# Patient Record
Sex: Female | Born: 1975 | Race: White | Hispanic: No | Marital: Married | Smoking: Never smoker
Health system: Southern US, Community
[De-identification: ages and names within clinical notes are randomized; demographics above are authoritative.]

## PROBLEM LIST (undated history)

## (undated) DIAGNOSIS — R011 Cardiac murmur, unspecified: Secondary | ICD-10-CM

## (undated) DIAGNOSIS — J309 Allergic rhinitis, unspecified: Secondary | ICD-10-CM

## (undated) DIAGNOSIS — I959 Hypotension, unspecified: Secondary | ICD-10-CM

## (undated) DIAGNOSIS — F329 Major depressive disorder, single episode, unspecified: Secondary | ICD-10-CM

## (undated) DIAGNOSIS — D509 Iron deficiency anemia, unspecified: Secondary | ICD-10-CM

## (undated) DIAGNOSIS — K589 Irritable bowel syndrome without diarrhea: Secondary | ICD-10-CM

## (undated) DIAGNOSIS — B001 Herpesviral vesicular dermatitis: Secondary | ICD-10-CM

## (undated) DIAGNOSIS — E785 Hyperlipidemia, unspecified: Secondary | ICD-10-CM

## (undated) DIAGNOSIS — N281 Cyst of kidney, acquired: Secondary | ICD-10-CM

## (undated) DIAGNOSIS — Z9189 Other specified personal risk factors, not elsewhere classified: Secondary | ICD-10-CM

## (undated) DIAGNOSIS — F3289 Other specified depressive episodes: Secondary | ICD-10-CM

## (undated) DIAGNOSIS — Z973 Presence of spectacles and contact lenses: Secondary | ICD-10-CM

## (undated) DIAGNOSIS — E669 Obesity, unspecified: Secondary | ICD-10-CM

## (undated) DIAGNOSIS — M129 Arthropathy, unspecified: Secondary | ICD-10-CM

## (undated) HISTORY — PX: BUNIONECTOMY: SHX129

## (undated) HISTORY — DX: Cardiac murmur, unspecified: R01.1

## (undated) HISTORY — DX: Irritable bowel syndrome, unspecified: K58.9

## (undated) HISTORY — DX: Arthropathy, unspecified: M12.9

## (undated) HISTORY — PX: NO PAST SURGERIES: SHX2092

## (undated) HISTORY — DX: Major depressive disorder, single episode, unspecified: F32.9

## (undated) HISTORY — DX: Allergic rhinitis, unspecified: J30.9

## (undated) HISTORY — DX: Hyperlipidemia, unspecified: E78.5

## (undated) HISTORY — DX: Other specified depressive episodes: F32.89

## (undated) HISTORY — DX: Other specified personal risk factors, not elsewhere classified: Z91.89

---
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
## 2007-09-18 ENCOUNTER — Encounter: Payer: Self-pay | Admitting: Internal Medicine

## 2008-11-11 ENCOUNTER — Encounter: Payer: Self-pay | Admitting: Internal Medicine

## 2009-04-01 ENCOUNTER — Encounter: Payer: Self-pay | Admitting: Internal Medicine

## 2009-04-01 LAB — CONVERTED CEMR LAB

## 2010-03-30 ENCOUNTER — Encounter: Payer: Self-pay | Admitting: Internal Medicine

## 2010-03-30 ENCOUNTER — Ambulatory Visit (INDEPENDENT_AMBULATORY_CARE_PROVIDER_SITE_OTHER): Payer: BC Managed Care – PPO | Admitting: Internal Medicine

## 2010-03-30 DIAGNOSIS — J309 Allergic rhinitis, unspecified: Secondary | ICD-10-CM | POA: Insufficient documentation

## 2010-03-30 DIAGNOSIS — R51 Headache: Secondary | ICD-10-CM

## 2010-03-30 DIAGNOSIS — M129 Arthropathy, unspecified: Secondary | ICD-10-CM | POA: Insufficient documentation

## 2010-03-30 DIAGNOSIS — E785 Hyperlipidemia, unspecified: Secondary | ICD-10-CM | POA: Insufficient documentation

## 2010-03-30 DIAGNOSIS — Z9189 Other specified personal risk factors, not elsewhere classified: Secondary | ICD-10-CM | POA: Insufficient documentation

## 2010-03-30 DIAGNOSIS — F329 Major depressive disorder, single episode, unspecified: Secondary | ICD-10-CM

## 2010-03-30 DIAGNOSIS — Z87448 Personal history of other diseases of urinary system: Secondary | ICD-10-CM | POA: Insufficient documentation

## 2010-03-30 DIAGNOSIS — Z309 Encounter for contraceptive management, unspecified: Secondary | ICD-10-CM

## 2010-03-30 DIAGNOSIS — F320 Major depressive disorder, single episode, mild: Secondary | ICD-10-CM | POA: Insufficient documentation

## 2010-04-02 ENCOUNTER — Telehealth (INDEPENDENT_AMBULATORY_CARE_PROVIDER_SITE_OTHER): Payer: Self-pay | Admitting: *Deleted

## 2010-04-08 ENCOUNTER — Encounter: Payer: Self-pay | Admitting: Internal Medicine

## 2010-04-09 NOTE — Progress Notes (Signed)
  Phone Note Other Incoming   Request: Send information Summary of Call: Records received from Dr. Para March. 12 pages forwarded to Dr. Felicity Coyer for review.

## 2010-04-09 NOTE — Assessment & Plan Note (Signed)
Summary: NEW/BCBS/PKG/#/STC   Vital Signs:  Patient profile:   35 year old female Height:      63 inches (160.02 cm) Weight:      162.0 pounds (73.64 kg) BMI:     28.80 O2 Sat:      97 % on Room air Temp:     98.3 degrees F (36.83 degrees C) oral Pulse rate:   81 / minute BP sitting:   118 / 84  (left arm) Cuff size:   regular  Vitals Entered By: Orlan Leavens RMA (March 30, 2010 3:11 PM)  O2 Flow:  Room air CC: New patient Is Patient Diabetic? No Pain Assessment Patient in pain? no        Primary Care Provider:  Newt Lukes MD  CC:  New patient.  History of Present Illness: new pt to me, here to est care prev at [facility] [place] [facility] (dr. Para March)  contraception needs - does not wish to take any hormone tx due to emotional irritability on BCP - has decided not to pursue pregnancy, no children desired - ?mechanical or surgical options for birth control - request 2nd opinion from new gyn on same  allg rhinits - follows with allg for same - reports compliance with ongoing medical treatment and no changes in medication dose or frequency. denies adverse side effects related to current therapy.   hx migraines/cluster headaches - prev on med saple for control of same but no rx ever filled - no recent falres but woul like refill available to use if needed  depression hx- on med tx for same temporary 2009 when physically separated from spouse (still in [place] after pt move to [place]), no rx since - has seen counselor for asst as needed - no SI/HI but current stressors due to contraception issues as above  Preventive Screening-Counseling & Management  Alcohol-Tobacco     Alcohol drinks/day: <1     Alcohol Counseling: not indicated; use of alcohol is not excessive or problematic     Smoking Status: never     Tobacco Counseling: not indicated; no tobacco use  Caffeine-Diet-Exercise     Caffeine Counseling: not indicated; caffeine use is not excessive or problematic  Nutrition Referrals: no     Does Patient Exercise: yes     Exercise Counseling: to improve exercise regimen     Depression Counseling: not indicated; screening negative for depression  Safety-Violence-Falls     Seat Belt Counseling: not indicated; patient wears seat belts     Helmet Counseling: not indicated; patient wears helmet when riding bicycle/motocycle     Firearm Counseling: not applicable     Violence Counseling: not indicated; no violence risk noted     Fall Risk Counseling: not indicated; no significant falls noted  Clinical Review Panels:  Prevention   Last Pap Smear:  Interpretation Result:Negative for intraepithelial Lesion or Malignancy.    (04/01/2009)   Current Medications (verified): 1)  Xyzal 2.5 Mg/34ml Soln (Levocetirizine Dihydrochloride) .... Take 1 By Mouth Once Daily 2)  Nasonex 50 Mcg/act Susp (Mometasone Furoate) .... Use 2 Spray Each Nostril Every Day  Allergies (verified): 1)  ! 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 I have reviewed all other systems and they were negative.   Physical Exam  General:  overweight-appearing.  alert, well-developed, well-nourished, and cooperative to examination.    Eyes:  left inward strabismus  - wears glasses - vision grossly intact; pupils equal, round and reactive to light.  conjunctiva and lids normal.    Ears:  R ear normal and L ear normal.   Mouth:  teeth and gums in good repair; mucous membranes moist, without lesions or ulcers. oropharynx clear without exudate, no erythema.  Neck:  supple, full ROM, no masses, no thyromegaly; no thyroid nodules or tenderness. no JVD or carotid bruits.   Lungs:  normal respiratory effort, no intercostal retractions or use of accessory muscles; normal breath sounds bilaterally - no crackles and no wheezes.    Heart:  normal rate, regular rhythm,  soft 2/6 syst murmur, and no rub. BLE without edema.  Abdomen:  soft, non-tender, normal bowel sounds, no distention; no masses and no appreciable hepatomegaly or splenomegaly.   Genitalia:  defer gyn Msk:  No deformity or scoliosis noted of thoracic or lumbar spine.   Neurologic:  alert & oriented X3 and cranial nerves II-XII symetrically intact.  strength normal in all extremities, sensation intact to light touch, and gait normal. speech fluent without dysarthria or aphasia; follows commands with good comprehension.  Skin:  no rashes, vesicles, ulcers, or erythema. No nodules or irregularity to palpation.  Psych:  reserved but oriented X3, memory intact for recent and remote, normally interactive, good eye contact, not anxious appearing, min depressed appearing, and not agitated.      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 Take 1 by mouth once daily    Nasonex 50 Mcg/act Susp (Mometasone furoate) ..... Use 2 spray each nostril every day Time spent with patient 35 minutes, more than 50% of this time was spent counseling patient on depression, contraception and review of prior med history -   Complete Medication List: 1)  Xyzal 2.5 Mg/65ml Soln (Levocetirizine dihydrochloride) .... Take 1 by mouth once daily 2)  Nasonex 50 Mcg/act Susp (Mometasone furoate) .... Use 2 spray each nostril every day  Patient Instructions: 1)  it was good to see you today.  2)  we'll make referral to gynecology to discuss nonpharm contraception options. Our office will contact you regarding this appointment once made.  3)  will send for records  from dr. Para March at [place] to review  4)  Please schedule a follow-up appointment in 3-6 months to continue review, call sooner if problems.    Orders Added: 1)  New Patient Level III [99203] 2)  Gynecologic Referral [Gyn]     Pap Smear  Procedure date:  04/01/2009  Findings:      Interpretation Result:Negative for intraepithelial Lesion or Malignancy.

## 2010-04-14 NOTE — Letter (Signed)
Summary: [facility]   Imported By: Sherian Rein 04/08/2010 11:11:13  _____________________________________________________________________  External Attachment:    Type:   Image     Comment:   External Document

## 2010-04-14 NOTE — Letter (Signed)
Summary: NP Rockwell Germany MD  NP Rockwell Germany MD   Imported By: Sherian Rein 04/08/2010 11:17:26  _____________________________________________________________________  External Attachment:    Type:   Image     Comment:   External Document

## 2010-04-21 NOTE — Letter (Signed)
Summary: Beather Arbour MD  Beather Arbour MD   Imported By: Lennie Odor 04/14/2010 10:25:27  _____________________________________________________________________  External Attachment:    Type:   Image     Comment:   External Document

## 2010-06-18 ENCOUNTER — Encounter: Payer: Self-pay | Admitting: Internal Medicine

## 2010-06-25 ENCOUNTER — Ambulatory Visit: Payer: BC Managed Care – PPO | Admitting: Internal Medicine

## 2010-07-03 ENCOUNTER — Ambulatory Visit (INDEPENDENT_AMBULATORY_CARE_PROVIDER_SITE_OTHER): Payer: BC Managed Care – PPO | Admitting: Internal Medicine

## 2010-07-03 ENCOUNTER — Encounter: Payer: Self-pay | Admitting: Internal Medicine

## 2010-07-03 VITALS — BP 108/62 | HR 77 | Temp 98.5°F | Resp 14 | Wt 156.8 lb

## 2010-07-03 DIAGNOSIS — I340 Nonrheumatic mitral (valve) insufficiency: Secondary | ICD-10-CM | POA: Insufficient documentation

## 2010-07-03 DIAGNOSIS — F329 Major depressive disorder, single episode, unspecified: Secondary | ICD-10-CM

## 2010-07-03 DIAGNOSIS — R011 Cardiac murmur, unspecified: Secondary | ICD-10-CM

## 2010-07-03 NOTE — Assessment & Plan Note (Signed)
Stable symptoms - continued insomnia - declines rx tx (ambein caused night terrors in past) - continue melatonin prn May be improved with job change (in effect today 07/03/10 at [employer] - study abroad project) Uses counseling as needed

## 2010-07-03 NOTE — Assessment & Plan Note (Signed)
hx same since childhood, "didn't outgrow" but unsure of dx - reports echo and stress test to be done every 3-44yr, last done in [place] (2008) - no chest pain, dyspnea on exertion or near syncope symptoms - refer for echo now

## 2010-07-03 NOTE — Progress Notes (Signed)
  Subjective:    Patient ID: Maria Frank, female    DOB: 08/31/1975, 35 y.o.   MRN: [id number]  HPI Here for follow up - reviewed chronic medical issues  contraception needs - working with gyn on same - ?IUD in planned fall 2012 as does not wish to take any hormone tx due to emotional irritability on BCP - has decided not to pursue pregnancy, no children desired -   allg rhinits - follows with allg for same - reports compliance with ongoing medical treatment and no changes in medication dose or frequency. denies adverse side effects related to current therapy.   hx migraines/cluster headaches - no recent flares but keeps refill available to use if needed   depression hx- on med tx for same temporary 2009 when physically separated from spouse (still in [place] after pt move to [place]), no rx since - has seen counselor for asst as needed - no SI/HI  - primary problem now with insomnia - hx ambien use caused night terrors - uses melatonin as needed  ?due for echo to follow up murmur - not done since 2008  Past Medical History  Diagnosis Date  . ARTHRITIS   . CHICKENPOX, HX OF   . Migraine   . IBS (irritable bowel syndrome)   . ALLERGIC RHINITIS   . HYPERLIPIDEMIA   . DEPRESSION    Review of Systems  Constitutional: Negative for fatigue.  Respiratory: Negative for cough and shortness of breath.   Cardiovascular: Negative for chest pain.  Neurological: Negative for headaches.       Objective:   Physical Exam BP 108/62  Pulse 77  Temp(Src) 98.5 F (36.9 C) (Oral)  Resp 14  Wt 156 lb 12 oz (71.101 kg)  SpO2 99%  LMP 06/12/2010 Physical Exam  Constitutional: She is oriented to person, place, and time. She appears well-developed and well-nourished. No distress.  Neck: Normal range of motion. Neck supple. No JVD present. No thyromegaly present.  Cardiovascular: Normal rate, regular rhythm and normal heart sounds.  2/6 systolic murmur heard. No BLE edema. Pulmonary/Chest: Effort normal  and breath sounds normal. No respiratory distress. She has no wheezes.  Psychiatric: She has a normal mood and affect. Her behavior is normal. Judgment and thought content normal.          Assessment & Plan:  See problem list. Medications and labs reviewed today.

## 2010-07-03 NOTE — Patient Instructions (Signed)
It was good to see you today. we'll make referral for echocardiogram for your heart murmur . Our office will contact you regarding appointment(s) once made. Please schedule followup in 3-6 months for physical/labs, call sooner if problems.

## 2010-07-16 ENCOUNTER — Other Ambulatory Visit (HOSPITAL_COMMUNITY): Payer: BC Managed Care – PPO | Admitting: Radiology

## 2010-07-20 ENCOUNTER — Ambulatory Visit (HOSPITAL_COMMUNITY): Payer: BC Managed Care – PPO | Attending: Internal Medicine | Admitting: Radiology

## 2010-07-20 DIAGNOSIS — R011 Cardiac murmur, unspecified: Secondary | ICD-10-CM

## 2010-07-20 DIAGNOSIS — I079 Rheumatic tricuspid valve disease, unspecified: Secondary | ICD-10-CM | POA: Insufficient documentation

## 2010-07-20 DIAGNOSIS — I059 Rheumatic mitral valve disease, unspecified: Secondary | ICD-10-CM | POA: Insufficient documentation

## 2010-07-20 DIAGNOSIS — E785 Hyperlipidemia, unspecified: Secondary | ICD-10-CM | POA: Insufficient documentation

## 2010-10-01 ENCOUNTER — Telehealth: Payer: Self-pay | Admitting: *Deleted

## 2010-10-01 DIAGNOSIS — Z0000 Encounter for general adult medical examination without abnormal findings: Secondary | ICD-10-CM

## 2010-10-01 NOTE — Telephone Encounter (Signed)
Need cpx labs entered in EPIC 

## 2010-10-17 ENCOUNTER — Other Ambulatory Visit: Payer: Self-pay | Admitting: Internal Medicine

## 2010-10-17 DIAGNOSIS — Z0000 Encounter for general adult medical examination without abnormal findings: Secondary | ICD-10-CM

## 2010-10-27 ENCOUNTER — Other Ambulatory Visit: Payer: BC Managed Care – PPO

## 2010-10-30 ENCOUNTER — Encounter: Payer: Self-pay | Admitting: Internal Medicine

## 2010-10-30 ENCOUNTER — Ambulatory Visit (INDEPENDENT_AMBULATORY_CARE_PROVIDER_SITE_OTHER): Payer: BC Managed Care – PPO | Admitting: Internal Medicine

## 2010-10-30 VITALS — BP 120/82 | HR 72 | Temp 98.3°F | Ht 62.0 in | Wt 153.1 lb

## 2010-10-30 DIAGNOSIS — Z23 Encounter for immunization: Secondary | ICD-10-CM

## 2010-10-30 DIAGNOSIS — Z0000 Encounter for general adult medical examination without abnormal findings: Secondary | ICD-10-CM

## 2010-10-30 NOTE — Progress Notes (Signed)
Subjective:    Patient ID: Maria Frank, female    DOB: Nov 28, 1975, 35 y.o.   MRN: [id number]  HPI  patient is here today for annual physical. Patient feels well and has no complaints.  Also reviewed chronic medical issues  contraception needs - working with gyn on same - IUD in planned fall 2012 as does not wish to take any hormone tx due to emotional irritability on BCP - has decided not to pursue pregnancy, no children desired -   allg rhinits - follows with allg for same - reports compliance with ongoing medical treatment and no changes in medication dose or frequency. denies adverse side effects related to current therapy.   hx migraines/cluster headaches - no recent flares but keeps refill available to use if needed   depression hx- on med tx for same temporary 2009 when physically separated from spouse (still in [place] after pt move to [place]), no rx since - has seen counselor for asst as needed - no SI/HI  - primary problem now with insomnia - hx ambien use caused night terrors - uses melatonin as needed  cardaic murmur - echo 07/2010 benign: Impressions: Normal LV size and function, EF 60-65%. Normal RV size and systolic function. Trivial MR. No significant valvular abnormalities.    Past Medical History  Diagnosis Date  . ARTHRITIS   . CHICKENPOX, HX OF   . Migraine   . IBS (irritable bowel syndrome)   . ALLERGIC RHINITIS   . HYPERLIPIDEMIA   . DEPRESSION    Family History  Problem Relation Age of Onset  . Hypertension Father   . Hypertension Paternal Grandfather   . Arthritis Other     parent & grandparents   History  Substance Use Topics  . Smoking status: Never Smoker   . Smokeless tobacco: Not on file   Comment: Married, lives with spouse. Originally from [place], in [place] since 2008. 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 Alcohol Use: Yes     Social    Review of Systems  Constitutional: Negative for fatigue.  Respiratory: Negative for cough and shortness of  breath.   Cardiovascular: Negative for chest pain.  Neurological: Negative for headaches.  Gastrointestinal: Negative for abdominal pain.  Musculoskeletal: Negative for gait problem.  Skin: Negative for rash.  No other specific complaints in a complete review of systems (except as listed in HPI above).      Objective:   Physical Exam  BP 120/82  Pulse 72  Temp(Src) 98.3 F (36.8 C) (Oral)  Ht 5\' 2"  (1.575 m)  Wt 153 lb 1.9 oz (69.455 kg)  BMI 28.01 kg/m2  SpO2 97% Wt Readings from Last 3 Encounters:  10/30/10 153 lb 1.9 oz (69.455 kg)  07/03/10 156 lb 12 oz (71.101 kg)  03/30/10 162 lb (73.483 kg)    Constitutional: She is overweight (mild); appears well-developed and well-nourished. No distress.  HENT: Head: Normocephalic and atraumatic. Ears: B TMs ok, no erythema or effusion; Nose: Nose normal.  Mouth/Throat: Oropharynx is clear and moist. No oropharyngeal exudate.  Eyes: wears corrective lenses, Conjunctivae and EOM are normal. Pupils are equal, round, and reactive to light. No scleral icterus.  Neck: Normal range of motion. Neck supple. No JVD or LAD present. No thyromegaly present.  Cardiovascular: Normal rate, regular rhythm and normal heart sounds.  Chronic soft 2/6 syst murmur heard. No BLE edema. Pulmonary/Chest: Effort normal and breath sounds normal. No respiratory distress. She has no wheezes.  Abdominal: Soft. Bowel sounds  are normal. She exhibits no distension. There is no tenderness. no masses GU/pelvic: defer to gyn Musculoskeletal: Normal range of motion, no joint effusions. No gross deformities Neurological: She is alert and oriented to person, place, and time. No cranial nerve deficit. Coordination normal.  Skin: Skin is warm and dry. No rash noted. No erythema.  Psychiatric: She has a normal mood and affect. Her behavior is normal. Judgment and thought content normal.   No results found for this basename: WBC, HGB, HCT, PLT, CHOL, TRIG, HDL, LDLDIRECT,  ALT, AST, NA, K, CL, CREATININE, BUN, CO2, TSH, PSA, INR, GLUF, HGBA1C, MICROALBUR       Assessment & Plan:  CPX - v70.0 - Patient has been counseled on age-appropriate routine health concerns for screening and prevention. These are reviewed and up-to-date. Immunizations are up-to-date or declined. Labs ordered and will be reviewed.

## 2010-10-30 NOTE — Patient Instructions (Signed)
It was good to see you today. Test(s) ordered today. Your results will be called to you after review (48-72hours after test completion). If any changes need to be made, you will be notified at that time. Exam looks great! Keep active and continue to live healthy. Immunizations updated today - Please schedule followup in 1-2 years for physical and labs, call sooner if problems.

## 2010-11-17 ENCOUNTER — Other Ambulatory Visit (INDEPENDENT_AMBULATORY_CARE_PROVIDER_SITE_OTHER): Payer: BC Managed Care – PPO

## 2010-11-17 DIAGNOSIS — Z0000 Encounter for general adult medical examination without abnormal findings: Secondary | ICD-10-CM

## 2010-11-17 LAB — HEPATIC FUNCTION PANEL
ALT: 15 U/L (ref 0–35)
AST: 19 U/L (ref 0–37)
Albumin: 4.1 g/dL (ref 3.5–5.2)
Alkaline Phosphatase: 56 U/L (ref 39–117)
Total Protein: 7.4 g/dL (ref 6.0–8.3)

## 2010-11-17 LAB — BASIC METABOLIC PANEL
CO2: 25 mEq/L (ref 19–32)
Calcium: 8.8 mg/dL (ref 8.4–10.5)
Creatinine, Ser: 0.7 mg/dL (ref 0.4–1.2)

## 2010-11-17 LAB — LIPID PANEL
Cholesterol: 180 mg/dL (ref 0–200)
Total CHOL/HDL Ratio: 3
Triglycerides: 71 mg/dL (ref 0.0–149.0)

## 2010-11-17 LAB — CBC WITH DIFFERENTIAL/PLATELET
Basophils Absolute: 0 10*3/uL (ref 0.0–0.1)
Basophils Relative: 0.5 % (ref 0.0–3.0)
Eosinophils Absolute: 0 10*3/uL (ref 0.0–0.7)
Lymphocytes Relative: 33.6 % (ref 12.0–46.0)
MCHC: 33.4 g/dL (ref 30.0–36.0)
Neutrophils Relative %: 58 % (ref 43.0–77.0)
RBC: 4.26 Mil/uL (ref 3.87–5.11)

## 2010-11-17 LAB — URINALYSIS
Leukocytes, UA: NEGATIVE
Nitrite: NEGATIVE
Specific Gravity, Urine: <=1.005 (ref 1.000–1.030)
Urine Glucose: NEGATIVE
Urobilinogen, UA: 0.2 (ref 0.0–1.0)

## 2010-11-17 LAB — TSH: TSH: 1.84 u[IU]/mL (ref 0.35–5.50)

## 2011-08-04 ENCOUNTER — Encounter: Payer: Self-pay | Admitting: Internal Medicine

## 2011-08-04 ENCOUNTER — Ambulatory Visit (INDEPENDENT_AMBULATORY_CARE_PROVIDER_SITE_OTHER): Payer: BC Managed Care – PPO | Admitting: Internal Medicine

## 2011-08-04 VITALS — BP 120/82 | HR 69 | Temp 98.4°F | Ht 64.75 in | Wt 162.4 lb

## 2011-08-04 DIAGNOSIS — M7062 Trochanteric bursitis, left hip: Secondary | ICD-10-CM

## 2011-08-04 DIAGNOSIS — M76899 Other specified enthesopathies of unspecified lower limb, excluding foot: Secondary | ICD-10-CM

## 2011-08-04 NOTE — Patient Instructions (Signed)
It was good to see you today. We have injected your greater trochanteric bursa today with steroids - Place ice to injection site for 5 minutes every 6 hours for next 24-48h, then as needed Trochanteric Bursitis You have hip pain due to trochanteric bursitis. Bursitis means that the sack near the outside of the hip is filled with fluid and inflamed. This sack is made up of protective soft tissue. The pain from trochanteric bursitis can be severe and keep you from sleep. It can radiate to the buttocks or down the outside of the thigh to the knee. The pain is almost always worse when rising from the seated or lying position and with walking. Pain can improve after you take a few steps. It happens more often in people with hip joint and lumbar spine problems, such as arthritis or previous surgery. Very rarely the trochanteric bursa can become infected, and antibiotics and/or surgery may be needed. Treatment often includes an injection of local anesthetic mixed with cortisone medicine. This medicine is injected into the area where it is most tender over the hip. Repeat injections may be necessary if the response to treatment is slow. You can apply ice packs over the tender area for 30 minutes every 2 hours for the next few days. Anti-inflammatory and/or narcotic pain medicine may also be helpful. Limit your activity for the next few days if the pain continues. See your caregiver in 5-10 days if you are not greatly improved.   SEEK IMMEDIATE MEDICAL CARE IF:  You develop severe pain, fever, or increased redness.   You have pain that radiates below the knee.  EXERCISES STRETCHING EXERCISES - Trochantic Bursitis  These exercises may help you when beginning to rehabilitate your injury. Your symptoms may resolve with or without further involvement from your physician, physical therapist or athletic trainer. While completing these exercises, remember:    Restoring tissue flexibility helps normal motion to return  to the joints. This allows healthier, less painful movement and activity.   An effective stretch should be held for at least 30 seconds.   A stretch should never be painful. You should only feel a gentle lengthening or release in the stretched tissue.  STRETCH - Iliotibial Band  On the floor or bed, lie on your side so your injured leg is on top. Bend your knee and grab your ankle.   Slowly bring your knee back so that your thigh is in line with your trunk. Keep your heel at your buttocks and gently arch your back so your head, shoulders and hips line up.   Slowly lower your leg so that your knee approaches the floor/bed until you feel a gentle stretch on the outside of your thigh. If you do not feel a stretch and your knee will not fall farther, place the heel of your opposite foot on top of your knee and pull your thigh down farther.   Hold this stretch for __________ seconds.   Repeat __________ times. Complete this exercise __________ times per day.  STRETCH - Hamstrings, Supine   Lie on your back. Loop a belt or towel over the ball of your foot as shown.   Straighten your knee and slowly pull on the belt to raise your injured leg. Do not allow the knee to bend. Keep your opposite leg flat on the floor.   Raise the leg until you feel a gentle stretch behind your knee or thigh. Hold this position for __________ seconds.   Repeat __________ times.  Complete this stretch __________ times per day.  STRETCH - Quadriceps, Prone   Lie on your stomach on a firm surface, such as a bed or padded floor.   Bend your knee and grasp your ankle. If you are unable to reach, your ankle or pant leg, use a belt around your foot to lengthen your reach.   Gently pull your heel toward your buttocks. Your knee should not slide out to the side. You should feel a stretch in the front of your thigh and/or knee.   Hold this position for __________ seconds.   Repeat __________ times. Complete this stretch  __________ times per day.  STRETCHING - Hip Flexors, Lunge Half kneel with your knee on the floor and your opposite knee bent and directly over your ankle.  Keep good posture with your head over your shoulders. Tighten your buttocks to point your tailbone downward; this will prevent your back from arching too much.   You should feel a gentle stretch in the front of your thigh and/or hip. If you do not feel any resistance, slightly slide your opposite foot forward and then slowly lunge forward so your knee once again lines up over your ankle. Be sure your tailbone remains pointed downward.   Hold this stretch for __________ seconds.   Repeat __________ times. Complete this stretch __________ times per day.  STRETCH - Adductors, Lunge  While standing, spread your legs   Lean away from your injured leg by bending your opposite knee. You may rest your hands on your thigh for balance.   You should feel a stretch in your inner thigh. Hold for __________ seconds.   Repeat __________ times. Complete this exercise __________ times per day.  Document Released: 02/26/2004 Document Revised: 01/07/2011 Document Reviewed: 05/02/2008 [facility] Patient Information 2012 [place], [place].Joint Injection Care After Refer to this sheet in the next few days. These instructions provide you with information on caring for yourself after you have had a joint injection. Your caregiver also may give you more specific instructions. Your treatment has been planned according to current medical practices, but problems sometimes occur. Call your caregiver if you have any problems or questions after your procedure. After any type of joint injection, it is not uncommon to experience:  Soreness, swelling, or bruising around the injection site.   Mild numbness, tingling, or weakness around the injection site caused by the numbing medicine used before or with the injection.  It also is possible to experience the following  effects associated with the specific agent after injection:  Iodine-based contrast agents:   Allergic reaction (itching, hives, widespread redness, and swelling beyond the injection site).   Corticosteroids (These effects are rare.):   Allergic reaction.   Increased blood sugar levels (If you have diabetes and you notice that your blood sugar levels have increased, notify your caregiver).   Increased blood pressure levels.   Mood swings.   Hyaluronic acid in the use of viscosupplementation.   Temporary heat or redness.   Temporary rash and itching.   Increased fluid accumulation in the injected joint.  These effects all should resolve within a day after your procedure.   HOME CARE INSTRUCTIONS  Limit yourself to light activity the day of your procedure. Avoid lifting heavy objects, bending, stooping, or twisting.   Take prescription or over-the-counter pain medication as directed by your caregiver.   You may apply ice to your injection site to reduce pain and swelling the day of your procedure. Ice may be  applied 3 to 4 times:   Put ice in a plastic bag.   Place a towel between your skin and the bag.   Leave the ice on for no longer than 15 to 20 minutes each time.  SEEK IMMEDIATE MEDICAL CARE IF:    Pain and swelling get worse rather than better or extend beyond the injection site.   Numbness does not go away.   Blood or fluid continues to leak from the injection site.   You have chest pain.   You have swelling of your face or tongue.   You have trouble breathing or you become dizzy.   You develop a fever, chills, or severe tenderness at the injection site that last longer than 1 day.  MAKE SURE YOU:  Understand these instructions.   Watch your condition.   Get help right away if you are not doing well or if you get worse.  Document Released: 10/01/2010 Document Revised: 01/07/2011 Document Reviewed: 10/01/2010 [facility] Patient Information 2012 [place],  [place].

## 2011-08-04 NOTE — Progress Notes (Signed)
  Subjective:    Patient ID: Maria Frank, female    DOB: 1975-09-19, 36 y.o.   MRN: [id number]  HPI  complains of L hip pain Ongoing for 3-4 mo with intermittent pain, became constant 2 weeks ago Pain worst with direct pressure such as lying in bed No injury, trauma or falls No radiation into leg or groin No back pain   Past Medical History  Diagnosis Date  . ARTHRITIS   . CHICKENPOX, HX OF   . Migraine   . IBS (irritable bowel syndrome)   . ALLERGIC RHINITIS   . HYPERLIPIDEMIA   . DEPRESSION     Review of Systems  Constitutional: Negative for fever and fatigue.  Respiratory: Negative for cough and shortness of breath.   Cardiovascular: Negative for chest pain.  Musculoskeletal: Negative for back pain, joint swelling and gait problem.       Objective:   Physical Exam BP 120/82  Pulse 69  Temp 98.4 F (36.9 C) (Oral)  Ht 5' 4.75" (1.645 m)  Wt 162 lb 6.4 oz (73.664 kg)  BMI 27.23 kg/m2  SpO2 98% Constitutional: She appears well-developed and well-nourished. No distress.  Musculoskeletal: tender to palpation over left troch bursa - hip with normal range of motion. No gross deformities Skin: Skin is warm and dry. No rash noted. No erythema.  Psychiatric: She has a normal mood and affect. Her behavior is normal. Judgment and thought content normal.      Procedure Note:  Greater trochanteric bursa injection the patient elects to proceed after verbal consent is obtained. the patient informed of possible risks and complications prior to procedure. Using sterile technique throughout, patient is injected with 1:3 DepoMedrol (40 mg):xylocaine into trochanteric bursa at site of maximal tenderness. the patient tolerated the procedure well. Ice 24-48h, heat thereafter as needed instructions aftercare provided.      Assessment & Plan:  greater troch bursitis - steroid injection today - education on dx and aftercare instructions provided

## 2012-02-17 ENCOUNTER — Ambulatory Visit (INDEPENDENT_AMBULATORY_CARE_PROVIDER_SITE_OTHER): Payer: BC Managed Care – PPO | Admitting: Internal Medicine

## 2012-02-17 ENCOUNTER — Encounter: Payer: Self-pay | Admitting: Internal Medicine

## 2012-02-17 VITALS — BP 122/88 | HR 77 | Temp 98.1°F | Ht 64.75 in | Wt 164.8 lb

## 2012-02-17 DIAGNOSIS — R011 Cardiac murmur, unspecified: Secondary | ICD-10-CM

## 2012-02-17 DIAGNOSIS — J019 Acute sinusitis, unspecified: Secondary | ICD-10-CM

## 2012-02-17 DIAGNOSIS — J309 Allergic rhinitis, unspecified: Secondary | ICD-10-CM

## 2012-02-17 MED ORDER — AZITHROMYCIN 250 MG PO TABS: ORAL_TABLET | ORAL | Status: DC

## 2012-02-17 MED ORDER — PROMETHAZINE-PHENYLEPHRINE 6.25-5 MG/5ML PO SYRP: 5.0000 mL | ORAL_SOLUTION | ORAL | Status: DC | PRN

## 2012-02-17 NOTE — Progress Notes (Signed)
  Subjective:   HPI  complains of head cold symptoms, ?sinusitus Onset >2 week ago, initially improved then relapsing and worse symptoms  First associated with sore throat, mild headache, facial pain, L ear pain and low grade fever Now increasing sinus pressure and mod nasal congestion, yellow-green with blood tingled discharge No relief with OTC meds Precipitated by allergens, sick contacts and weather change  Also needs paperwork completed in regards to hx cardiac murmur - no shortness of breath, edema, palpitations   Past Medical History  Diagnosis Date  . ARTHRITIS   . CHICKENPOX, HX OF   . Migraine   . IBS (irritable bowel syndrome)   . ALLERGIC RHINITIS   . HYPERLIPIDEMIA   . DEPRESSION     Review of Systems Constitutional: No night sweats, no unexpected weight change Pulmonary: No pleurisy or hemoptysis Cardiovascular: No chest pain or palpitations     Objective:   Physical Exam BP 122/88  Pulse 77  Temp 98.1 F (36.7 C) (Oral)  Ht 5' 4.75" (1.645 m)  Wt 164 lb 12 oz (74.73 kg)  BMI 27.63 kg/m2  SpO2 97%  LMP 02/10/2012  GEN: mildly ill appearing and audible head congestion HENT: NCAT, mild sinus tenderness bilaterally, nares with thick discharge and turbinate swelling, oropharynx mild erythema and PND, no exudate Eyes: Vision grossly intact, no conjunctivitis Lungs: Clear to auscultation without rhonchi or wheeze, no increased work of breathing Cardiovascular: 1/6 murmur without radiation; Regular rate and rhythm, no bilateral edema  Lab Results  Component Value Date   WBC 6.8 11/17/2010   HGB 13.7 11/17/2010   HCT 41.1 11/17/2010   PLT 192.0 11/17/2010   GLUCOSE 80 11/17/2010   CHOL 180 11/17/2010   TRIG 71.0 11/17/2010   HDL 56.50 11/17/2010   LDLCALC 109* 11/17/2010   ALT 15 11/17/2010   AST 19 11/17/2010   NA 137 11/17/2010   K 3.9 11/17/2010   CL 106 11/17/2010   CREATININE 0.7 11/17/2010   BUN 10 11/17/2010   CO2 25 11/17/2010   TSH  1.84 11/17/2010      Assessment & Plan:  Viral URI > progression to acute sinusitis allergic rhinitis  Cough, postnasal drip related to above Trivial MR/MVP - echo 07/2010 reviewed - no clinically significant disease - no need for follow up in absence of symptoms   Empiric antibiotics prescribed due to symptom duration greater than 7 days and progression despite OTC symptomatic care Prescription cough suppression - new prescriptions done Symptomatic care with Tylenol or Advil, decongestants, antihistamine, hydration and rest -  Saline irrigation and salt gargle advised as needed  Paperwork for insurance application completed as requested re: MR

## 2012-02-17 NOTE — Assessment & Plan Note (Signed)
hx same since childhood, "didn't outgrow" reports echo and stress test previously done every 3-87yr in [place]  no chest pain, dyspnea on exertion or near syncope symptoms Trivial MR/MVP - echo 07/2010 reviewed - no clinically significant disease -  no need for follow up in absence of symptoms

## 2012-02-17 NOTE — Patient Instructions (Signed)
It was good to see you today. Zpak antibiotics and prescription cough/decongestant syrup - Your prescription(s) have been submitted to your pharmacy. Please take as directed and contact our office if you believe you are having problem(s) with the medication(s). Alternate between ibuprofen and tylenol for aches, pain and fever symptoms as discussed Use saline irrigation or nasal saline spray as discussed Hydrate, rest and call if worse or unimproved  Paperwork for insurance application completed and returned today as requests

## 2012-10-09 ENCOUNTER — Ambulatory Visit (INDEPENDENT_AMBULATORY_CARE_PROVIDER_SITE_OTHER): Payer: BC Managed Care – PPO

## 2012-10-09 ENCOUNTER — Ambulatory Visit (INDEPENDENT_AMBULATORY_CARE_PROVIDER_SITE_OTHER): Payer: BC Managed Care – PPO | Admitting: Internal Medicine

## 2012-10-09 ENCOUNTER — Encounter: Payer: Self-pay | Admitting: Internal Medicine

## 2012-10-09 VITALS — BP 110/80 | HR 83 | Temp 98.5°F | Ht 62.0 in | Wt 165.0 lb

## 2012-10-09 DIAGNOSIS — F329 Major depressive disorder, single episode, unspecified: Secondary | ICD-10-CM

## 2012-10-09 DIAGNOSIS — Z23 Encounter for immunization: Secondary | ICD-10-CM

## 2012-10-09 DIAGNOSIS — Z0000 Encounter for general adult medical examination without abnormal findings: Secondary | ICD-10-CM

## 2012-10-09 DIAGNOSIS — Z124 Encounter for screening for malignant neoplasm of cervix: Secondary | ICD-10-CM

## 2012-10-09 DIAGNOSIS — F3289 Other specified depressive episodes: Secondary | ICD-10-CM

## 2012-10-09 LAB — URINALYSIS, ROUTINE W REFLEX MICROSCOPIC
Nitrite: NEGATIVE
Urine Glucose: NEGATIVE
Urobilinogen, UA: 0.2 (ref 0.0–1.0)

## 2012-10-09 LAB — CBC WITH DIFFERENTIAL/PLATELET
Eosinophils Absolute: 0 10*3/uL (ref 0.0–0.7)
MCHC: 33.7 g/dL (ref 30.0–36.0)
MCV: 93 fl (ref 78.0–100.0)
Monocytes Absolute: 0.6 10*3/uL (ref 0.1–1.0)
Neutrophils Relative %: 63.6 % (ref 43.0–77.0)
Platelets: 233 10*3/uL (ref 150.0–400.0)

## 2012-10-09 LAB — BASIC METABOLIC PANEL
BUN: 12 mg/dL (ref 6–23)
CO2: 26 mEq/L (ref 19–32)
Chloride: 107 mEq/L (ref 96–112)
Creatinine, Ser: 0.7 mg/dL (ref 0.4–1.2)

## 2012-10-09 LAB — HEPATIC FUNCTION PANEL
Alkaline Phosphatase: 52 U/L (ref 39–117)
Bilirubin, Direct: 0.1 mg/dL (ref 0.0–0.3)

## 2012-10-09 LAB — TSH: TSH: 2.12 u[IU]/mL (ref 0.35–5.50)

## 2012-10-09 LAB — LIPID PANEL
Total CHOL/HDL Ratio: 3
VLDL: 18 mg/dL (ref 0.0–40.0)

## 2012-10-09 MED ORDER — ALPRAZOLAM 0.5 MG PO TABS: 0.2500 mg | ORAL_TABLET | Freq: Three times a day (TID) | ORAL | Status: DC | PRN

## 2012-10-09 NOTE — Assessment & Plan Note (Signed)
Exacerbated symptoms due to strain on marriage chronic insomnia - declines rx tx (ambein caused night terrors in past) - continue melatonin prn Ok for low dose xanax as needed Continue ongoing counseling

## 2012-10-09 NOTE — Progress Notes (Signed)
Subjective:   HPI  patient is here today for annual physical. Patient feels well and has no complaints.  Reports "stress" with marriage - planning to move out, in couples counseling now   Past Medical History  Diagnosis Date  . ARTHRITIS   . CHICKENPOX, HX OF   . Migraine   . IBS (irritable bowel syndrome)   . ALLERGIC RHINITIS   . HYPERLIPIDEMIA   . DEPRESSION    Family History  Problem Relation Age of Onset  . Hypertension Father   . Hypertension Paternal Grandfather   . Arthritis Other     parent & grandparents  . Breast cancer Maternal Aunt 65    also cousin age 29 dx   History  Substance Use Topics  . Smoking status: Never Smoker   . Smokeless tobacco: Not on file     Comment: Married, lives with spouse. Originally from [place], in [place] since 2008. 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 Alcohol Use: Yes     Comment: Social    Review of Systems Constitutional: Negative for fever or weight change.  Respiratory: Negative for cough and shortness of breath.   Cardiovascular: Negative for chest pain or palpitations.  Gastrointestinal: Negative for abdominal pain, no bowel changes.  Musculoskeletal: Negative for gait problem or joint swelling.  Skin: Negative for rash.  Neurological: Negative for dizziness or headache.  No other specific complaints in a complete review of systems (except as listed in HPI above).     Objective:   Physical Exam BP 110/80  Pulse 83  Temp(Src) 98.5 F (36.9 C) (Oral)  Ht 5\' 2"  (1.575 m)  Wt 165 lb (74.844 kg)  BMI 30.17 kg/m2  SpO2 98% Wt Readings from Last 3 Encounters:  10/09/12 165 lb (74.844 kg)  02/17/12 164 lb 12 oz (74.73 kg)  08/04/11 162 lb 6.4 oz (73.664 kg)   Constitutional: She is overweight, but appears well-developed and well-nourished. No distress.  HENT: Head: Normocephalic and atraumatic. Ears: B TMs ok, no erythema or effusion; Nose: Nose normal. Mouth/Throat: Oropharynx is clear and moist. No  oropharyngeal exudate.  Eyes: Conjunctivae and EOM are normal. Pupils are equal, round, and reactive to light. No scleral icterus.  Neck: Normal range of motion. Neck supple. No JVD present. No thyromegaly present.  Cardiovascular: Normal rate, regular rhythm and normal heart sounds.  Soft 1/6 syst murmur heard. No BLE edema. Pulmonary/Chest: Effort normal and breath sounds normal. No respiratory distress. She has no wheezes.  Abdominal: Soft. Bowel sounds are normal. She exhibits no distension. There is no tenderness. no masses Musculoskeletal: Normal range of motion, no joint effusions. No gross deformities Neurological: She is alert and oriented to person, place, and time. No cranial nerve deficit. Coordination, balance, strength, speech and gait are normal.  Skin: Skin is warm and dry. No rash noted. No erythema.  Psychiatric: She has a mildly dysphoric mood and affect. Her behavior is normal. Judgment and thought content normal.     Lab Results  Component Value Date   WBC 6.8 11/17/2010   HGB 13.7 11/17/2010   HCT 41.1 11/17/2010   PLT 192.0 11/17/2010   GLUCOSE 80 11/17/2010   CHOL 180 11/17/2010   TRIG 71.0 11/17/2010   HDL 56.50 11/17/2010   LDLCALC 109* 11/17/2010   ALT 15 11/17/2010   AST 19 11/17/2010   NA 137 11/17/2010   K 3.9 11/17/2010   CL 106 11/17/2010   CREATININE 0.7 11/17/2010   BUN 10 11/17/2010  CO2 25 11/17/2010   TSH 1.84 11/17/2010      Assessment & Plan:   CPX/v70.0 - Patient has been counseled on age-appropriate routine health concerns for screening and prevention. These are reviewed and up-to-date. Immunizations are up-to-date or declined. Labs ordered and reviewed.  Also see problem list. Medications and labs reviewed today.

## 2012-10-09 NOTE — Patient Instructions (Addendum)
It was good to see you today. Health Maintenance reviewed - all recommended immunizations and age-appropriate screenings are up-to-date. Your annual flu shot was given and/or updated today. Test(s) ordered today. Your results will be released to MyChart (or called to you) after review, usually within 72hours after test completion. If any changes need to be made, you will be notified at that same time. Medications reviewed and updated, ok for low dose alprazolam (generic Xanax) as needed for "nerves" -no other changes recommended at this time. Your prescription(s) have been submitted to your pharmacy. Please take as directed and contact our office if you believe you are having problem(s) with the medication(s). Please schedule followup in 6-12 months for mood check and review, call sooner if problems. Health Maintenance, Females A healthy lifestyle and preventative care can promote health and wellness.  Maintain regular health, dental, and eye exams.  Eat a healthy diet. Foods like vegetables, fruits, whole grains, low-fat dairy products, and lean protein foods contain the nutrients you need without too many calories. Decrease your intake of foods high in solid fats, added sugars, and salt. Get information about a proper diet from your caregiver, if necessary.  Regular physical exercise is one of the most important things you can do for your health. Most adults should get at least 150 minutes of moderate-intensity exercise (any activity that increases your heart rate and causes you to sweat) each week. In addition, most adults need muscle-strengthening exercises on 2 or more days a week.   Maintain a healthy weight. The body mass index (BMI) is a screening tool to identify possible weight problems. It provides an estimate of body fat based on height and weight. Your caregiver can help determine your BMI, and can help you achieve or maintain a healthy weight. For adults 20 years and older:  A BMI  below 18.5 is considered underweight.  A BMI of 18.5 to 24.9 is normal.  A BMI of 25 to 29.9 is considered overweight.  A BMI of 30 and above is considered obese.  Maintain normal blood lipids and cholesterol by exercising and minimizing your intake of saturated fat. Eat a balanced diet with plenty of fruits and vegetables. Blood tests for lipids and cholesterol should begin at age 65 and be repeated every 5 years. If your lipid or cholesterol levels are high, you are over 50, or you are a high risk for heart disease, you may need your cholesterol levels checked more frequently.Ongoing high lipid and cholesterol levels should be treated with medicines if diet and exercise are not effective.  If you smoke, find out from your caregiver how to quit. If you do not use tobacco, do not start.  If you are pregnant, do not drink alcohol. If you are breastfeeding, be very cautious about drinking alcohol. If you are not pregnant and choose to drink alcohol, do not exceed 1 drink per day. One drink is considered to be 12 ounces (355 mL) of beer, 5 ounces (148 mL) of wine, or 1.5 ounces (44 mL) of liquor.  Avoid use of street drugs. Do not share needles with anyone. Ask for help if you need support or instructions about stopping the use of drugs.  High blood pressure causes heart disease and increases the risk of stroke. Blood pressure should be checked at least every 1 to 2 years. Ongoing high blood pressure should be treated with medicines, if weight loss and exercise are not effective.  If you are 49 to 38 years old,  ask your caregiver if you should take aspirin to prevent strokes.  Diabetes screening involves taking a blood sample to check your fasting blood sugar level. This should be done once every 3 years, after age 34, if you are within normal weight and without risk factors for diabetes. Testing should be considered at a younger age or be carried out more frequently if you are overweight and have  at least 1 risk factor for diabetes.  Breast cancer screening is essential preventative care for women. You should practice "breast self-awareness." This means understanding the normal appearance and feel of your breasts and may include breast self-examination. Any changes detected, no matter how small, should be reported to a caregiver. Women in their 2s and 30s should have a clinical breast exam (CBE) by a caregiver as part of a regular health exam every 1 to 3 years. After age 3, women should have a CBE every year. Starting at age 82, women should consider having a mammogram (breast X-ray) every year. Women who have a family history of breast cancer should talk to their caregiver about genetic screening. Women at a high risk of breast cancer should talk to their caregiver about having an MRI and a mammogram every year.  The Pap test is a screening test for cervical cancer. Women should have a Pap test starting at age 88. Between ages 47 and 40, Pap tests should be repeated every 2 years. Beginning at age 11, you should have a Pap test every 3 years as long as the past 3 Pap tests have been normal. If you had a hysterectomy for a problem that was not cancer or a condition that could lead to cancer, then you no longer need Pap tests. If you are between ages 47 and 70, and you have had normal Pap tests going back 10 years, you no longer need Pap tests. If you have had past treatment for cervical cancer or a condition that could lead to cancer, you need Pap tests and screening for cancer for at least 20 years after your treatment. If Pap tests have been discontinued, risk factors (such as a new sexual partner) need to be reassessed to determine if screening should be resumed. Some women have medical problems that increase the chance of getting cervical cancer. In these cases, your caregiver may recommend more frequent screening and Pap tests.  The human papillomavirus (HPV) test is an additional test that may  be used for cervical cancer screening. The HPV test looks for the virus that can cause the cell changes on the cervix. The cells collected during the Pap test can be tested for HPV. The HPV test could be used to screen women aged 38 years and older, and should be used in women of any age who have unclear Pap test results. After the age of 86, women should have HPV testing at the same frequency as a Pap test.  Colorectal cancer can be detected and often prevented. Most routine colorectal cancer screening begins at the age of 21 and continues through age 69. However, your caregiver may recommend screening at an earlier age if you have risk factors for colon cancer. On a yearly basis, your caregiver may provide home test kits to check for hidden blood in the stool. Use of a small camera at the end of a tube, to directly examine the colon (sigmoidoscopy or colonoscopy), can detect the earliest forms of colorectal cancer. Talk to your caregiver about this at age 44, when routine  screening begins. Direct examination of the colon should be repeated every 5 to 10 years through age 62, unless early forms of pre-cancerous polyps or small growths are found.  Hepatitis C blood testing is recommended for all people born from [birth year] through 1965 and any individual with known risks for hepatitis C.  Practice safe sex. Use condoms and avoid high-risk sexual practices to reduce the spread of sexually transmitted infections (STIs). Sexually active women aged 52 and younger should be checked for Chlamydia, which is a common sexually transmitted infection. Older women with new or multiple partners should also be tested for Chlamydia. Testing for other STIs is recommended if you are sexually active and at increased risk.  Osteoporosis is a disease in which the bones lose minerals and strength with aging. This can result in serious bone fractures. The risk of osteoporosis can be identified using a bone density scan. Women ages 40  and over and women at risk for fractures or osteoporosis should discuss screening with their caregivers. Ask your caregiver whether you should be taking a calcium supplement or vitamin D to reduce the rate of osteoporosis.  Menopause can be associated with physical symptoms and risks. Hormone replacement therapy is available to decrease symptoms and risks. You should talk to your caregiver about whether hormone replacement therapy is right for you.  Use sunscreen with a sun protection factor (SPF) of 30 or greater. Apply sunscreen liberally and repeatedly throughout the day. You should seek shade when your shadow is shorter than you. Protect yourself by wearing long sleeves, pants, a wide-brimmed hat, and sunglasses year round, whenever you are outdoors.  Notify your caregiver of new moles or changes in moles, especially if there is a change in shape or color. Also notify your caregiver if a mole is larger than the size of a pencil eraser.  Stay current with your immunizations. Document Released: 08/03/2010 Document Revised: 04/12/2011 Document Reviewed: 08/03/2010 [facility] Patient Information 2014 [place], [place].

## 2012-10-16 ENCOUNTER — Encounter (HOSPITAL_COMMUNITY): Payer: Self-pay | Admitting: *Deleted

## 2012-10-16 ENCOUNTER — Emergency Department (HOSPITAL_COMMUNITY): Payer: BC Managed Care – PPO

## 2012-10-16 ENCOUNTER — Emergency Department (HOSPITAL_COMMUNITY)
Admission: EM | Admit: 2012-10-16 | Discharge: 2012-10-16 | Disposition: A | Discharge status: Home / Self Care | Payer: BC Managed Care – PPO | Attending: Emergency Medicine | Admitting: Emergency Medicine

## 2012-10-16 DIAGNOSIS — F329 Major depressive disorder, single episode, unspecified: Secondary | ICD-10-CM | POA: Insufficient documentation

## 2012-10-16 DIAGNOSIS — Z91040 Latex allergy status: Secondary | ICD-10-CM | POA: Insufficient documentation

## 2012-10-16 DIAGNOSIS — Y9389 Activity, other specified: Secondary | ICD-10-CM | POA: Insufficient documentation

## 2012-10-16 DIAGNOSIS — Z79899 Other long term (current) drug therapy: Secondary | ICD-10-CM | POA: Insufficient documentation

## 2012-10-16 DIAGNOSIS — Z8679 Personal history of other diseases of the circulatory system: Secondary | ICD-10-CM | POA: Insufficient documentation

## 2012-10-16 DIAGNOSIS — S92309A Fracture of unspecified metatarsal bone(s), unspecified foot, initial encounter for closed fracture: Secondary | ICD-10-CM | POA: Insufficient documentation

## 2012-10-16 DIAGNOSIS — Z8719 Personal history of other diseases of the digestive system: Secondary | ICD-10-CM | POA: Insufficient documentation

## 2012-10-16 DIAGNOSIS — Z8619 Personal history of other infectious and parasitic diseases: Secondary | ICD-10-CM | POA: Insufficient documentation

## 2012-10-16 DIAGNOSIS — Z862 Personal history of diseases of the blood and blood-forming organs and certain disorders involving the immune mechanism: Secondary | ICD-10-CM | POA: Insufficient documentation

## 2012-10-16 DIAGNOSIS — Z8639 Personal history of other endocrine, nutritional and metabolic disease: Secondary | ICD-10-CM | POA: Insufficient documentation

## 2012-10-16 DIAGNOSIS — Y929 Unspecified place or not applicable: Secondary | ICD-10-CM | POA: Insufficient documentation

## 2012-10-16 DIAGNOSIS — W010XXA Fall on same level from slipping, tripping and stumbling without subsequent striking against object, initial encounter: Secondary | ICD-10-CM | POA: Insufficient documentation

## 2012-10-16 DIAGNOSIS — S92354A Nondisplaced fracture of fifth metatarsal bone, right foot, initial encounter for closed fracture: Secondary | ICD-10-CM

## 2012-10-16 DIAGNOSIS — Z8709 Personal history of other diseases of the respiratory system: Secondary | ICD-10-CM | POA: Insufficient documentation

## 2012-10-16 DIAGNOSIS — Z8739 Personal history of other diseases of the musculoskeletal system and connective tissue: Secondary | ICD-10-CM | POA: Insufficient documentation

## 2012-10-16 DIAGNOSIS — F3289 Other specified depressive episodes: Secondary | ICD-10-CM | POA: Insufficient documentation

## 2012-10-16 DIAGNOSIS — IMO0002 Reserved for concepts with insufficient information to code with codable children: Secondary | ICD-10-CM | POA: Insufficient documentation

## 2012-10-16 MED ORDER — HYDROCODONE-ACETAMINOPHEN 5-325 MG PO TABS: 1.0000 | ORAL_TABLET | ORAL | Status: DC | PRN

## 2012-10-16 NOTE — ED Provider Notes (Signed)
CSN: [id number]     Arrival date & time 10/16/12  2030 History  This chart was scribed for Ruby Cola, PA-C, working with Harrold Donath R. Rubin Payor, MD by Frederik Pear, ED Scribe. This patient was seen in room WTR7/WTR7 and the patient's care was started at 23:00.   None    Chief Complaint  Patient presents with  . Foot Pain   (Consider location/radiation/quality/duration/timing/severity/associated sxs/prior Treatment) The history is provided by the patient and medical records. No language interpreter was used.    HPI Comments: Maria Frank is a 37 y.o. female who presents to the Emergency Department complaining of constant, moderate right lateral foot pain that began at 19:30 when she was tripped by running dogs. She reports she fell to the ground and landed on her bottom. In the ED, she denies any other pain. She reports she is unable to bear weight. She denies treatment at home.   Past Medical History  Diagnosis Date  . ARTHRITIS   . CHICKENPOX, HX OF   . Migraine   . IBS (irritable bowel syndrome)   . ALLERGIC RHINITIS   . HYPERLIPIDEMIA   . DEPRESSION    Past Surgical History  Procedure Laterality Date  . No past surgeries     Family History  Problem Relation Age of Onset  . Hypertension Father   . Hypertension Paternal Grandfather   . Arthritis Other     parent & grandparents  . Breast cancer Maternal Aunt 42    also cousin age 55 dx   History  Substance Use Topics  . Smoking status: Never Smoker   . Smokeless tobacco: Not on file     Comment: Married since 2004, but pursuing separtion 10/2012 from spouse. Originally from [place], in [place] since 2008. 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 Alcohol Use: Yes     Comment: Social   OB History   Grav Para Term Preterm Abortions TAB SAB Ect Mult Living                 Review of Systems  All other systems reviewed and are negative.    Allergies  Latex  Home Medications   Current Outpatient Rx  Name   Route  Sig  Dispense  Refill  . ALPRAZolam (XANAX) 0.5 MG tablet   Oral   Take 0.5-1 tablets (0.25-0.5 mg total) by mouth 3 (three) times daily as needed for sleep or anxiety.   30 tablet   1   . cetirizine (ZYRTEC) 10 MG tablet   Oral   Take 10 mg by mouth daily.         . mometasone (NASONEX) 50 MCG/ACT nasal spray   Nasal   2 sprays by Nasal route daily.            BP 127/98  Pulse 71  Temp(Src) 97.6 F (36.4 C)  Resp 20  SpO2 100%  LMP 10/09/2012 Physical Exam  Nursing note and vitals reviewed. Constitutional: She is oriented to person, place, and time. She appears well-developed and well-nourished. No distress.  HENT:  Head: Normocephalic and atraumatic.  Eyes:  Normal appearance  Neck: Normal range of motion.  Cardiovascular:  Cap refill is less than 3 seconds. Intact DP pulses.  Pulmonary/Chest: Effort normal.  Musculoskeletal: Normal range of motion.  Tenderness and edema over the head of the right fifth metatarsal. Ankle and knee are non-tender.   Neurological: She is alert and oriented to person, place, and time. No sensory deficit.  Sensation is intact.  Psychiatric: She has a normal mood and affect. Her behavior is normal.   ED Course  Procedures (including critical care time)  DIAGNOSTIC STUDIES: Oxygen Saturation is 100% on room air, normal by my interpretation.    COORDINATION OF CARE:  23:06- Discussed planned course of treatment for discharge with the patient, including a cam walker, an orthopedist referral, and RICE therapy, who is agreeable at this time.  Labs Review Labs Reviewed - No data to display Imaging Review Dg Foot Complete Right  10/16/2012   CLINICAL DATA:  History of fall complaining of right-sided foot pain.  EXAM: RIGHT FOOT COMPLETE - 3+ VIEW  COMPARISON:  No priors.  FINDINGS: Three views of the right foot demonstrate a nondisplaced oblique fractures through the proximal aspect of the 5th metatarsal. No other acute displaced  fracture, subluxation or dislocation is noted.  IMPRESSION: 1. Nondisplaced oblique fracture through the proximal 3rd of the 5th metatarsal.   Electronically Signed   By: Trudie Reed M.D.   On: 10/16/2012 21:47    MDM   1. Nondisplaced fracture of fifth right metatarsal bone, closed, initial encounter    Healthy 37yo F presents w/ traumatic R foot pain.  Localized edema/tenderness at head of 5th metatarsal.  Xray shows non-displaced fx same location.  Results discussed w/ pt.  Ortho tech provided her with cam walker and crutches and pt d/c'd home w/ vicodin and referral to ortho.  Recommended RICE.    I personally performed the services described in this documentation, which was scribed in my presence. The recorded information has been reviewed and is accurate.    Otilio Miu, PA-C 10/17/12 [phone]

## 2012-10-16 NOTE — ED Notes (Signed)
Pt c/o right foot pain; tripped over dog and knocked off feet

## 2012-10-20 NOTE — ED Provider Notes (Signed)
Medical screening examination/treatment/procedure(s) were performed by non-physician practitioner and as supervising physician I was immediately available for consultation/collaboration.    Kamika Goodloe J. Seibert Keeter, MD 10/20/12 1534 

## 2012-11-01 ENCOUNTER — Ambulatory Visit: Payer: Self-pay | Admitting: Gynecology

## 2012-11-07 ENCOUNTER — Other Ambulatory Visit (HOSPITAL_COMMUNITY)
Admission: RE | Admit: 2012-11-07 | Discharge: 2012-11-07 | Disposition: A | Discharge status: Home / Self Care | Payer: BC Managed Care – PPO | Source: Ambulatory Visit | Attending: Gynecology | Admitting: Gynecology

## 2012-11-07 ENCOUNTER — Ambulatory Visit (INDEPENDENT_AMBULATORY_CARE_PROVIDER_SITE_OTHER): Payer: BC Managed Care – PPO | Admitting: Gynecology

## 2012-11-07 ENCOUNTER — Encounter: Payer: Self-pay | Admitting: Gynecology

## 2012-11-07 VITALS — BP 108/68 | Ht 64.0 in | Wt 162.0 lb

## 2012-11-07 DIAGNOSIS — Z01419 Encounter for gynecological examination (general) (routine) without abnormal findings: Secondary | ICD-10-CM | POA: Insufficient documentation

## 2012-11-07 DIAGNOSIS — IMO0001 Reserved for inherently not codable concepts without codable children: Secondary | ICD-10-CM

## 2012-11-07 DIAGNOSIS — Z309 Encounter for contraceptive management, unspecified: Secondary | ICD-10-CM

## 2012-11-07 DIAGNOSIS — Z1151 Encounter for screening for human papillomavirus (HPV): Secondary | ICD-10-CM | POA: Insufficient documentation

## 2012-11-07 NOTE — Progress Notes (Addendum)
Maria Frank [birth year] [id number]   History:    37 y.o.  for annual gyn exam With no complaints today. Patient  Is new to the practice. Patient previously was followed by Dr. Nicholas Lose.patient's PCP is Dr. Azalia Bilis who recently did her lab work. Patient wanted to discuss contraceptive options and is interested in being on hormonal IUD. Patient denies any prior history of abnormal Pap smears. Patient reports normal menstrual cycles. Patient's vaccinations all up-to-date including flu vaccine recently. She frequently does her breast exam.  Patient was in 2 weeks ago had a fragility fracture of her fifth metatarsal when she fell from a standing position. Her mother does have a history of osteoporosis. Patient taking calcium with vitamin D twice a day.  Past medical history,surgical history, family history and social history were all reviewed and documented in the EPIC chart.  Gynecologic History Patient's last menstrual period was 11/01/2012. Contraception: none Last Pap: 2011. Results were: normal Last mammogram: none indicated. Results were: not indicated  Obstetric History OB History  No data available     ROS: A ROS was performed and pertinent positives and negatives are included in the history.  GENERAL: No fevers or chills. HEENT: No change in vision, no earache, sore throat or sinus congestion. NECK: No pain or stiffness. CARDIOVASCULAR: No chest pain or pressure. No palpitations. PULMONARY: No shortness of breath, cough or wheeze. GASTROINTESTINAL: No abdominal pain, nausea, vomiting or diarrhea, melena or bright red blood per rectum. GENITOURINARY: No urinary frequency, urgency, hesitancy or dysuria. MUSCULOSKELETAL: No joint or muscle pain, no back pain, no recent trauma. DERMATOLOGIC: No rash, no itching, no lesions. ENDOCRINE: No polyuria, polydipsia, no heat or cold intolerance. No recent change in weight. HEMATOLOGICAL: No anemia or easy bruising or bleeding. NEUROLOGIC: No  headache, seizures, numbness, tingling or weakness. PSYCHIATRIC: No depression, no loss of interest in normal activity or change in sleep pattern.     Exam: chaperone present  BP 108/68  Ht 5\' 4"  (1.626 m)  Wt 162 lb (73.483 kg)  BMI 27.79 kg/m2  LMP 11/01/2012  Body mass index is 27.79 kg/(m^2).  General appearance : Well developed well nourished female. No acute distress HEENT: Neck supple, trachea midline, no carotid bruits, no thyroidmegaly Lungs: Clear to auscultation, no rhonchi or wheezes, or rib retractions  Heart: Regular rate and rhythm, no murmurs or gallops Breast:Examined in sitting and supine position were symmetrical in appearance, no palpable masses or tenderness,  no skin retraction, no nipple inversion, no nipple discharge, no skin discoloration, no axillary or supraclavicular lymphadenopathy Abdomen: no palpable masses or tenderness, no rebound or guarding Extremities: no edema or skin discoloration or tenderness  Pelvic:  Bartholin, Urethra, Skene Glands: Within normal limits             Vagina: No gross lesions or discharge  Cervix: No gross lesions or discharge  Uterus  anteverted, normal size, shape and consistency, non-tender and mobile  Adnexa  Without masses or tenderness  Anus and perineum  normal   Rectovaginal  normal sphincter tone without palpated masses or tenderness             Hemoccult not indicated     Assessment/Plan:  37 y.o. female for annual exam will return back to the office at the start of her next cycle so that we can place a ParaGard T380A IUD. The risks benefits and pros and cone were discussed. Patient previously had reviewed the literature information. Pap smear was done today. Blood work  done by PCP.  Note: This dictation was prepared with  Dragon/digital dictation along withSmart phrase technology. Any transcriptional errors that result from this process are unintentional.   Ok Edwards MD, 10:43 AM 11/07/2012

## 2012-11-09 ENCOUNTER — Other Ambulatory Visit: Payer: Self-pay | Admitting: Gynecology

## 2012-11-09 ENCOUNTER — Telehealth: Payer: Self-pay | Admitting: Gynecology

## 2012-11-09 DIAGNOSIS — Z3049 Encounter for surveillance of other contraceptives: Secondary | ICD-10-CM

## 2012-11-09 NOTE — Telephone Encounter (Signed)
11/09/12-Spoke w/ pt today to let her know that her insurance covers the Paraguard IUD and insertion at 100%, no copay.She will call next cycle to schedule insertion with JF/WL

## 2012-12-01 ENCOUNTER — Encounter: Payer: Self-pay | Admitting: Gynecology

## 2012-12-01 ENCOUNTER — Ambulatory Visit (INDEPENDENT_AMBULATORY_CARE_PROVIDER_SITE_OTHER): Payer: BC Managed Care – PPO | Admitting: Gynecology

## 2012-12-01 VITALS — BP 130/86

## 2012-12-01 DIAGNOSIS — Z975 Presence of (intrauterine) contraceptive device: Secondary | ICD-10-CM | POA: Insufficient documentation

## 2012-12-01 DIAGNOSIS — Z30430 Encounter for insertion of intrauterine contraceptive device: Secondary | ICD-10-CM

## 2012-12-01 NOTE — Progress Notes (Signed)
Patient was seen in the office recently for annual exam was requesting changing contraception from oral to [facility] IUD (no abnormal). Patient is currently menstruating. Haze Justin information had previously been provided.                                IUD procedure note       Patient presented to the office today for placement of ParaGard T380A IUD. The patient had previously been provided with literature information on this method of contraception. The risks benefits and pros and cons were discussed and all her questions were answered. She is fully aware that this form of contraception is 99% effective and is good for 10 years.  Pelvic exam: Bartholin urethra Skene glands: Within normal limits Vagina: No lesions or discharge, menstrual blood Cervix: No lesions or discharge Uterus: anteverted position Adnexa: No masses or tenderness Rectal exam: Not done  The cervix was cleansed with Betadine solution. A single-tooth tenaculum was placed on the anterior cervical lip. The uterus sounded to 7-1/2 centimeter. The IUD was shown to the patient and inserted in a sterile fashion. The IUD string was trimmed. The single-tooth tenaculum was removed. Patient was instructed to return back to the office in one month for follow up.     Lot number: [id number]

## 2012-12-01 NOTE — Patient Instructions (Signed)
Intrauterine Device Information  An intrauterine device (IUD) is inserted into your uterus and prevents pregnancy. There are 2 types of IUDs available:  · Copper IUD. This type of IUD is wrapped in copper wire and is placed inside the uterus. Copper makes the uterus and fallopian tubes produce a fluid that kills sperm. The copper IUD can stay in place for 10 years.  · Hormone IUD. This type of IUD contains the hormone progestin (synthetic progesterone). The hormone thickens the cervical mucus and prevents sperm from entering the uterus, and it also thins the uterine lining to prevent implantation of a fertilized egg. The hormone can weaken or kill the sperm that get into the uterus. The hormone IUD can stay in place for 5 years.  Your caregiver will make sure you are a good candidate for a contraceptive IUD. Discuss with your caregiver the possible side effects.  ADVANTAGES  · It is highly effective, reversible, long-acting, and low maintenance.  · There are no estrogen-related side effects.  · An IUD can be used when breastfeeding.  · It is not associated with weight gain.  · It works immediately after insertion.  · The copper IUD does not interfere with your female hormones.  · The progesterone IUD can make heavy menstrual periods lighter.  · The progesterone IUD can be used for 5 years.  · The copper IUD can be used for 10 years.  DISADVANTAGES  · The progesterone IUD can be associated with irregular bleeding patterns.  · The copper IUD can make your menstrual flow heavier and more painful.  · You may experience cramping and vaginal bleeding after insertion.  Document Released: 12/23/2003 Document Revised: 04/12/2011 Document Reviewed: 05/23/2010  Exit[employer]® Patient Information ©2014 [employer].

## 2013-01-05 ENCOUNTER — Ambulatory Visit: Payer: BC Managed Care – PPO | Admitting: Gynecology

## 2013-01-08 ENCOUNTER — Encounter: Payer: Self-pay | Admitting: Gynecology

## 2013-01-08 ENCOUNTER — Ambulatory Visit (INDEPENDENT_AMBULATORY_CARE_PROVIDER_SITE_OTHER): Payer: BC Managed Care – PPO | Admitting: Gynecology

## 2013-01-08 VITALS — BP 132/84

## 2013-01-08 DIAGNOSIS — Z30431 Encounter for routine checking of intrauterine contraceptive device: Secondary | ICD-10-CM

## 2013-01-08 NOTE — Progress Notes (Signed)
Patient presented to the office today for one month followup after having placed a ParaGard T380A IUD. She is doing well she had a menstrual cycle lasted 4 days some cramps are reported as had no intermenstrual spotting. She has not been sexually active since the LAD was placed. Patient was otherwise asymptomatic.  Exam: Bartholin's urethra Skene was within normal limits Vagina: No lesions or discharge Cervix: No lesions or discharge Uterus anteverted normal size shape and consistency Adnexa: No palpable mass or tenderness Rectal exam not done  Assessment/plan: Patient one month status post placement of ParaGard T380A IUD doing well.Patient otherwise get her to return back in one year for her annual gynecological exam or when necessary.

## 2013-03-13 ENCOUNTER — Encounter: Payer: Self-pay | Admitting: Internal Medicine

## 2013-03-13 ENCOUNTER — Telehealth: Payer: Self-pay | Admitting: *Deleted

## 2013-03-13 ENCOUNTER — Ambulatory Visit (INDEPENDENT_AMBULATORY_CARE_PROVIDER_SITE_OTHER): Payer: BC Managed Care – PPO | Admitting: Internal Medicine

## 2013-03-13 VITALS — BP 102/80 | HR 85 | Temp 98.3°F | Wt 163.4 lb

## 2013-03-13 DIAGNOSIS — J329 Chronic sinusitis, unspecified: Secondary | ICD-10-CM

## 2013-03-13 DIAGNOSIS — H9202 Otalgia, left ear: Secondary | ICD-10-CM

## 2013-03-13 DIAGNOSIS — H9209 Otalgia, unspecified ear: Secondary | ICD-10-CM

## 2013-03-13 MED ORDER — PROMETHAZINE-PHENYLEPHRINE 6.25-5 MG/5ML PO SYRP: 5.0000 mL | ORAL_SOLUTION | ORAL | Status: DC | PRN

## 2013-03-13 MED ORDER — PROMETHAZINE-CODEINE 6.25-10 MG/5ML PO SYRP: 5.0000 mL | ORAL_SOLUTION | ORAL | Status: DC | PRN

## 2013-03-13 MED ORDER — ANTIPYRINE-BENZOCAINE 5.4-1.4 % OT SOLN: 3.0000 [drp] | OTIC | Status: DC | PRN

## 2013-03-13 MED ORDER — ALPRAZOLAM 0.5 MG PO TABS: 0.2500 mg | ORAL_TABLET | Freq: Three times a day (TID) | ORAL | Status: DC | PRN

## 2013-03-13 NOTE — Telephone Encounter (Signed)
Order phoned into [place] at [place] per MD order.

## 2013-03-13 NOTE — Progress Notes (Signed)
Maria Frank [id number] 03/13/2013   Chief Complaint  Patient presents with  . Sinusitis    Subjective  Sinusitis This is a new problem. The current episode started in the past 7 days. The problem has been gradually worsening since onset. There has been no fever. Her pain is at a severity of 3/10. Associated symptoms include chills, congestion, ear pain (L>R), headaches, sinus pressure (BL) and a sore throat. Pertinent negatives include no coughing, diaphoresis, hoarse voice, neck pain, shortness of breath, sneezing or swollen glands. Past treatments include acetaminophen, saline sprays and spray decongestants. The treatment provided moderate relief.    Past Medical History  Diagnosis Date  . ARTHRITIS   . CHICKENPOX, HX OF   . Migraine   . IBS (irritable bowel syndrome)   . ALLERGIC RHINITIS   . HYPERLIPIDEMIA   . DEPRESSION     Past Surgical History  Procedure Laterality Date  . No past surgeries      Family History  Problem Relation Age of Onset  . Hypertension Father   . Hypertension Paternal Grandfather   . Arthritis Other     parent & grandparents  . Breast cancer Maternal Aunt 44    also cousin age 11 dx  . Cancer Mother     pancreatic, liver and renal   . Breast cancer Cousin     History  Substance Use Topics  . Smoking status: Never Smoker   . Smokeless tobacco: Not on file     Comment: Married since 2004, but pursuing separtion 10/2012 from spouse. Originally from [place], in [place] since 2008. 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 Alcohol Use: Yes     Comment: Social    Current Outpatient Prescriptions on File Prior to Visit  Medication Sig Dispense Refill  . ALPRAZolam (XANAX) 0.5 MG tablet Take 0.5-1 tablets (0.25-0.5 mg total) by mouth 3 (three) times daily as needed for sleep or anxiety.  30 tablet  1  . cetirizine (ZYRTEC) 10 MG tablet Take 10 mg by mouth daily.      Marland Kitchen HYDROcodone-acetaminophen (NORCO/VICODIN) 5-325 MG per tablet Take 1 tablet  by mouth every 4 (four) hours as needed for pain.  20 tablet  0  . mometasone (NASONEX) 50 MCG/ACT nasal spray 2 sprays by Nasal route daily.         No current facility-administered medications on file prior to visit.    Allergies:  Allergies  Allergen Reactions  . Latex     Review of Systems  Constitutional: Positive for chills. Negative for fever, weight loss, malaise/fatigue and diaphoresis.  HENT: Positive for congestion, ear pain (L>R), sinus pressure (BL) and sore throat. Negative for ear discharge, hearing loss, hoarse voice, nosebleeds, sneezing and tinnitus.   Eyes: Negative for blurred vision, double vision, photophobia, discharge and redness.  Respiratory: Negative for cough, hemoptysis, sputum production, shortness of breath, wheezing and stridor.   Cardiovascular: Negative for chest pain.  Gastrointestinal: Negative for heartburn, nausea, vomiting and abdominal pain.  Musculoskeletal: Negative for neck pain.  Neurological: Positive for headaches. Negative for dizziness, tingling and speech change.  Psychiatric/Behavioral: Negative for depression and suicidal ideas. The patient is not nervous/anxious and does not have insomnia.        Objective  Filed Vitals:   03/13/13 0857  BP: 102/80  Pulse: 85  Temp: 98.3 F (36.8 C)  TempSrc: Oral  Weight: 163 lb 6.4 oz (74.118 kg)  SpO2: 94%    Physical Exam  Nursing note and  vitals reviewed. Constitutional: She is oriented to person, place, and time. She appears well-developed and well-nourished. No distress.  HENT:  Head: Normocephalic and atraumatic.  Right Ear: Hearing, tympanic membrane and external ear normal. Tympanic membrane is not injected, not scarred, not perforated and not erythematous.  Left Ear: Hearing and external ear normal. Tympanic membrane is not injected, not perforated and not erythematous.  Nose: Right sinus exhibits maxillary sinus tenderness. Left sinus exhibits maxillary sinus tenderness.    Mouth/Throat: Uvula is midline, oropharynx is clear and moist and mucous membranes are normal. No oropharyngeal exudate.  Eyes: Conjunctivae are normal. Pupils are equal, round, and reactive to light.  Neck: Normal range of motion. No JVD present.  Cardiovascular: Normal rate, regular rhythm and intact distal pulses.  Exam reveals no gallop and no friction rub.   Murmur (Hx. of mild Mitral. R.) heard. Respiratory: Effort normal and breath sounds normal. No stridor. No respiratory distress. She has no wheezes. She exhibits no tenderness.  Musculoskeletal: Normal range of motion. She exhibits no tenderness.  Lymphadenopathy:    She has no cervical adenopathy.  Neurological: She is alert and oriented to person, place, and time.  Skin: Skin is warm and dry. She is not diaphoretic.  Psychiatric: She has a normal mood and affect. Her speech is normal and behavior is normal. Judgment and thought content normal. Cognition and memory are normal.    BP Readings from Last 3 Encounters:  03/13/13 102/80  01/08/13 132/84  12/01/12 130/86    Wt Readings from Last 3 Encounters:  03/13/13 163 lb 6.4 oz (74.118 kg)  11/07/12 162 lb (73.483 kg)  10/09/12 165 lb (74.844 kg)    Lab Results  Component Value Date   WBC 10.8* 10/09/2012   HGB 13.1 10/09/2012   HCT 38.7 10/09/2012   PLT 233.0 10/09/2012   GLUCOSE 83 10/09/2012   CHOL 201* 10/09/2012   TRIG 90.0 10/09/2012   HDL 61.80 10/09/2012   LDLDIRECT 131.3 10/09/2012   LDLCALC 109* 11/17/2010   ALT 15 10/09/2012   AST 20 10/09/2012   NA 138 10/09/2012   K 4.1 10/09/2012   CL 107 10/09/2012   CREATININE 0.7 10/09/2012   BUN 12 10/09/2012   CO2 26 10/09/2012   TSH 2.12 10/09/2012       Assessment and Plan  Acute sinusitis -  symptoms are consistent with acute sinusitis.  Quick onset of symptoms <7 days, afebrile, no cough, maxillary sinus pressure.  Will prescribe  For symptomatic treatment.  Prescription Decongestant Promethazine-vc  As well as an analgesic ear  drops.  \  Advised patient if symptoms do not resolve by Thursday/friday to notify the office and would prescribe empirical antibiotics treatment.   Mild Mitral regur.  -  Asymptomatic- no changes made.   Patient was instructed to notify the office if any new symptoms emerged or the current symptoms become worse.   No Follow-up on file. Theone Murdoch, Talitha Givens

## 2013-03-13 NOTE — Telephone Encounter (Signed)
Pharmacy may substitute promethazine with codeine syrup every 4 hours as needed (ok to call in - see pending order) Have pharmacy also ask patient to take Sudafed PE every 4 hours with syrup for decongestant effect

## 2013-03-13 NOTE — Patient Instructions (Addendum)
It was good to see you today.  If you develop worsening symptoms or fever, call and we can reconsider antibiotics, but it does not appear necessary to use antibiotics at this time.  Topical at your pain relief drops and prescription decongestant syrup - Your prescription(s) have been submitted to your pharmacy. Please take as directed and contact our office if you believe you are having problem(s) with the medication(s).  Alternate between ibuprofen and tylenol for aches, pain and fever symptoms as discussed  Hydrate, rest and call if worse or unimprovedSinusitis Sinusitis is redness, soreness, and swelling (inflammation) of the paranasal sinuses. Paranasal sinuses are air pockets within the bones of your face (beneath the eyes, the middle of the forehead, or above the eyes). In healthy paranasal sinuses, mucus is able to drain out, and air is able to circulate through them by way of your nose. However, when your paranasal sinuses are inflamed, mucus and air can become trapped. This can allow bacteria and other germs to grow and cause infection. Sinusitis can develop quickly and last only a short time (acute) or continue over a long period (chronic). Sinusitis that lasts for more than 12 weeks is considered chronic.  CAUSES  Causes of sinusitis include:  Allergies.  Structural abnormalities, such as displacement of the cartilage that separates your nostrils (deviated septum), which can decrease the air flow through your nose and sinuses and affect sinus drainage.  Functional abnormalities, such as when the small hairs (cilia) that line your sinuses and help remove mucus do not work properly or are not present. SYMPTOMS  Symptoms of acute and chronic sinusitis are the same. The primary symptoms are pain and pressure around the affected sinuses. Other symptoms include:  Upper toothache.  Earache.  Headache.  Bad breath.  Decreased sense of smell and taste.  A cough, which worsens when  you are lying flat.  Fatigue.  Fever.  Thick drainage from your nose, which often is green and may contain pus (purulent).  Swelling and warmth over the affected sinuses. DIAGNOSIS  Your caregiver will perform a physical exam. During the exam, your caregiver may:  Look in your nose for signs of abnormal growths in your nostrils (nasal polyps).  Tap over the affected sinus to check for signs of infection.  View the inside of your sinuses (endoscopy) with a special imaging device with a light attached (endoscope), which is inserted into your sinuses. If your caregiver suspects that you have chronic sinusitis, one or more of the following tests may be recommended:  Allergy tests.  Nasal culture A sample of mucus is taken from your nose and sent to a lab and screened for bacteria.  Nasal cytology A sample of mucus is taken from your nose and examined by your caregiver to determine if your sinusitis is related to an allergy. TREATMENT  Most cases of acute sinusitis are related to a viral infection and will resolve on their own within 10 days. Sometimes medicines are prescribed to help relieve symptoms (pain medicine, decongestants, nasal steroid sprays, or saline sprays).  However, for sinusitis related to a bacterial infection, your caregiver will prescribe antibiotic medicines. These are medicines that will help kill the bacteria causing the infection.  Rarely, sinusitis is caused by a fungal infection. In theses cases, your caregiver will prescribe antifungal medicine. For some cases of chronic sinusitis, surgery is needed. Generally, these are cases in which sinusitis recurs more than 3 times per year, despite other treatments. HOME CARE INSTRUCTIONS  Drink plenty of water. Water helps thin the mucus so your sinuses can drain more easily.  Use a humidifier.  Inhale steam 3 to 4 times a day (for example, sit in the bathroom with the shower running).  Apply a warm, moist washcloth  to your face 3 to 4 times a day, or as directed by your caregiver.  Use saline nasal sprays to help moisten and clean your sinuses.  Take over-the-counter or prescription medicines for pain, discomfort, or fever only as directed by your caregiver. SEEK IMMEDIATE MEDICAL CARE IF:  You have increasing pain or severe headaches.  You have nausea, vomiting, or drowsiness.  You have swelling around your face.  You have vision problems.  You have a stiff neck.  You have difficulty breathing. MAKE SURE YOU:   Understand these instructions.  Will watch your condition.  Will get help right away if you are not doing well or get worse. Document Released: 01/18/2005 Document Revised: 04/12/2011 Document Reviewed: 02/02/2011 [facility] Patient Information 2014 [place], [place].

## 2013-03-13 NOTE — Progress Notes (Signed)
    Subjective:    HPI  complains of head cold symptoms, ?sinusitus Onset 3 days ago, gradually worsening symptoms  First associated with sore throat, mild headache and low grade fever Now sinus pressure, L ear pain and mod nasal congestion, yellow-clear discharge Minimal relief with OTC meds Precipitated by sick contacts and weather change  Past Medical History  Diagnosis Date  . ARTHRITIS   . CHICKENPOX, HX OF   . Migraine   . IBS (irritable bowel syndrome)   . ALLERGIC RHINITIS   . HYPERLIPIDEMIA   . DEPRESSION     Review of Systems Constitutional: No night sweats, no unexpected weight change Pulmonary: No pleurisy or hemoptysis Cardiovascular: No chest pain or palpitations     Objective:   Physical Exam BP 102/80  Pulse 85  Temp(Src) 98.3 F (36.8 C) (Oral)  Wt 163 lb 6.4 oz (74.118 kg)  SpO2 94% GEN: mildly ill appearing and audible head congestion HENT: NCAT, mild sinus tenderness bilaterally, nares with thick discharge and turbinate swelling, oropharynx mild erythema and PND, no exudate. Bilateral TMs clear without effusion or erythema. No canal erythema or lesions Eyes: Vision grossly intact, no conjunctivitis Lungs: Clear to auscultation without rhonchi or wheeze, no increased work of breathing Cardiovascular: Regular rate and rhythm, no bilateral edema  Lab Results  Component Value Date   WBC 10.8* 10/09/2012   HGB 13.1 10/09/2012   HCT 38.7 10/09/2012   PLT 233.0 10/09/2012   GLUCOSE 83 10/09/2012   CHOL 201* 10/09/2012   TRIG 90.0 10/09/2012   HDL 61.80 10/09/2012   LDLDIRECT 131.3 10/09/2012   LDLCALC 109* 11/17/2010   ALT 15 10/09/2012   AST 20 10/09/2012   NA 138 10/09/2012   K 4.1 10/09/2012   CL 107 10/09/2012   CREATININE 0.7 10/09/2012   BUN 12 10/09/2012   CO2 26 10/09/2012   TSH 2.12 10/09/2012      Assessment & Plan:  Viral URI with sinusitis and congestion Left ear pain - no evidence for otitis media or effusion/erythema  Explained lack of efficacy for  antibiotics and viral disease, the patient will call if symptoms worse or unimproved in next 7 days Prescription antihistamine with decongestant and cough suppression; also topical analgesia - new prescriptions done Symptomatic care with Tylenol or Advil, hydration and rest -  Saline irrigation and salt gargle advised as needed

## 2013-03-13 NOTE — Telephone Encounter (Signed)
Megan, with [facility], phoned to state that the ordered promethazine-phenylephrine ([place]) 6.25-5 MG/5ML SYRP is not available and is asking if you have an alternate you'd like to order.  Please advise.   CB# [phone]

## 2013-03-13 NOTE — Progress Notes (Signed)
Pre-visit discussion using our clinic review tool. No additional management support is needed unless otherwise documented below in the visit note.  

## 2013-04-10 ENCOUNTER — Ambulatory Visit (INDEPENDENT_AMBULATORY_CARE_PROVIDER_SITE_OTHER): Payer: BC Managed Care – PPO | Admitting: Physician Assistant

## 2013-04-10 ENCOUNTER — Encounter: Payer: Self-pay | Admitting: Physician Assistant

## 2013-04-10 VITALS — BP 124/60 | HR 90 | Temp 98.6°F | Resp 16 | Ht 64.0 in | Wt 165.4 lb

## 2013-04-10 DIAGNOSIS — H6980 Other specified disorders of Eustachian tube, unspecified ear: Secondary | ICD-10-CM

## 2013-04-10 DIAGNOSIS — A499 Bacterial infection, unspecified: Secondary | ICD-10-CM

## 2013-04-10 DIAGNOSIS — B9689 Other specified bacterial agents as the cause of diseases classified elsewhere: Secondary | ICD-10-CM

## 2013-04-10 DIAGNOSIS — J329 Chronic sinusitis, unspecified: Secondary | ICD-10-CM

## 2013-04-10 MED ORDER — AMOXICILLIN 875 MG PO TABS: 875.0000 mg | ORAL_TABLET | Freq: Two times a day (BID) | ORAL | Status: DC

## 2013-04-10 MED ORDER — METHYLPREDNISOLONE 4 MG PO KIT: PACK | ORAL | Status: DC

## 2013-04-10 NOTE — Progress Notes (Signed)
    Patient ID: Maria Frank is a 38 y.o. female DOB: [birth year] MRN: [id number]  Subjective:    HPI  complains of head cold symptoms,  Had similar one month PTA, symptoms seem to resolve for most part then relapsing and worse symptoms  First associated with rhinorrhea, sneezing, mild headache and low grade fever along with left ear pain Now sinus pressure and mild-mod nasal congestion, along with dizziness when turn head quickly, stand up quickly or bend over and return to upright position. Dizziness resolves in a few seconds then has to move slowly to prevent recurrence.  No relief with OTC meds - antihistamine, flonase and auralgan for ear pain. History of chronic sinus issues Precipitated by sick contacts and weather change  Past Medical History  Diagnosis Date  . ARTHRITIS   . CHICKENPOX, HX OF   . Migraine   . IBS (irritable bowel syndrome)   . ALLERGIC RHINITIS   . HYPERLIPIDEMIA   . DEPRESSION     Review of Systems Constitutional: No fever, no change in activity Pulmonary: No wheezing or hemoptysis Cardiovascular: No chest pain or palpitations Abdomen: No N/V/F/C    Objective:   Physical Exam BP 124/60  Pulse 90  Temp(Src) 98.6 F (37 C) (Oral)  Resp 16  Ht 5\' 4"  (1.626 m)  Wt 165 lb 6.4 oz (75.025 kg)  BMI 28.38 kg/m2  SpO2 98% GEN: mildly ill appearing and audible head/chest congestion HEENT: NCAT, bilateral TM's without injections, bulging or fluid, mild sinus tenderness bilaterally, nares with discharge and turbinate swelling, oropharynx mild erythema and PND, no exudate Eyes: Vision grossly intact, no conjunctivitis Lungs: Clear to auscultation without rhonchi or wheeze, no increased work of breathing Cardiovascular: Regular rate and rhythm, no bilateral edema  Lab Results  Component Value Date   WBC 10.8* 10/09/2012   HGB 13.1 10/09/2012   HCT 38.7 10/09/2012   PLT 233.0 10/09/2012   GLUCOSE 83 10/09/2012   CHOL 201* 10/09/2012   TRIG 90.0 10/09/2012   HDL  61.80 10/09/2012   LDLDIRECT 131.3 10/09/2012   LDLCALC 109* 11/17/2010   ALT 15 10/09/2012   AST 20 10/09/2012   NA 138 10/09/2012   K 4.1 10/09/2012   CL 107 10/09/2012   CREATININE 0.7 10/09/2012   BUN 12 10/09/2012   CO2 26 10/09/2012   TSH 2.12 10/09/2012      Assessment & Plan:  Viral URI > progression to acute sinusitis Eustachian tube dysfunction   Empiric antibiotics prescribed due to symptom duration greater than 2 weeks and progression despite OTC symptomatic care Prescription cough suppression - can continue use if needed Medrol dose pack  Symptomatic care with Tylenol or Advil, hydration and rest -  Saline irrigation and salt gargle advised as needed RTO if no improvement of symptoms

## 2013-04-10 NOTE — Progress Notes (Signed)
Pre visit review using our clinic review tool, if applicable. No additional management support is needed unless otherwise documented below in the visit note. 

## 2013-04-10 NOTE — Patient Instructions (Addendum)
It was great to meet you today Maria Frank!   I have ordered an antibiotic, sent to your pharmacy, please take as directed.  Sinusitis Sinusitis is redness, soreness, and puffiness (inflammation) of the air pockets in the bones of your face (sinuses). The redness, soreness, and puffiness can cause air and mucus to get trapped in your sinuses. This can allow germs to grow and cause an infection.  HOME CARE   Drink enough fluids to keep your pee (urine) clear or pale yellow.  Use a humidifier in your home.  Run a hot shower to create steam in the bathroom. Sit in the bathroom with the door closed. Breathe in the steam 3 4 times a day.  Put a warm, moist washcloth on your face 3 4 times a day, or as told by your doctor.  Use salt water sprays (saline sprays) to wet the thick fluid in your nose. This can help the sinuses drain.  Only take medicine as told by your doctor. GET HELP RIGHT AWAY IF:   Your pain gets worse.  You have very bad headaches.  You are sick to your stomach (nauseous).  You throw up (vomit).  You are very sleepy (drowsy) all the time.  Your face is puffy (swollen).  Your vision changes.  You have a stiff neck.  You have trouble breathing. MAKE SURE YOU:   Understand these instructions.  Will watch your condition.  Will get help right away if you are not doing well or get worse. Document Released: 07/07/2007 Document Revised: 10/13/2011 Document Reviewed: 08/24/2011 [facility] Patient Information 2014 [place]. Labyrinthitis (Inner Ear Inflammation) Your exam shows you have an inner ear disturbance or labyrinthitis. The cause of this condition is not known. But it may be due to a virus infection. The symptoms of labyrinthitis include vertigo or dizziness made worse by motion, nausea and vomiting. The onset of labyrinthitis may be very sudden. It usually lasts for a few days and then clears up over 1-2 weeks. The treatment of an inner ear disturbance  includes bed rest and medications to reduce dizziness, nausea, and vomiting. You should stay away from alcohol, tranquilizers, caffeine, nicotine, or any medicine your doctor thinks may make your symptoms worse. Further testing may be needed to evaluate your hearing and balance system. Please see your doctor or go to the emergency room right away if you have:  Increasing vertigo, earache, loss of hearing, or ear drainage.  Headache, blurred vision, trouble walking, fainting, or fever.  Persistent vomiting, dehydration, or extreme weakness. Document Released: 01/18/2005 Document Revised: 04/12/2011 Document Reviewed: 07/06/2006 [facility] Patient Information 2014 [place].

## 2013-08-16 ENCOUNTER — Ambulatory Visit (INDEPENDENT_AMBULATORY_CARE_PROVIDER_SITE_OTHER): Payer: BC Managed Care – PPO | Admitting: Internal Medicine

## 2013-08-16 ENCOUNTER — Encounter: Payer: Self-pay | Admitting: Internal Medicine

## 2013-08-16 VITALS — BP 120/90 | HR 104 | Temp 98.2°F | Wt 174.2 lb

## 2013-08-16 DIAGNOSIS — R059 Cough, unspecified: Secondary | ICD-10-CM

## 2013-08-16 DIAGNOSIS — J018 Other acute sinusitis: Secondary | ICD-10-CM

## 2013-08-16 DIAGNOSIS — R05 Cough: Secondary | ICD-10-CM

## 2013-08-16 MED ORDER — CEFUROXIME AXETIL 250 MG PO TABS: 250.0000 mg | ORAL_TABLET | Freq: Two times a day (BID) | ORAL | Status: DC

## 2013-08-16 MED ORDER — HYDROCODONE-HOMATROPINE 5-1.5 MG/5ML PO SYRP: 5.0000 mL | ORAL_SOLUTION | Freq: Four times a day (QID) | ORAL | Status: DC | PRN

## 2013-08-16 NOTE — Progress Notes (Signed)
Pre visit review using our clinic review tool, if applicable. No additional management support is needed unless otherwise documented below in the visit note. 

## 2013-08-16 NOTE — Progress Notes (Signed)
   Subjective:    Patient ID: Maria Frank, female    DOB: 1976/01/10, 38 y.o.   MRN: [id number]  HPI  Initial symptoms were nonproductive cough and head congestion 08/09/13. The symptoms have been persistent. A major complaint is left frontal and left facial discomfort with nasal purulence. She also describes otic discomfort on the left without discharge. She has felt hot but has not checked her temperature.  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    Review of Systems  She denies right frontal sinus pressure. She's had no otic discharge.  The cough is nonproductive and not associated with wheezing or shortness of breath  She has no significant extrinsic symptoms at this time of itchy, watery eyes, or sneezing.     Objective:   Physical Exam   Significant or distinguishing  findings on physical exam are documented first.  Below that are other systems examined & findings.  Intermittent esotropia of the right eye. Slight hyponasal speech pattern. Recheck of pulse revealed  regular rate of 96.  General appearance:good health ;well nourished; no acute distress or increased work of breathing is present.  No  lymphadenopathy about the head, neck, or axilla noted.  Eyes: No conjunctival inflammation or lid edema is present. There is no scleral icterus. Ears:  External ear exam shows no significant lesions or deformities.  Otoscopic examination reveals clear canals, tympanic membranes are intact bilaterally without bulging, retraction, inflammation or discharge.Good light reflex. Nose:  External nasal examination shows no deformity or inflammation. Nasal mucosa are dry without lesions or exudates. No septal dislocation or deviation.No obstruction to airflow.  Oral exam: Dental hygiene is good; lips and gums are healthy appearing.There is no oropharyngeal erythema or exudate noted.  Neck:  No deformities, thyromegaly, masses, or tenderness noted.   Supple with full  range of motion without pain.  Heart:  Normal rate and regular rhythm. S1 and S2 normal without gallop, murmur, click, rub or other extra sounds.  Lungs:Chest clear to auscultation; no wheezes, rhonchi,rales ,or rubs present.No increased work of breathing.   Extremities:  No cyanosis, edema, or clubbing  noted  Skin: Warm & dry w/o jaundice or tenting.         Assessment & Plan:  #1 left maxillary and frontal sinusitis with associated ear pain due to eustachian tube dysfunction  #2 cough secondary to postnasal drainage; acute bronchitis not present  See orders and recommendations.

## 2013-08-16 NOTE — Patient Instructions (Signed)

## 2013-08-16 NOTE — Progress Notes (Signed)
   Subjective:    Patient ID: Maria Frank, female    DOB: 20-Nov-1975, 38 y.o.   MRN: [id number]  HPI Pt's symptoms began 08/09/13 as head congestion and nonproductive cough. The pt's symptoms have been constant since this time. The head congestion is the main complaint. She reports nasal congestion, maxillarysinus pressure, frontal headaches and rhinorrhea of discolored sputum. She's also had otic pain and fullness bilaterally. She has felt febrile though she hasn't checked her temperature.   The pt believes the cough is due to her sinus drainage.   She denies sick exposures. She does have environmental allergies that she takes zyrtec and flonase for. She has tried tylenol and leftover codeine cough syrup, neither of which have helped.  Review of Systems  Constitutional: Negative for chills.  HENT: Negative for ear discharge, sneezing and sore throat.   Eyes: Negative for itching.  Respiratory: Negative for shortness of breath and wheezing.       Objective:   Physical Exam [place] or distant sounds  Tachycardic with normal rhythm  Tender sinuses to palpation  Nasal quality to voice     Assessment & Plan:  #1 sinusitis; consider augmentin or amoxicillin due to decreased duration.

## 2013-09-13 ENCOUNTER — Telehealth: Payer: Self-pay | Admitting: Internal Medicine

## 2013-09-13 NOTE — Telephone Encounter (Signed)
Patient Information:  Caller Name: Maria Frank  Phone: [phone]  Patient: Maria Frank  Gender: Female  DOB: 06-17-1975  Age: 38 Years  PCP: Gwendolyn Grant (Adults only)  Pregnant: No  Office Follow Up:  Does the office need to follow up with this patient?: Yes  Instructions For The Office: Please review and contact family. [phone]  RN Note:  Question-  found dead bat in cabinet.  Dishes had film residue on them. Animal control has responded.  Concern for expouse to [facility] Droppings/Bat.  Reviewed CDC. Concern for Histoplasmosis.  Family is not ill or signs of illness. Please contact regarding issues and guidance.  Symptoms  Reason For Call & Symptoms: Mr. and Mrs. Klemann went of vacation for two weeks and returned Sunday night 09/09/13.  He noted an odor in cabinets/kitchen.  They also noted that the  dishes in cabient were dirty over the next few days.  The smell got stronger. . They pulled the dishes out of the cabinet last night and. They found a dead bat in the corner.  They are afraid they came in contact with excrement .  Animal control did respond and get the bat. Unsure after all this time if there can be testing.  They are concerned about home and themseleves.  No illness .  Poison Control advised- call animal control. Have house treated and tested.  They are concerned about ingestion?  feces etc.  Reviewed Health History In EMR: Yes  Reviewed Medications In EMR: Yes  Reviewed Allergies In EMR: Yes  Reviewed Surgeries / Procedures: Yes  Date of Onset of Symptoms: 08/27/2013 OB / GYN:  LMP: Unknown  Guideline(s) Used:  Animal Bite  Disposition Per Guideline:   See Today in Office  Reason For Disposition Reached:   No bite mark and suspicious bat exposure (e.g., bat found in same room as sleeping adult)  Advice Given:  N/A  RN Overrode Recommendation:  Document Patient  Please review and contact family. [phone]

## 2013-09-14 NOTE — Telephone Encounter (Signed)
Notified pt spoke with husband gave md response....Maria Frank

## 2013-09-14 NOTE — Telephone Encounter (Signed)
I have reviewed the UpTo Date guidelines on this subject - There appears to be minimal risk for exposure, especially as histoplasmosis is not common in our geographic region. No treatment of any kind is needed - clean as per routine guidelines  thanks

## 2013-09-26 ENCOUNTER — Other Ambulatory Visit: Payer: Self-pay | Admitting: Allergy and Immunology

## 2013-09-26 DIAGNOSIS — J329 Chronic sinusitis, unspecified: Secondary | ICD-10-CM

## 2013-10-05 ENCOUNTER — Ambulatory Visit
Admission: RE | Admit: 2013-10-05 | Discharge: 2013-10-05 | Disposition: A | Discharge status: Home / Self Care | Payer: BC Managed Care – PPO | Source: Ambulatory Visit | Attending: Allergy and Immunology | Admitting: Allergy and Immunology

## 2013-10-05 DIAGNOSIS — J329 Chronic sinusitis, unspecified: Secondary | ICD-10-CM

## 2013-10-15 ENCOUNTER — Telehealth: Payer: Self-pay | Admitting: Internal Medicine

## 2013-10-15 NOTE — Telephone Encounter (Signed)
Patient called stating her pharmacy told her to call us for new rx for alprazolam.  CB# [phone]

## 2013-10-16 ENCOUNTER — Other Ambulatory Visit: Payer: Self-pay

## 2013-10-16 MED ORDER — ALPRAZOLAM 0.5 MG PO TABS: 0.2500 mg | ORAL_TABLET | Freq: Three times a day (TID) | ORAL | Status: DC | PRN

## 2013-10-16 NOTE — Telephone Encounter (Signed)
rx sent to pharm

## 2013-11-08 ENCOUNTER — Encounter: Payer: Self-pay | Admitting: Gynecology

## 2013-11-08 ENCOUNTER — Ambulatory Visit (INDEPENDENT_AMBULATORY_CARE_PROVIDER_SITE_OTHER): Payer: BC Managed Care – PPO | Admitting: Gynecology

## 2013-11-08 VITALS — BP 116/74 | Ht 64.0 in | Wt 163.6 lb

## 2013-11-08 DIAGNOSIS — Z01419 Encounter for gynecological examination (general) (routine) without abnormal findings: Secondary | ICD-10-CM

## 2013-11-08 DIAGNOSIS — Z8781 Personal history of (healed) traumatic fracture: Secondary | ICD-10-CM

## 2013-11-08 NOTE — Progress Notes (Signed)
Maria Frank [birth year] [id number]   History:    38 y.o.  for annual gyn exam with no complaints today. Patient stated that she received her flu vaccine yesterday at her immunologist office. Patient had a ParaGard T380A IUD placed 12/01/2012. Patient is having normal menstrual cycles. Patient brought to my attention that last year while standing fell and had a simple fragility fracture of her metatarsal of her right foot. Patient states that her mother has history of osteoporosis. She is taking calcium and vitamin D. Patient with no past history of abnormal Pap smears. Patient received the Tdap vaccine in 2014.  Past medical history,surgical history, family history and social history were all reviewed and documented in the EPIC chart.  Gynecologic History Patient's last menstrual period was 10/08/2013. Contraception: IUD Last Pap: 2014. Results were: normal Last mammogram: Not indicated. Results were: Not indicated  Obstetric History OB History  No data available     ROS: A ROS was performed and pertinent positives and negatives are included in the history.  GENERAL: No fevers or chills. HEENT: No change in vision, no earache, sore throat or sinus congestion. NECK: No pain or stiffness. CARDIOVASCULAR: No chest pain or pressure. No palpitations. PULMONARY: No shortness of breath, cough or wheeze. GASTROINTESTINAL: No abdominal pain, nausea, vomiting or diarrhea, melena or bright red blood per rectum. GENITOURINARY: No urinary frequency, urgency, hesitancy or dysuria. MUSCULOSKELETAL: No joint or muscle pain, no back pain, no recent trauma. DERMATOLOGIC: No rash, no itching, no lesions. ENDOCRINE: No polyuria, polydipsia, no heat or cold intolerance. No recent change in weight. HEMATOLOGICAL: No anemia or easy bruising or bleeding. NEUROLOGIC: No headache, seizures, numbness, tingling or weakness. PSYCHIATRIC: No depression, no loss of interest in normal activity or change in sleep pattern.     Exam: chaperone present  BP 116/74  Ht 5\' 4"  (1.626 m)  Wt 163 lb 9.6 oz (74.208 kg)  BMI 28.07 kg/m2  LMP 10/08/2013  Body mass index is 28.07 kg/(m^2).  General appearance : Well developed well nourished female. No acute distress HEENT: Neck supple, trachea midline, no carotid bruits, no thyroidmegaly Lungs: Clear to auscultation, no rhonchi or wheezes, or rib retractions  Heart: Regular rate and rhythm, no murmurs or gallops Breast:Examined in sitting and supine position were symmetrical in appearance, no palpable masses or tenderness,  no skin retraction, no nipple inversion, no nipple discharge, no skin discoloration, no axillary or supraclavicular lymphadenopathy Abdomen: no palpable masses or tenderness, no rebound or guarding Extremities: no edema or skin discoloration or tenderness  Pelvic:  Bartholin, Urethra, Skene Glands: Within normal limits             Vagina: No gross lesions or discharge  Cervix: No gross lesions or discharge, IUD string seen and trimmed  Uterus  anteverted, normal size, shape and consistency, non-tender and mobile  Adnexa  Without masses or tenderness  Anus and perineum  normal   Rectovaginal  normal sphincter tone without palpated masses or tenderness             Hemoccult not indicated     Assessment/Plan:  38 y.o. female for annual exam will be scheduled for a bone density study due to the fact that she had a fragility fracture in 2014. She was encouraged to take her calcium and vitamin D and regular exercise for osteoporosis prevention. Her PCP will be drawn her blood work in the next few weeks and I have asked her to remind her to check a  vitamin D level as well. We discussed importance of monthly self breast examination. Pap smear not indicated this year.   Terrance Mass MD, 9:16 AM 11/08/2013

## 2013-12-06 ENCOUNTER — Ambulatory Visit (INDEPENDENT_AMBULATORY_CARE_PROVIDER_SITE_OTHER): Payer: BC Managed Care – PPO

## 2013-12-06 ENCOUNTER — Other Ambulatory Visit: Payer: Self-pay | Admitting: Gynecology

## 2013-12-06 DIAGNOSIS — Z8781 Personal history of (healed) traumatic fracture: Secondary | ICD-10-CM

## 2013-12-06 DIAGNOSIS — M8580 Other specified disorders of bone density and structure, unspecified site: Secondary | ICD-10-CM

## 2014-03-26 ENCOUNTER — Ambulatory Visit (INDEPENDENT_AMBULATORY_CARE_PROVIDER_SITE_OTHER): Payer: BC Managed Care – PPO | Admitting: Internal Medicine

## 2014-03-26 ENCOUNTER — Encounter: Payer: Self-pay | Admitting: Internal Medicine

## 2014-03-26 ENCOUNTER — Other Ambulatory Visit (INDEPENDENT_AMBULATORY_CARE_PROVIDER_SITE_OTHER): Payer: BC Managed Care – PPO

## 2014-03-26 VITALS — BP 120/82 | HR 76 | Temp 98.3°F | Ht 64.0 in | Wt 173.0 lb

## 2014-03-26 DIAGNOSIS — F329 Major depressive disorder, single episode, unspecified: Secondary | ICD-10-CM

## 2014-03-26 DIAGNOSIS — F320 Major depressive disorder, single episode, mild: Secondary | ICD-10-CM

## 2014-03-26 DIAGNOSIS — Z0000 Encounter for general adult medical examination without abnormal findings: Secondary | ICD-10-CM

## 2014-03-26 DIAGNOSIS — Z13820 Encounter for screening for osteoporosis: Secondary | ICD-10-CM

## 2014-03-26 DIAGNOSIS — F32A Depression, unspecified: Secondary | ICD-10-CM

## 2014-03-26 LAB — HEPATIC FUNCTION PANEL
ALK PHOS: 46 U/L (ref 39–117)
ALT: 11 U/L (ref 0–35)
AST: 18 U/L (ref 0–37)
Albumin: 4.2 g/dL (ref 3.5–5.2)
Bilirubin, Direct: 0.1 mg/dL (ref 0.0–0.3)
TOTAL PROTEIN: 7.4 g/dL (ref 6.0–8.3)
Total Bilirubin: 0.4 mg/dL (ref 0.2–1.2)

## 2014-03-26 LAB — CBC WITH DIFFERENTIAL/PLATELET
Basophils Absolute: 0 10*3/uL (ref 0.0–0.1)
Basophils Relative: 0.4 % (ref 0.0–3.0)
EOS ABS: 0.1 10*3/uL (ref 0.0–0.7)
Eosinophils Relative: 0.7 % (ref 0.0–5.0)
HCT: 38.8 % (ref 36.0–46.0)
Hemoglobin: 13.2 g/dL (ref 12.0–15.0)
Lymphocytes Relative: 30.6 % (ref 12.0–46.0)
Lymphs Abs: 2.3 10*3/uL (ref 0.7–4.0)
MCHC: 34 g/dL (ref 30.0–36.0)
MCV: 91.1 fl (ref 78.0–100.0)
Monocytes Absolute: 0.6 10*3/uL (ref 0.1–1.0)
Monocytes Relative: 7.4 % (ref 3.0–12.0)
NEUTROS ABS: 4.6 10*3/uL (ref 1.4–7.7)
NEUTROS PCT: 60.9 % (ref 43.0–77.0)
PLATELETS: 217 10*3/uL (ref 150.0–400.0)
RBC: 4.25 Mil/uL (ref 3.87–5.11)
RDW: 13 % (ref 11.5–15.5)
WBC: 7.5 10*3/uL (ref 4.0–10.5)

## 2014-03-26 LAB — LIPID PANEL
Cholesterol: 213 mg/dL — ABNORMAL HIGH (ref 0–200)
HDL: 70.6 mg/dL (ref >39.00)
LDL CALC: 129 mg/dL — AB (ref 0–99)
NonHDL: 142.4
TRIGLYCERIDES: 66 mg/dL (ref 0.0–149.0)
Total CHOL/HDL Ratio: 3
VLDL: 13.2 mg/dL (ref 0.0–40.0)

## 2014-03-26 LAB — VITAMIN D 25 HYDROXY (VIT D DEFICIENCY, FRACTURES): VITD: 22.75 ng/mL — ABNORMAL LOW (ref 30.00–100.00)

## 2014-03-26 LAB — BASIC METABOLIC PANEL
BUN: 11 mg/dL (ref 6–23)
CALCIUM: 9.3 mg/dL (ref 8.4–10.5)
CO2: 26 mEq/L (ref 19–32)
CREATININE: 0.71 mg/dL (ref 0.40–1.20)
Chloride: 105 mEq/L (ref 96–112)
GFR: 96.94 mL/min (ref >60.00)
Glucose, Bld: 84 mg/dL (ref 70–99)
Potassium: 4.4 mEq/L (ref 3.5–5.1)
Sodium: 137 mEq/L (ref 135–145)

## 2014-03-26 LAB — TSH: TSH: 0.93 u[IU]/mL (ref 0.35–4.50)

## 2014-03-26 MED ORDER — LORATADINE 10 MG PO TABS: 10.0000 mg | ORAL_TABLET | Freq: Every day | ORAL | Status: DC

## 2014-03-26 MED ORDER — LORAZEPAM 0.5 MG PO TABS: 0.2500 mg | ORAL_TABLET | Freq: Two times a day (BID) | ORAL | Status: DC | PRN

## 2014-03-26 MED ORDER — EPIPEN 2-PAK 0.3 MG/0.3ML IJ SOAJ: 0.3000 mg | Freq: Once | INTRAMUSCULAR | Status: DC | PRN

## 2014-03-26 MED ORDER — CALCIUM-MAGNESIUM-VITAMIN D ER 600-40-500 MG-MG-UNIT PO TB24: 1.0000 | ORAL_TABLET | Freq: Two times a day (BID) | ORAL | Status: DC

## 2014-03-26 NOTE — Progress Notes (Signed)
Pre visit review using our clinic review tool, if applicable. No additional management support is needed unless otherwise documented below in the visit note. 

## 2014-03-26 NOTE — Patient Instructions (Addendum)
It was good to see you today.  We have reviewed your prior records including labs and tests today  Health Maintenance reviewed - all recommended immunizations and age-appropriate screenings are up-to-date.  Test(s) ordered today. Your results will be released to [place] (or called to you) after review, usually within 72hours after test completion. If any changes need to be made, you will be notified at that same time.  Medications reviewed and updated, no changes recommended at this time.  Please schedule followup in 12 months for annual exam and labs, call sooner if problems.  Health Maintenance Adopting a healthy lifestyle and getting preventive care can go a long way to promote health and wellness. Talk with your health care provider about what schedule of regular examinations is right for you. This is a good chance for you to check in with your provider about disease prevention and staying healthy. In between checkups, there are plenty of things you can do on your own. Experts have done a lot of research about which lifestyle changes and preventive measures are most likely to keep you healthy. Ask your health care provider for more information. WEIGHT AND DIET  Eat a healthy diet  Be sure to include plenty of vegetables, fruits, low-fat dairy products, and lean protein.  Do not eat a lot of foods high in solid fats, added sugars, or salt.  Get regular exercise. This is one of the most important things you can do for your health.  Most adults should exercise for at least 150 minutes each week. The exercise should increase your heart rate and make you sweat (moderate-intensity exercise).  Most adults should also do strengthening exercises at least twice a week. This is in addition to the moderate-intensity exercise.  Maintain a healthy weight  Body mass index (BMI) is a measurement that can be used to identify possible weight problems. It estimates body fat based on height and weight.  Your health care provider can help determine your BMI and help you achieve or maintain a healthy weight.  For females 20 years of age and older:   A BMI below 18.5 is considered underweight.  A BMI of 18.5 to 24.9 is normal.  A BMI of 25 to 29.9 is considered overweight.  A BMI of 30 and above is considered obese.  Watch levels of cholesterol and blood lipids  You should start having your blood tested for lipids and cholesterol at 40 years of age, then have this test every 5 years.  You may need to have your cholesterol levels checked more often if:  Your lipid or cholesterol levels are high.  You are older than 40 years of age.  You are at high risk for heart disease.  CANCER SCREENING   Lung Cancer  Lung cancer screening is recommended for adults 40-59 years old who are at high risk for lung cancer because of a history of smoking.  A yearly low-dose CT scan of the lungs is recommended for people who:  Currently smoke.  Have quit within the past 15 years.  Have at least a 30-pack-year history of smoking. A pack year is smoking an average of one pack of cigarettes a day for 1 year.  Yearly screening should continue until it has been 15 years since you quit.  Yearly screening should stop if you develop a health problem that would prevent you from having lung cancer treatment.  Breast Cancer  Practice breast self-awareness. This means understanding how your breasts normally appear and  feel.  It also means doing regular breast self-exams. Let your health care provider know about any changes, no matter how small.  If you are in your 40s or 30s, you should have a clinical breast exam (CBE) by a health care provider every 1-3 years as part of a regular health exam.  If you are 40 or older, have a CBE every year. Also consider having a breast X-ray (mammogram) every year.  If you have a family history of breast cancer, talk to your health care provider about genetic  screening.  If you are at high risk for breast cancer, talk to your health care provider about having an MRI and a mammogram every year.  Breast cancer gene (BRCA) assessment is recommended for women who have family members with BRCA-related cancers. BRCA-related cancers include:  Breast.  Ovarian.  Tubal.  Peritoneal cancers.  Results of the assessment will determine the need for genetic counseling and BRCA1 and BRCA2 testing. Cervical Cancer Routine pelvic examinations to screen for cervical cancer are no longer recommended for nonpregnant women who are considered low risk for cancer of the pelvic organs (ovaries, uterus, and vagina) and who do not have symptoms. A pelvic examination may be necessary if you have symptoms including those associated with pelvic infections. Ask your health care provider if a screening pelvic exam is right for you.   The Pap test is the screening test for cervical cancer for women who are considered at risk.  If you had a hysterectomy for a problem that was not cancer or a condition that could lead to cancer, then you no longer need Pap tests.  If you are older than 65 years, and you have had normal Pap tests for the past 10 years, you no longer need to have Pap tests.  If you have had past treatment for cervical cancer or a condition that could lead to cancer, you need Pap tests and screening for cancer for at least 20 years after your treatment.  If you no longer get a Pap test, assess your risk factors if they change (such as having a new sexual partner). This can affect whether you should start being screened again.  Some women have medical problems that increase their chance of getting cervical cancer. If this is the case for you, your health care provider may recommend more frequent screening and Pap tests.  The human papillomavirus (HPV) test is another test that may be used for cervical cancer screening. The HPV test looks for the virus that can  cause cell changes in the cervix. The cells collected during the Pap test can be tested for HPV.  The HPV test can be used to screen women 40 years of age and older. Getting tested for HPV can extend the interval between normal Pap tests from three to five years.  An HPV test also should be used to screen women of any age who have unclear Pap test results.  After 40 years of age, women should have HPV testing as often as Pap tests.  Colorectal Cancer  This type of cancer can be detected and often prevented.  Routine colorectal cancer screening usually begins at 40 years of age and continues through 40 years of age.  Your health care provider may recommend screening at an earlier age if you have risk factors for colon cancer.  Your health care provider may also recommend using home test kits to check for hidden blood in the stool.  A small camera   at the end of a tube can be used to examine your colon directly (sigmoidoscopy or colonoscopy). This is done to check for the earliest forms of colorectal cancer.  Routine screening usually begins at age 50.  Direct examination of the colon should be repeated every 5-10 years through 40 years of age. However, you may need to be screened more often if early forms of precancerous polyps or small growths are found. Skin Cancer  Check your skin from head to toe regularly.  Tell your health care provider about any new moles or changes in moles, especially if there is a change in a mole's shape or color.  Also tell your health care provider if you have a mole that is larger than the size of a pencil eraser.  Always use sunscreen. Apply sunscreen liberally and repeatedly throughout the day.  Protect yourself by wearing long sleeves, pants, a wide-brimmed hat, and sunglasses whenever you are outside. HEART DISEASE, DIABETES, AND HIGH BLOOD PRESSURE   Have your blood pressure checked at least every 1-2 years. High blood pressure causes heart  disease and increases the risk of stroke.  If you are between 55 years and 79 years old, ask your health care provider if you should take aspirin to prevent strokes.  Have regular diabetes screenings. This involves taking a blood sample to check your fasting blood sugar level.  If you are at a normal weight and have a low risk for diabetes, have this test once every three years after 40 years of age.  If you are overweight and have a high risk for diabetes, consider being tested at a younger age or more often. PREVENTING INFECTION  Hepatitis B  If you have a higher risk for hepatitis B, you should be screened for this virus. You are considered at high risk for hepatitis B if:  You were born in a country where hepatitis B is common. Ask your health care provider which countries are considered high risk.  Your parents were born in a high-risk country, and you have not been immunized against hepatitis B (hepatitis B vaccine).  You have HIV or AIDS.  You use needles to inject street drugs.  You live with someone who has hepatitis B.  You have had sex with someone who has hepatitis B.  You get hemodialysis treatment.  You take certain medicines for conditions, including cancer, organ transplantation, and autoimmune conditions. Hepatitis C  Blood testing is recommended for:  Everyone born from 1945 through 1965.  Anyone with known risk factors for hepatitis C. Sexually transmitted infections (STIs)  You should be screened for sexually transmitted infections (STIs) including gonorrhea and chlamydia if:  You are sexually active and are younger than 40 years of age.  You are older than 40 years of age and your health care provider tells you that you are at risk for this type of infection.  Your sexual activity has changed since you were last screened and you are at an increased risk for chlamydia or gonorrhea. Ask your health care provider if you are at risk.  If you do not have  HIV, but are at risk, it may be recommended that you take a prescription medicine daily to prevent HIV infection. This is called pre-exposure prophylaxis (PrEP). You are considered at risk if:  You are sexually active and do not regularly use condoms or know the HIV status of your partner(s).  You take drugs by injection.  You are sexually active with a partner   who has HIV. Talk with your health care provider about whether you are at high risk of being infected with HIV. If you choose to begin PrEP, you should first be tested for HIV. You should then be tested every 3 months for as long as you are taking PrEP.  PREGNANCY   If you are premenopausal and you may become pregnant, ask your health care provider about preconception counseling.  If you may become pregnant, take 400 to 800 micrograms (mcg) of folic acid every day.  If you want to prevent pregnancy, talk to your health care provider about birth control (contraception). OSTEOPOROSIS AND MENOPAUSE   Osteoporosis is a disease in which the bones lose minerals and strength with aging. This can result in serious bone fractures. Your risk for osteoporosis can be identified using a bone density scan.  If you are 65 years of age or older, or if you are at risk for osteoporosis and fractures, ask your health care provider if you should be screened.  Ask your health care provider whether you should take a calcium or vitamin D supplement to lower your risk for osteoporosis.  Menopause may have certain physical symptoms and risks.  Hormone replacement therapy may reduce some of these symptoms and risks. Talk to your health care provider about whether hormone replacement therapy is right for you.  HOME CARE INSTRUCTIONS   Schedule regular health, dental, and eye exams.  Stay current with your immunizations.   Do not use any tobacco products including cigarettes, chewing tobacco, or electronic cigarettes.  If you are pregnant, do not  drink alcohol.  If you are breastfeeding, limit how much and how often you drink alcohol.  Limit alcohol intake to no more than 1 drink per day for nonpregnant women. One drink equals 12 ounces of beer, 5 ounces of wine, or 1 ounces of hard liquor.  Do not use street drugs.  Do not share needles.  Ask your health care provider for help if you need support or information about quitting drugs.  Tell your health care provider if you often feel depressed.  Tell your health care provider if you have ever been abused or do not feel safe at home. Document Released: 08/03/2010 Document Revised: 06/04/2013 Document Reviewed: 12/20/2012 [employer] Patient Information 2015 [employer]. This information is not intended to replace advice given to you by your health care provider. Make sure you discuss any questions you have with your health care provider.  

## 2014-03-26 NOTE — Progress Notes (Signed)
Subjective:    Patient ID: Timoteo Ace, female    DOB: [birth year], 39 y.o.   MRN: [id number]  HPI  patient is here today for annual physical. Patient feels well overall. Also reviewed chronic medical issues and interval medical events   Past Medical History  Diagnosis Date  . ARTHRITIS   . CHICKENPOX, HX OF   . Migraine   . IBS (irritable bowel syndrome)   . ALLERGIC RHINITIS   . HYPERLIPIDEMIA   . DEPRESSION    Family History  Problem Relation Age of Onset  . Hypertension Father   . Hypertension Paternal Grandfather   . Arthritis Other     parent & grandparents  . Breast cancer Maternal Aunt 60  . Cancer Mother     pancreatic, liver and renal   . Breast cancer Cousin 11  . Osteoporosis Mother   . Cardiomyopathy Father    History  Substance Use Topics  . Smoking status: Never Smoker   . Smokeless tobacco: Not on file  . Alcohol Use: 0.0 oz/week    0 Standard drinks or equivalent per week     Comment: Social    Review of Systems  Constitutional: Negative for fatigue and unexpected weight change.  Respiratory: Negative for cough, shortness of breath and wheezing.   Cardiovascular: Negative for chest pain, palpitations and leg swelling.  Gastrointestinal: Negative for nausea, abdominal pain and diarrhea.  Neurological: Negative for dizziness, weakness, light-headedness and headaches.  Psychiatric/Behavioral: Positive for sleep disturbance (chronic, unchanged) and dysphoric mood. Negative for suicidal ideas, self-injury and decreased concentration. The patient is not nervous/anxious.   All other systems reviewed and are negative.      Objective:    Physical Exam  Constitutional: She is oriented to person, place, and time. She appears well-developed and well-nourished. No distress.  HENT:  Head: Normocephalic and atraumatic.  Right Ear: External ear normal.  Left Ear: External ear normal.  Nose: Nose normal.  Mouth/Throat: Oropharynx is clear and moist. No  oropharyngeal exudate.  Eyes: EOM are normal. Pupils are equal, round, and reactive to light. Right eye exhibits no discharge. Left eye exhibits no discharge. No scleral icterus.  Neck: Normal range of motion. Neck supple. No JVD present. No tracheal deviation present. No thyromegaly present.  Cardiovascular: Normal rate, regular rhythm, normal heart sounds and intact distal pulses.  Exam reveals no friction rub.   No murmur heard. Pulmonary/Chest: Effort normal and breath sounds normal. No respiratory distress. She has no wheezes. She has no rales. She exhibits no tenderness.  Abdominal: Soft. Bowel sounds are normal. She exhibits no distension and no mass. There is no tenderness. There is no rebound and no guarding.  Genitourinary:  Defer to gyn  Musculoskeletal: Normal range of motion.  No gross deformities  Lymphadenopathy:    She has no cervical adenopathy.  Neurological: She is alert and oriented to person, place, and time. She has normal reflexes. No cranial nerve deficit.  Skin: Skin is warm and dry. No rash noted. She is not diaphoretic. No erythema.  Psychiatric: She has a normal mood and affect. Her behavior is normal. Judgment and thought content normal.  Nursing note and vitals reviewed.   BP 120/82 mmHg  Pulse 76  Temp(Src) 98.3 F (36.8 C) (Oral)  Ht 5\' 4"  (1.626 m)  Wt 173 lb (78.472 kg)  BMI 29.68 kg/m2  SpO2 95% Wt Readings from Last 3 Encounters:  03/26/14 173 lb (78.472 kg)  11/08/13 163 lb 9.6 oz (74.208  kg)  08/16/13 174 lb 3.2 oz (79.017 kg)     Lab Results  Component Value Date   WBC 10.8* 10/09/2012   HGB 13.1 10/09/2012   HCT 38.7 10/09/2012   PLT 233.0 10/09/2012   GLUCOSE 83 10/09/2012   CHOL 201* 10/09/2012   TRIG 90.0 10/09/2012   HDL 61.80 10/09/2012   LDLDIRECT 131.3 10/09/2012   LDLCALC 109* 11/17/2010   ALT 15 10/09/2012   AST 20 10/09/2012   NA 138 10/09/2012   K 4.1 10/09/2012   CL 107 10/09/2012   CREATININE 0.7 10/09/2012   BUN  12 10/09/2012   CO2 26 10/09/2012   TSH 2.12 10/09/2012    Ct Maxillofacial Wo Cm  10/05/2013   CLINICAL DATA:  Chronic sinusitis  EXAM: CT MAXILLOFACIAL WITHOUT CONTRAST  TECHNIQUE: Multidetector CT imaging of the maxillofacial structures was performed. Multiplanar CT image reconstructions were also generated. A small metallic BB was placed on the right temple in order to reliably differentiate right from left.  COMPARISON:  None.  FINDINGS: The paranasal sinuses are clear without mucosal thickening or fluid. The ostiomeatal complexes and sphenoethmoidal recesses are patent. No osseous sinus wall thickening or dehiscence is identified. Frontal sinus not well aerated.  Mild leftward deviation the nasal septum. Mastoid air cells are clear. Orbits are unremarkable. Visualized portion of the brain is unremarkable.  IMPRESSION: Grossly unremarkable paranasal sinus CT.   Electronically Signed   By: Lovey Newcomer M.D.   On: 10/05/2013 08:44       Assessment & Plan:   CPX/z00.00 - Patient has been counseled on age-appropriate routine health concerns for screening and prevention. These are reviewed and up-to-date. Immunizations are up-to-date or declined. Labs ordered and reviewed.  Problem List Items Addressed This Visit    Mild depression    Exacerbated symptoms due to strain on marriage chronic insomnia - using alpraz but not working - change to lorazepam prn (ambein caused night terrors in past) - also may continue melatonin prn Continue ongoing counseling No SI/HI Support offered      Relevant Medications   LORazepam (ATIVAN) tablet    Other Visit Diagnoses    Routine general medical examination at a health care facility    -  Primary    Relevant Orders    Basic metabolic panel    CBC with Differential/Platelet    Hepatic function panel    Lipid panel    TSH    Urinalysis, Routine w reflex microscopic    Osteoporosis screening        Relevant Orders    Vit D  25 hydroxy (rtn  osteoporosis monitoring)        Gwendolyn Grant, MD

## 2014-03-26 NOTE — Assessment & Plan Note (Signed)
Exacerbated symptoms due to strain on marriage chronic insomnia - using alpraz but not working - change to lorazepam prn (ambein caused night terrors in past) - also may continue melatonin prn Continue ongoing counseling No SI/HI Support offered

## 2014-04-02 LAB — URINALYSIS, ROUTINE W REFLEX MICROSCOPIC
Bilirubin Urine: NEGATIVE
Hgb urine dipstick: NEGATIVE
KETONES UR: NEGATIVE
Leukocytes, UA: NEGATIVE
Nitrite: NEGATIVE
Specific Gravity, Urine: 1.025 (ref 1.000–1.030)
Total Protein, Urine: NEGATIVE
URINE GLUCOSE: NEGATIVE
UROBILINOGEN UA: 0.2 (ref 0.0–1.0)
WBC, UA: NONE SEEN (ref 0–?)
pH: 6 (ref 5.0–8.0)

## 2014-11-11 ENCOUNTER — Ambulatory Visit (INDEPENDENT_AMBULATORY_CARE_PROVIDER_SITE_OTHER): Payer: BC Managed Care – PPO | Admitting: Gynecology

## 2014-11-11 ENCOUNTER — Encounter: Payer: Self-pay | Admitting: Gynecology

## 2014-11-11 VITALS — BP 126/78 | Ht 63.75 in | Wt 178.0 lb

## 2014-11-11 DIAGNOSIS — Z01419 Encounter for gynecological examination (general) (routine) without abnormal findings: Secondary | ICD-10-CM

## 2014-11-11 NOTE — Progress Notes (Signed)
Maria Frank 1976-01-11 [id number]   History:    39 y.o.  who presented today for her annual exam was asymptomatic. 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 She's currently taking calcium 1200 mg daily and vitamin D 2000 units daily. Patient had a fragility fracture at the age of 67 and her right foot.  Patient has informed me that her mother also had osteoporosis but she also had been diagnosed with renal cancer at the age of 40 and later had metastatic pancreatic cancer 79 and subsequently liver metastases and is now deceased.  Patient's currently with the ParaGard T380A IUD for contraception reports normal menstrual cycle. No past history of any abnormal Pap smears. Patient has not had her baseline mammogram yet.   Past medical history,surgical history, family history and social history were all reviewed and documented in the EPIC chart.  Gynecologic History Patient's last menstrual period was 11/01/2014. Contraception: IUD Last Pap: 2014. Results were: normal Last mammogram: No previous study. Results were: No previous study  Obstetric History OB History  Gravida Para Term Preterm AB SAB TAB Ectopic Multiple Living  0 0 0 0 0 0 0 0 0 0          ROS: A ROS was performed and pertinent positives and negatives are included in the history.  GENERAL: No fevers or chills. HEENT: No change in vision, no earache, sore throat or sinus congestion. NECK: No pain or stiffness. CARDIOVASCULAR: No chest pain or pressure. No palpitations. PULMONARY: No shortness of breath, cough or wheeze. GASTROINTESTINAL: No abdominal pain, nausea, vomiting or diarrhea, melena or bright red blood per rectum. GENITOURINARY: No urinary frequency, urgency, hesitancy or  dysuria. MUSCULOSKELETAL: No joint or muscle pain, no back pain, no recent trauma. DERMATOLOGIC: No rash, no itching, no lesions. ENDOCRINE: No polyuria, polydipsia, no heat or cold intolerance. No recent change in weight. HEMATOLOGICAL: No anemia or easy bruising or bleeding. NEUROLOGIC: No headache, seizures, numbness, tingling or weakness. PSYCHIATRIC: No depression, no loss of interest in normal activity or change in sleep pattern.     Exam: chaperone present  BP 126/78 mmHg  Ht 5' 3.75" (1.619 m)  Wt 178 lb (80.74 kg)  BMI 30.80 kg/m2  LMP 11/01/2014  Body mass index is 30.8 kg/(m^2).  General appearance : Well developed well nourished female. No acute distress HEENT: Eyes: no retinal hemorrhage or exudates,  Neck supple, trachea midline, no carotid bruits, no thyroidmegaly Lungs: Clear to auscultation, no rhonchi or wheezes, or rib retractions  Heart: Regular rate and rhythm, no murmurs or gallops Breast:Examined in sitting and supine position were symmetrical in appearance, no palpable masses or tenderness,  no skin retraction, no nipple inversion, no nipple discharge, no skin discoloration, no axillary or supraclavicular lymphadenopathy Abdomen: no palpable masses or tenderness, no rebound or guarding Extremities: no edema or skin discoloration or tenderness  Pelvic:  Bartholin, Urethra, Skene Glands: Within normal limits             Vagina: No gross lesions or discharge  Cervix: No gross lesions or discharge  Uterus  anteverted, normal size, shape and consistency, non-tender and mobile  Adnexa  Without masses or tenderness  Anus and perineum  normal   Rectovaginal  normal sphincter tone without palpated  masses or tenderness             Hemoccult not indicated     Assessment/Plan:  39 y.o. female for annual exam will be referred to the urologist for further consultation since her mother had renal cancer at such an early age of 75 for possible early screening. Patient will  need a bone density study in 2018. She was encouraged to continue her calcium vitamin D and weightbearing exercises. Pap smear not indicated this year. Her PCP had done her blood work early this year. She was provided with requisition to schedule her mammogram. Patient received the flu vaccine today.   Terrance Mass MD, 9:45 AM 11/11/2014

## 2014-11-12 ENCOUNTER — Other Ambulatory Visit: Payer: Self-pay

## 2014-11-12 DIAGNOSIS — Z1231 Encounter for screening mammogram for malignant neoplasm of breast: Secondary | ICD-10-CM

## 2014-12-11 ENCOUNTER — Ambulatory Visit
Admission: RE | Admit: 2014-12-11 | Discharge: 2014-12-11 | Disposition: A | Discharge status: Home / Self Care | Payer: BC Managed Care – PPO | Source: Ambulatory Visit

## 2014-12-11 DIAGNOSIS — Z1231 Encounter for screening mammogram for malignant neoplasm of breast: Secondary | ICD-10-CM

## 2015-02-01 ENCOUNTER — Encounter: Payer: Self-pay | Admitting: Internal Medicine

## 2015-02-01 ENCOUNTER — Ambulatory Visit (INDEPENDENT_AMBULATORY_CARE_PROVIDER_SITE_OTHER): Payer: BC Managed Care – PPO | Admitting: Internal Medicine

## 2015-02-01 VITALS — BP 110/70 | HR 91 | Temp 97.7°F | Ht 63.75 in | Wt 180.0 lb

## 2015-02-01 DIAGNOSIS — J0100 Acute maxillary sinusitis, unspecified: Secondary | ICD-10-CM

## 2015-02-01 MED ORDER — AMOXICILLIN-POT CLAVULANATE 875-125 MG PO TABS: 1.0000 | ORAL_TABLET | Freq: Two times a day (BID) | ORAL | Status: DC

## 2015-02-01 NOTE — Patient Instructions (Signed)

## 2015-02-01 NOTE — Progress Notes (Signed)
HPI  Pt presents to the clinic today with c/o headache, facial pressure, ear fullness, nasal congestion, sore throat and cough. This started 1 week ago. She is not able to blow anything out of her nose. The cough is nonproductive. She denies shortness of breath. She has run low grade fevers. She has tried Sudafed with minimal relief. She does have a history of allergies and takes Claritin and Nasonex daily. She has had sick contacts.  Review of Systems    Past Medical History  Diagnosis Date  . ARTHRITIS   . CHICKENPOX, HX OF   . Migraine   . IBS (irritable bowel syndrome)   . ALLERGIC RHINITIS   . HYPERLIPIDEMIA   . DEPRESSION     Family History  Problem Relation Age of Onset  . Hypertension Father   . Hypertension Paternal Grandfather   . Arthritis Other     parent & grandparents  . Breast cancer Maternal Aunt 60  . Cancer Mother     pancreatic, liver and renal   . Breast cancer Cousin 3  . Osteoporosis Mother   . Cardiomyopathy Father     Social History   Social History  . Marital Status: Married    Spouse Name: N/A  . Number of Children: N/A  . Years of Education: N/A   Occupational History  . Not on file.   Social History Main Topics  . Smoking status: Never Smoker   . Smokeless tobacco: Not on file  . Alcohol Use: 0.0 oz/week    0 Standard drinks or equivalent per week     Comment: Social  . Drug Use: No  . Sexual Activity: Yes    Birth Control/ Protection: None   Other Topics Concern  . Not on file   Social History Narrative   Married since 2004, but separated 10/2012 from spouse. Originally from [place], in [place] since 2008. 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 Latex      Constitutional: Positive headache, fatigue and fever. Denies abrupt weight changes.  HEENT:  Positive facial pain, nasal congestion and sore throat. Denies eye redness, ear pain, ringing in the ears, wax buildup, runny nose or bloody  nose. Respiratory: Positive cough. Denies difficulty breathing or shortness of breath.  Cardiovascular: Denies chest pain, chest tightness, palpitations or swelling in the hands or feet.   No other specific complaints in a complete review of systems (except as listed in HPI above).  Objective:  BP 110/70 mmHg  Pulse 91  Temp(Src) 97.7 F (36.5 C) (Oral)  Ht 5' 3.75" (1.619 m)  Wt 180 lb (81.647 kg)  BMI 31.15 kg/m2  SpO2 98%   General: Appears her stated age, ill appearing in NAD. HEENT: Head: normal shape and size, left maxillary sinus tenderness noted; Eyes: sclera white, no icterus, conjunctiva pink; Ears: Tm's gray and intact, normal light reflex; Nose: mucosa boggy and moist, septum midline; Throat/Mouth: + PND. Teeth present, mucosa erythematous and moist, no exudate noted, no lesions or ulcerations noted.  Neck:  Anterior cervical adenopathy noted.  Cardiovascular: Normal rate and rhythm. S1,S2 noted.  No murmur, rubs or gallops noted.  Pulmonary/Chest: Normal effort and positive vesicular breath sounds. No respiratory distress. No wheezes, rales or ronchi noted.      Assessment & Plan:   Acute bacterial sinusitis  Can use a Neti Pot which can be purchased from your local drug store. Continue Claritin and Nasonex Augmentin BID for 10 days  RTC as needed or if symptoms persist.

## 2015-02-01 NOTE — Progress Notes (Signed)
Pre visit review using our clinic review tool, if applicable. No additional management support is needed unless otherwise documented below in the visit note. 

## 2015-03-04 ENCOUNTER — Encounter: Payer: Self-pay | Admitting: Internal Medicine

## 2015-03-04 ENCOUNTER — Ambulatory Visit (INDEPENDENT_AMBULATORY_CARE_PROVIDER_SITE_OTHER): Payer: BC Managed Care – PPO | Admitting: Internal Medicine

## 2015-03-04 VITALS — BP 120/86 | HR 84 | Temp 97.5°F | Resp 16 | Ht 63.75 in | Wt 179.0 lb

## 2015-03-04 DIAGNOSIS — M8580 Other specified disorders of bone density and structure, unspecified site: Secondary | ICD-10-CM | POA: Insufficient documentation

## 2015-03-04 DIAGNOSIS — N281 Cyst of kidney, acquired: Secondary | ICD-10-CM | POA: Insufficient documentation

## 2015-03-04 DIAGNOSIS — G4700 Insomnia, unspecified: Secondary | ICD-10-CM | POA: Diagnosis not present

## 2015-03-04 DIAGNOSIS — B353 Tinea pedis: Secondary | ICD-10-CM | POA: Diagnosis not present

## 2015-03-04 DIAGNOSIS — B351 Tinea unguium: Secondary | ICD-10-CM | POA: Diagnosis not present

## 2015-03-04 MED ORDER — CLOTRIMAZOLE 1 % EX CREA: TOPICAL_CREAM | CUTANEOUS | Status: DC

## 2015-03-04 MED ORDER — EFINACONAZOLE 10 % EX SOLN: CUTANEOUS | Status: DC

## 2015-03-04 NOTE — Assessment & Plan Note (Signed)
Has used otc lamisil and it has helped, but keeps coming back Start lotrimin 1%cream - use twice daily for one week and then weekly for 4 weeks Understands need to treat both skin and nails

## 2015-03-04 NOTE — Assessment & Plan Note (Signed)
Related to some anxiety as well, but has chronic insomnia Other sleep meds have not been effective or gave her side effects Did not tolerate ativan Using 1/2 xanax only as needed -  2-3 times a month Will continue above Discussed possible long term side effects of medication if used daily - she will continue to use infrequently

## 2015-03-04 NOTE — Progress Notes (Signed)
Subjective:    Patient ID: Maria Frank, female    DOB: [birth year], 40 y.o.   MRN: [id number]  HPI She is here to establish with a new pcp.  She is here for routine follow up.    Athletes foot and toenail fungus:  She has had it for a couple of years.  It is on both feet.  She has been using over the counter medication. Her toe nails are thick and her toes are dry and itchy.    Environmental allergies:  She follows with allergy.  She gets allergy injections.  She denies food allergies.  She takes claritin daily.    Anxiety/Chronic insomnia:  Her whole life she has had insomnia.  She was taking ativan and it gave her nightmares.  She has been taking 1/2 of the xanax at night.  ambien gave her night terrors.  She does yoga regularly.  She has tried a number of sleep pills in the past and they made things worse.  She does not take the medication daily - typically takes it 2-3 times a month.    Medications and allergies reviewed with patient and updated if appropriate.  Patient Active Problem List   Diagnosis Date Noted  . Encounter for routine gynecological examination 11/08/2013  . IUD (intrauterine device) in place 12/01/2012  . Mild mitral regurgitation 07/03/2010  . HYPERLIPIDEMIA 03/30/2010  . Mild depression 03/30/2010  . ALLERGIC RHINITIS 03/30/2010  . ARTHRITIS 03/30/2010  . HEADACHE 03/30/2010    Current Outpatient Prescriptions on File Prior to Visit  Medication Sig Dispense Refill  . Calcium-Magnesium-Vitamin D (CITRACAL CALCIUM+D) 600-40-500 MG-MG-UNIT TB24 Take 1 tablet by mouth 2 (two) times daily.    Marland Kitchen EPIPEN 2-PAK 0.3 MG/0.3ML SOAJ injection Inject 0.3 mLs (0.3 mg total) into the skin once as needed. 1 Device   . loratadine (CLARITIN) 10 MG tablet Take 1 tablet (10 mg total) by mouth daily. 30 tablet 11  . mometasone (NASONEX) 50 MCG/ACT nasal spray 2 sprays by Nasal route daily.      Marland Kitchen LORazepam (ATIVAN) 0.5 MG tablet Take 0.5-1 tablets (0.25-0.5 mg total) by mouth  2 (two) times daily as needed for anxiety or sleep. (Patient not taking: Reported on 03/04/2015) 30 tablet 1   No current facility-administered medications on file prior to visit.    Past Medical History  Diagnosis Date  . ARTHRITIS   . CHICKENPOX, HX OF   . Migraine   . IBS (irritable bowel syndrome)   . ALLERGIC RHINITIS   . HYPERLIPIDEMIA   . DEPRESSION     Past Surgical History  Procedure Laterality Date  . No past surgeries      Social History   Social History  . Marital Status: Married    Spouse Name: N/A  . Number of Children: N/A  . Years of Education: N/A   Social History Main Topics  . Smoking status: Never Smoker   . Smokeless tobacco: None  . Alcohol Use: 0.0 oz/week    0 Standard drinks or equivalent per week     Comment: Social  . Drug Use: No  . Sexual Activity: Yes    Birth Control/ Protection: None   Other Topics Concern  . None   Social History Narrative   Married since 2004, but separated 10/2012 from spouse. Originally from [place], in [place] since 2008. 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 Hypertension Father   .  Hypertension Paternal Grandfather   . Arthritis Other     parent & grandparents  . Breast cancer Maternal Aunt 60  . Cancer Mother     pancreatic, liver and renal   . Breast cancer Cousin 13  . Osteoporosis Mother   . Cardiomyopathy Father     Review of Systems  Constitutional: Negative for fever.  HENT: Positive for postnasal drip (allergy related).   Respiratory: Positive for cough (allergy related). Negative for shortness of breath and wheezing.   Cardiovascular: Negative for chest pain, palpitations and leg swelling.  Neurological: Positive for headaches (cluster headaches). Negative for dizziness and light-headedness.       Objective:   Filed Vitals:   03/04/15 0902  BP: 120/86  Pulse: 84  Temp: 97.5 F (36.4 C)  Resp: 16   Filed Weights   03/04/15 0902    Weight: 179 lb (81.194 kg)   Body mass index is 30.98 kg/(m^2).   Physical Exam Constitutional: Appears well-developed and well-nourished. No distress.  Neck: Neck supple. No tracheal deviation present. No thyromegaly present.  No carotid bruit. No cervical adenopathy.   Cardiovascular: Normal rate, regular rhythm and normal heart sounds.   No murmur heard.  No edema Pulmonary/Chest: Effort normal and breath sounds normal. No respiratory distress. No wheezes.  Ext: toenails - thickened, discolored nails; toe skin looks normal      Assessment & Plan:   See Problem List for Assessment and Plan of chronic medical problems.

## 2015-03-04 NOTE — Assessment & Plan Note (Signed)
Ciclopirox has not been effective Will try Jublia Will refer to podiatry given painful toenails

## 2015-03-04 NOTE — Progress Notes (Signed)
Pre visit review using our clinic review tool, if applicable. No additional management support is needed unless otherwise documented below in the visit note. 

## 2015-03-04 NOTE — Patient Instructions (Signed)
   All other Health Maintenance issues reviewed.   All recommended immunizations and age-appropriate screenings are up-to-date.  No immunizations administered today.   Medications reviewed and updated.  Changes include two topical medications for your foot and toenail fungus.   Your prescription(s) have been submitted to your pharmacy. Please take as directed and contact our office if you believe you are having problem(s) with the medication(s).  A referral was ordered for podiatry.   Please followup annually

## 2015-03-06 ENCOUNTER — Telehealth: Payer: Self-pay

## 2015-03-06 NOTE — Telephone Encounter (Signed)
PA initiated via Cover My Meds 

## 2015-03-06 NOTE — Telephone Encounter (Signed)
Maria Frank

## 2015-03-06 NOTE — Telephone Encounter (Signed)
She has tried ciclopirox and it was not effective.

## 2015-03-06 NOTE — Telephone Encounter (Signed)
PA received for Jublia, pt must first try Ciclopirox solution, Fluconazole tab, or Terbinafine HCI tab. Please advise on alternative medication, thanks

## 2015-03-07 NOTE — Telephone Encounter (Signed)
PA approved, pharmacy will be notified via CoverMyMeds

## 2015-07-01 ENCOUNTER — Encounter: Payer: Self-pay | Admitting: Internal Medicine

## 2015-07-01 ENCOUNTER — Ambulatory Visit (INDEPENDENT_AMBULATORY_CARE_PROVIDER_SITE_OTHER): Payer: BC Managed Care – PPO | Admitting: Internal Medicine

## 2015-07-01 VITALS — BP 132/90 | HR 89 | Temp 99.2°F | Resp 16 | Wt 180.0 lb

## 2015-07-01 DIAGNOSIS — M549 Dorsalgia, unspecified: Secondary | ICD-10-CM | POA: Insufficient documentation

## 2015-07-01 DIAGNOSIS — M546 Pain in thoracic spine: Secondary | ICD-10-CM

## 2015-07-01 NOTE — Progress Notes (Signed)
Subjective:    Patient ID: Maria Frank, female    DOB: 1976-01-17, 40 y.o.   MRN: [id number]  HPI She is here for an acute visit for back pain.  The pain is located in the left scapular region and it started 2 weeks ago. She noticed it when she first woke up. The pain radiates to her upper back, left shoulder and left side of her neck. Her neck is stiff and it is hard for her to look towards the left. 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 She is able to sleep the night with the medication, but she has not seen any improvement in her pain. She denies any weakness in the left arm. Sometimes at the end of the day she will experience some numbness in the arm. She denies any pain in arm. She has had some headaches. She has not taken any ibuprofen because she is concerned about side effects of her stomach-she does have a history of peptic ulcer disease.    Medications and allergies reviewed with patient and updated if appropriate.  Patient Active Problem List   Diagnosis Date Noted  . Onychomycosis 03/04/2015  . Athlete's foot 03/04/2015  . Renal cyst 03/04/2015  . Osteopenia 03/04/2015  . Insomnia 03/04/2015  . IUD (intrauterine device) in place 12/01/2012  . Mild mitral regurgitation 07/03/2010  . HYPERLIPIDEMIA 03/30/2010  . Mild depression 03/30/2010  . ALLERGIC RHINITIS 03/30/2010  . ARTHRITIS 03/30/2010  . HEADACHE 03/30/2010    Current Outpatient Prescriptions on File Prior to Visit  Medication Sig Dispense Refill  . ALPRAZolam (XANAX) 0.5 MG tablet Take 0.25 mg by mouth at bedtime as needed for anxiety.     . Calcium-Magnesium-Vitamin D (CITRACAL CALCIUM+D) 600-40-500 MG-MG-UNIT TB24 Take 1 tablet by mouth 2 (two) times daily.    . clotrimazole (LOTRIMIN AF) 1 % cream Apply twice daily for one week and then daily for 4 weeks 40 g 1  . Efinaconazole 10 % SOLN Apply daily for 48 weeks 8 mL 2  .  EPIPEN 2-PAK 0.3 MG/0.3ML SOAJ injection Inject 0.3 mLs (0.3 mg total) into the skin once as needed. 1 Device   . loratadine (CLARITIN) 10 MG tablet Take 1 tablet (10 mg total) by mouth daily. 30 tablet 11  . mometasone (NASONEX) 50 MCG/ACT nasal spray 2 sprays by Nasal route daily.       No current facility-administered medications on file prior to visit.    Past Medical History  Diagnosis Date  . ARTHRITIS   . CHICKENPOX, HX OF   . Migraine   . IBS (irritable bowel syndrome)   . ALLERGIC RHINITIS   . HYPERLIPIDEMIA   . DEPRESSION     Past Surgical History  Procedure Laterality Date  . No past surgeries      Social History   Social History  . Marital Status: Married    Spouse Name: N/A  . Number of Children: N/A  . Years of Education: N/A   Social History Main Topics  . Smoking status: Never Smoker   . Smokeless tobacco: None  . Alcohol Use: 0.0 oz/week    0 Standard drinks or equivalent per week     Comment: Social  . Drug Use: No  . Sexual Activity: Yes    Birth Control/ Protection: None   Other Topics Concern  . None   Social History Narrative  Married since 2004, but separated 10/2012 from spouse. Originally from [place], in [place] since 2008. 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 Hypertension Father   . Cardiomyopathy Father   . Sleep apnea Father   . Hypertension Paternal Grandfather   . Arthritis Other     parent & grandparents  . Breast cancer Maternal Aunt 60  . Cancer Mother     pancreatic, liver and renal   . Osteoporosis Mother   . Breast cancer Cousin 30  . Asthma Sister   . Sleep apnea Brother   . Asthma Sister   . Sleep apnea Brother     Review of Systems  Musculoskeletal: Positive for back pain (Upper back), neck pain and neck stiffness.  Neurological: Positive for numbness and headaches. Negative for dizziness, weakness and light-headedness.       Objective:   Filed Vitals:    07/01/15 1609  BP: 132/90  Pulse: 89  Temp: 99.2 F (37.3 C)  Resp: 16   Filed Weights   07/01/15 1609  Weight: 180 lb (81.647 kg)   Body mass index is 31.15 kg/(m^2).   Physical Exam  Constitutional: She appears well-developed and well-nourished. No distress.  Musculoskeletal: She exhibits no edema.  Muscular tightness and tenderness with palpation left scapular region, left upper back and left-sided. Slightly decreased range of motion of the neck when looking toward the left. No tenderness with palpation left arm  Neurological: No cranial nerve deficit. She exhibits normal muscle tone.  Normal sensation and strength bilateral upper extremities  Skin: She is not diaphoretic.          Assessment & Plan:   See Problem List for Assessment and Plan of chronic medical problems.

## 2015-07-01 NOTE — Assessment & Plan Note (Signed)
Muscular in nature No improvement with increased dose of Flexeril to 10 mg from 5 mg nightly, which she was taking for TMJ  Apply heat Can consider ibuprofen, but she would rather avoid this if possible Discussed other options-we will refer for physical therapy Call or Return if no improvement

## 2015-07-01 NOTE — Progress Notes (Signed)
Pre visit review using our clinic review tool, if applicable. No additional management support is needed unless otherwise documented below in the visit note. 

## 2015-07-01 NOTE — Patient Instructions (Signed)
   Medications reviewed and updated.  No changes recommended at this time.   A referral was ordered for physical therapy  Please followup if your symptoms do not improve.

## 2015-08-28 ENCOUNTER — Ambulatory Visit: Payer: BC Managed Care – PPO | Attending: Internal Medicine

## 2015-08-28 DIAGNOSIS — M542 Cervicalgia: Secondary | ICD-10-CM | POA: Insufficient documentation

## 2015-08-28 DIAGNOSIS — R293 Abnormal posture: Secondary | ICD-10-CM | POA: Diagnosis present

## 2015-08-28 DIAGNOSIS — M546 Pain in thoracic spine: Secondary | ICD-10-CM | POA: Insufficient documentation

## 2015-08-28 DIAGNOSIS — R252 Cramp and spasm: Secondary | ICD-10-CM | POA: Diagnosis present

## 2015-08-28 NOTE — Patient Instructions (Signed)
From cabinet issued and instructed on good sitting posture and levator and trap stretching for home every 2-3 hours 1-3 stretches to RT 15-20 sec and scapula retraction and depression

## 2015-08-28 NOTE — Therapy (Signed)
[place], [place], [address] Phone: [phone]   Fax:  [phone]  Physical Therapy Evaluation  Patient Details  Name: Maria Frank MRN: [id number] Date of Birth: 1975/02/03 Referring Provider: Billey Gosling, MD  Encounter Date: 08/28/2015      PT End of Session - 08/28/15 1548    Visit Number 1   Number of Visits 12   Date for PT Re-Evaluation 10/09/15   Authorization Type BCBS   PT Start Time 0345   PT Stop Time 0430   PT Time Calculation (min) 45 min   Activity Tolerance Patient tolerated treatment well   Behavior During Therapy [facility] for tasks assessed/performed      Past Medical History:  Diagnosis Date  . ALLERGIC RHINITIS   . ARTHRITIS   . CHICKENPOX, HX OF   . DEPRESSION   . HYPERLIPIDEMIA   . IBS (irritable bowel syndrome)   . Migraine     Past Surgical History:  Procedure Laterality Date  . NO PAST SURGERIES      There were no vitals filed for this visit.       Subjective Assessment - 08/28/15 1555    Subjective The pain started without inury when she woke and could not moe LT arm and as day progressed wh began to have pain to LT posterior upper arm over triceps.   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 20 min   How long can you stand comfortably? As needed  30 min   How long can you walk comfortably? As needed   Diagnostic tests None   Currently in Pain? Yes   Pain Score 3    Pain Location Thoracic  neck and scapula upper back   Pain Orientation Left   Pain Descriptors / Indicators Dull;Constant   Pain Type Chronic pain   Pain Radiating Towards less now in LT triceps   Pain Onset 1 to 4 weeks ago   Pain Frequency Constant   Aggravating Factors  using LT arm for tasks   Pain  Relieving Factors rest , medication   Multiple Pain Sites No            [facility] PT Assessment - 08/28/15 1549      Assessment   Medical Diagnosis Lt upper back pain   Referring Provider Billey Gosling, MD   Onset Date/Surgical Date --  6 weeks ago   Next MD Visit As needed   Prior Therapy Sort of , with TMJ and large breasts so has seen chiropractor.      Precautions   Precautions None     Restrictions   Weight Bearing Restrictions No     Balance Screen   Has the patient fallen in the past 6 months No   Has the patient had a decrease in activity level because of a fear of falling?  No   Is the patient reluctant to leave their home because of a fear of falling?  No     Prior Function   Level of Independence Independent     Cognition   Overall Cognitive Status Within Functional Limits for tasks assessed     Posture/Postural Control   Posture Comments Sitting rounded shoulders and forward head, increased thoracic kyphosis  ROM / Strength   AROM / PROM / Strength AROM;Strength     AROM   AROM Assessment Site Thoracic;Cervical   Cervical Flexion 62   Cervical Extension 45   Cervical - Right Side Bend 35   Cervical - Left Side Bend 42  pain LT neck   Cervical - Right Rotation 73   Cervical - Left Rotation 73   Thoracic Flexion 25   Thoracic Extension 32   Thoracic - Right Side Bend 23   Thoracic - Left Side Bend 24   Thoracic - Right Rotation 46   Thoracic - Left Rotation 35     Strength   Overall Strength Comments WNL     Palpation   Palpation comment Tender Lt cervical and thoracic paraspinals and along LT traps into LT deltoids and levator     Ambulation/Gait   Gait Comments WNL                           PT Education - 08/28/15 1643    Education provided Yes   Education Details POC spasm and posture and HEP for same   Person(s) Educated Patient   Methods Explanation;Demonstration;Tactile cues;Verbal cues;Handout   Comprehension  Returned demonstration;Verbalized understanding          PT Short Term Goals - 08/28/15 1638      PT SHORT TERM GOAL #1   Title She will be independent with inital HEP   Time 3   Period Weeks   Status New     PT SHORT TERM GOAL #2   Title She will report pain as intermittnat   Time 3   Period Weeks   Status New     PT SHORT TERM GOAL #3   Title She will demo understanding of good siting posture   Time 3   Period Weeks   Status New           PT Long Term Goals - 08/28/15 1639      PT LONG TERM GOAL #1   Title She will be independent with all HEP issued   Time 6   Period Weeks   Status New     PT LONG TERM GOAL #2   Title She will report pain 75% improve and no need for meds to help with sleep   Time 6   Period Weeks   Status New     PT LONG TERM GOAL #3   Title She will report able to use computer with LT arm with 1-2 max pain   Time 6   Period Weeks   Status New     PT LONG TERM GOAL #4   Title She will report able to sew with 1-2 max pain    Time 6   Period Weeks   Status New     PT LONG TERM GOAL #5   Title She will report no pain or tenderness in LT tricep   Time 6   Period Weeks   Status New               Plan - 08/28/15 1549    Clinical Impression Statement Ms Trottier presents today for a moderate complexity evaluation with pain LT neck and upper back and scapula, incr thoracic kyphosis,  decr neck and thoracic ROM and spasm limiting her ability to perform normal activity without pain.  She should improve with PT   Rehab Potential Good   PT  Frequency 2x / week   PT Duration 6 weeks   PT Treatment/Interventions Electrical Stimulation;Iontophoresis 4mg /ml Dexamethasone;Moist Heat;Ultrasound;Patient/family education;Therapeutic exercise;Manual techniques;Passive range of motion;Taping;Dry needling   PT Next Visit Plan Modalities for pain and manual   PT Home Exercise Plan Posture and stretching LT side of neck   Consulted and Agree with  Plan of Care Patient      Patient will benefit from skilled therapeutic intervention in order to improve the following deficits and impairments:  Pain, Increased muscle spasms, Decreased activity tolerance, Postural dysfunction, Decreased range of motion  Visit Diagnosis: Left-sided thoracic back pain - Plan: PT plan of care cert/re-cert  Abnormal posture - Plan: PT plan of care cert/re-cert  Cramp and spasm - Plan: PT plan of care cert/re-cert  Cervicalgia - Plan: PT plan of care cert/re-cert     Problem List Patient Active Problem List   Diagnosis Date Noted  . Upper back pain on left side 07/01/2015  . Onychomycosis 03/04/2015  . Athlete's foot 03/04/2015  . Renal cyst 03/04/2015  . Osteopenia 03/04/2015  . Insomnia 03/04/2015  . IUD (intrauterine device) in place 12/01/2012  . Mild mitral regurgitation 07/03/2010  . HYPERLIPIDEMIA 03/30/2010  . Mild depression 03/30/2010  . ALLERGIC RHINITIS 03/30/2010  . ARTHRITIS 03/30/2010  . HEADACHE 03/30/2010    Darrel Hoover  PT 08/28/2015, 5:48 PM  [facility] [address] [place], [place], [address] Phone: [phone]   Fax:  [phone]  Name: Bhavana Mcmanamy MRN: [id number] Date of Birth: 1975-06-16

## 2015-09-04 ENCOUNTER — Ambulatory Visit: Payer: BC Managed Care – PPO | Attending: Internal Medicine | Admitting: Physical Therapy

## 2015-09-04 DIAGNOSIS — M546 Pain in thoracic spine: Secondary | ICD-10-CM | POA: Insufficient documentation

## 2015-09-04 DIAGNOSIS — R293 Abnormal posture: Secondary | ICD-10-CM | POA: Diagnosis present

## 2015-09-04 DIAGNOSIS — M542 Cervicalgia: Secondary | ICD-10-CM | POA: Insufficient documentation

## 2015-09-04 DIAGNOSIS — R252 Cramp and spasm: Secondary | ICD-10-CM | POA: Insufficient documentation

## 2015-09-04 NOTE — Therapy (Signed)
[place], [place], [address] Phone: [phone]   Fax:  [phone]  Physical Therapy Treatment  Patient Details  Name: Maria Frank MRN: [id number] Date of Birth: [birth year] Referring Provider: Billey Gosling, MD  Encounter Date: 09/04/2015      PT End of Session - 09/04/15 0937    Visit Number 2   Number of Visits 12   Date for PT Re-Evaluation 10/09/15   Authorization Type BCBS   PT Start Time 0935   PT Stop Time 1030   PT Time Calculation (min) 55 min      Past Medical History:  Diagnosis Date  . ALLERGIC RHINITIS   . ARTHRITIS   . CHICKENPOX, HX OF   . DEPRESSION   . HYPERLIPIDEMIA   . IBS (irritable bowel syndrome)   . Migraine     Past Surgical History:  Procedure Laterality Date  . NO PAST SURGERIES      There were no vitals filed for this visit.      Subjective Assessment - 09/04/15 0936    Currently in Pain? Yes   Pain Score 2    Pain Location Neck  and posterior shoulder   Pain Orientation Left   Pain Descriptors / Indicators Dull;Constant   Aggravating Factors  using LT arm, prolonged positions   Pain Relieving Factors rest, medication                         [facility] Adult PT Treatment/Exercise - 09/04/15 0001      Shoulder Exercises: Seated   Retraction 10 reps     Modalities   Modalities Moist Heat     Moist Heat Therapy   Number Minutes Moist Heat 15 Minutes   Moist Heat Location Cervical     Manual Therapy   Manual Therapy Soft tissue mobilization   Soft tissue mobilization Left upper trap, levator, rhomboids, paraspinals using massage chair     Neck Exercises: Stretches   Upper Trapezius Stretch 3 reps;30 seconds   Levator Stretch 3 reps;30 seconds                PT Education - 09/04/15 1243    Education provided Yes   Education Details upper trap stretch   Person(s) Educated Patient   Methods Explanation   Comprehension  Verbalized understanding          PT Short Term Goals - 08/28/15 1638      PT SHORT TERM GOAL #1   Title She will be independent with inital HEP   Time 3   Period Weeks   Status New     PT SHORT TERM GOAL #2   Title She will report pain as intermittnat   Time 3   Period Weeks   Status New     PT SHORT TERM GOAL #3   Title She will demo understanding of good siting posture   Time 3   Period Weeks   Status New           PT Long Term Goals - 08/28/15 1639      PT LONG TERM GOAL #1   Title She will be independent with all HEP issued   Time 6   Period Weeks   Status New     PT LONG TERM GOAL #2   Title She will report pain 75% improve and no need for meds to help with sleep   Time 6  Period Weeks   Status New     PT LONG TERM GOAL #3   Title She will report able to use computer with LT arm with 1-2 max pain   Time 6   Period Weeks   Status New     PT LONG TERM GOAL #4   Title She will report able to sew with 1-2 max pain    Time 6   Period Weeks   Status New     PT LONG TERM GOAL #5   Title She will report no pain or tenderness in LT tricep   Time 6   Period Weeks   Status New               Plan - 09/04/15 1244    Clinical Impression Statement Review of initial HEP. Pt is independent with HEP and reports it is helpful. We added upper trap stretch to HEP. Most time spent with soft tissue work to left traps, levator, rhomboids and parspinals. [place] post. Pt reports she is returning to swimming after 8 years.     PT Next Visit Plan Modalities for pain and manual      Patient will benefit from skilled therapeutic intervention in order to improve the following deficits and impairments:  Pain, Increased muscle spasms, Decreased activity tolerance, Postural dysfunction, Decreased range of motion  Visit Diagnosis: Left-sided thoracic back pain  Abnormal posture  Cramp and spasm  Cervicalgia     Problem List Patient Active Problem List    Diagnosis Date Noted  . Upper back pain on left side 07/01/2015  . Onychomycosis 03/04/2015  . Athlete's foot 03/04/2015  . Renal cyst 03/04/2015  . Osteopenia 03/04/2015  . Insomnia 03/04/2015  . IUD (intrauterine device) in place 12/01/2012  . Mild mitral regurgitation 07/03/2010  . HYPERLIPIDEMIA 03/30/2010  . Mild depression 03/30/2010  . ALLERGIC RHINITIS 03/30/2010  . ARTHRITIS 03/30/2010  . HEADACHE 03/30/2010    Dorene Ar, PTA 09/04/2015, 12:55 PM  [facility] [address] [place], [place], [address] Phone: [phone]   Fax:  [phone]  Name: Maria Frank MRN: [id number] Date of Birth: 04-17-1975

## 2015-09-05 ENCOUNTER — Ambulatory Visit: Payer: BC Managed Care – PPO | Admitting: Physical Therapy

## 2015-09-05 DIAGNOSIS — M546 Pain in thoracic spine: Secondary | ICD-10-CM | POA: Diagnosis not present

## 2015-09-05 DIAGNOSIS — M542 Cervicalgia: Secondary | ICD-10-CM

## 2015-09-05 DIAGNOSIS — R293 Abnormal posture: Secondary | ICD-10-CM

## 2015-09-05 DIAGNOSIS — R252 Cramp and spasm: Secondary | ICD-10-CM

## 2015-09-05 NOTE — Therapy (Signed)
Maria Frank, [place], [address] Phone: [phone]   Fax:  [phone]  Physical Therapy Treatment  Patient Details  Name: Maria Frank MRN: [id number] Date of Birth: 1975/09/30 Referring Provider: Billey Gosling, MD  Encounter Date: 09/05/2015      PT End of Session - 09/05/15 1107    Visit Number 3   Number of Visits 12   Date for PT Re-Evaluation 10/09/15   PT Start Time 0930   PT Stop Time 1020   PT Time Calculation (min) 50 min   Activity Tolerance Patient tolerated treatment well   Behavior During Therapy [facility] for tasks assessed/performed      Past Medical History:  Diagnosis Date  . ALLERGIC RHINITIS   . ARTHRITIS   . CHICKENPOX, HX OF   . DEPRESSION   . HYPERLIPIDEMIA   . IBS (irritable bowel syndrome)   . Migraine     Past Surgical History:  Procedure Laterality Date  . NO PAST SURGERIES      There were no vitals filed for this visit.      Subjective Assessment - 09/05/15 0936    Subjective " I am little sore from yesterday"    Currently in Pain? Yes   Pain Score 3    Pain Location Neck   Pain Orientation Left   Pain Type Chronic pain            [facility] PT Assessment - 09/05/15 0001      AROM   Cervical - Left Rotation 76  assessed following Dn                     [facility] Adult PT Treatment/Exercise - 09/05/15 0001      Moist Heat Therapy   Number Minutes Moist Heat 10 Minutes   Moist Heat Location Cervical     Manual Therapy   Manual Therapy Joint mobilization;Taping   Joint Mobilization T1-T7 Grade 2-3 P>A joint mobs   Soft tissue mobilization IASTM of L upper trap/ levator scapulae   McConnell trial of L upper trap inhibition taping     Neck Exercises: Stretches   Upper Trapezius Stretch 2 reps;30 seconds   Levator Stretch 2 reps;30 seconds          Trigger Point Dry Needling - 09/05/15 0938    Consent Given? Yes   Education Handout Provided  Yes   Muscles Treated Upper Body Upper trapezius   Upper Trapezius Response Twitch reponse elicited;Palpable increased muscle length  x 3 with pistoning technique on L only              PT Education - 09/05/15 1107    Education provided Yes   Education Details Dry needling education regarding benefits and what to expect during and following, benefits of inhibition taping and aftercare.    Person(s) Educated Patient   Methods Explanation;Verbal cues   Comprehension Verbalized understanding;Verbal cues required          PT Short Term Goals - 08/28/15 1638      PT SHORT TERM GOAL #1   Title She will be independent with inital HEP   Time 3   Period Weeks   Status New     PT SHORT TERM GOAL #2   Title She will report pain as intermittnat   Time 3   Period Weeks   Status New     PT SHORT TERM GOAL #3   Title She will demo  understanding of good siting posture   Time 3   Period Weeks   Status New           PT Long Term Goals - 08/28/15 1639      PT LONG TERM GOAL #1   Title She will be independent with all HEP issued   Time 6   Period Weeks   Status New     PT LONG TERM GOAL #2   Title She will report pain 75% improve and no need for meds to help with sleep   Time 6   Period Weeks   Status New     PT LONG TERM GOAL #3   Title She will report able to use computer with LT arm with 1-2 max pain   Time 6   Period Weeks   Status New     PT LONG TERM GOAL #4   Title She will report able to sew with 1-2 max pain    Time 6   Period Weeks   Status New     PT LONG TERM GOAL #5   Title She will report no pain or tenderness in LT tricep   Time 6   Period Weeks   Status New               Plan - 09/05/15 1108    Clinical Impression Statement Maria Frank reported soreness in the shoulder since last session. DN was performed on the L upper trap; pt was monitored during treatment. Following Soft tissue work and taping she reported decreased tension  improving rotation to 76 degress to the L. MHP post session for soreness.    PT Next Visit Plan assess response to DN and taping, Modalities for pain and manual   Consulted and Agree with Plan of Care Patient      Patient will benefit from skilled therapeutic intervention in order to improve the following deficits and impairments:  Pain, Increased muscle spasms, Decreased activity tolerance, Postural dysfunction, Decreased range of motion  Visit Diagnosis: Left-sided thoracic back pain  Abnormal posture  Cramp and spasm  Cervicalgia     Problem List Patient Active Problem List   Diagnosis Date Noted  . Upper back pain on left side 07/01/2015  . Onychomycosis 03/04/2015  . Athlete's foot 03/04/2015  . Renal cyst 03/04/2015  . Osteopenia 03/04/2015  . Insomnia 03/04/2015  . IUD (intrauterine device) in place 12/01/2012  . Mild mitral regurgitation 07/03/2010  . HYPERLIPIDEMIA 03/30/2010  . Mild depression 03/30/2010  . ALLERGIC RHINITIS 03/30/2010  . ARTHRITIS 03/30/2010  . HEADACHE 03/30/2010   Maria Frank PT, DPT, LAT, ATC  09/05/15  11:18 AM      [facility] [address] [place], [place], [address] Phone: [phone]   Fax:  [phone]  Name: Maria Frank MRN: [id number] Date of Birth: 01-03-1976

## 2015-09-10 ENCOUNTER — Ambulatory Visit: Payer: BC Managed Care – PPO | Admitting: Physical Therapy

## 2015-09-10 DIAGNOSIS — M546 Pain in thoracic spine: Secondary | ICD-10-CM

## 2015-09-10 DIAGNOSIS — M542 Cervicalgia: Secondary | ICD-10-CM

## 2015-09-10 DIAGNOSIS — R252 Cramp and spasm: Secondary | ICD-10-CM

## 2015-09-10 DIAGNOSIS — R293 Abnormal posture: Secondary | ICD-10-CM

## 2015-09-10 NOTE — Therapy (Signed)
[place], [place], [address] Phone: [phone]   Fax:  [phone]  Physical Therapy Treatment  Patient Details  Name: Maria Frank MRN: [id number] Date of Birth: [birth year] Referring Provider: Billey Gosling, MD  Encounter Date: 09/10/2015      PT End of Session - 09/10/15 1333    Visit Number 4   Number of Visits 12   Date for PT Re-Evaluation 10/09/15   Authorization Type BCBS   PT Start Time 0130   PT Stop Time 0220   PT Time Calculation (min) 50 min      Past Medical History:  Diagnosis Date  . ALLERGIC RHINITIS   . ARTHRITIS   . CHICKENPOX, HX OF   . DEPRESSION   . HYPERLIPIDEMIA   . IBS (irritable bowel syndrome)   . Migraine     Past Surgical History:  Procedure Laterality Date  . NO PAST SURGERIES      There were no vitals filed for this visit.      Subjective Assessment - 09/10/15 1336    Currently in Pain? Yes   Pain Score 3    Pain Location Neck   Pain Orientation Left   Aggravating Factors  not using lt arm, it gets stiff, prolonged use of mouse   Pain Relieving Factors rests , meds            [facility] PT Assessment - 09/10/15 0001      AROM   Cervical - Right Rotation 70   Cervical - Left Rotation 70                     [facility] Adult PT Treatment/Exercise - 09/10/15 0001      Neck Exercises: Supine   Neck Retraction 10 reps     Shoulder Exercises: Supine   Other Supine Exercises yellow band scap stabilization x 15 each      Shoulder Exercises: Seated   Row Left;15 reps;Theraband   Theraband Level (Shoulder Row) Level 2 (Red)     Modalities   Modalities Ultrasound     Ultrasound   Ultrasound Location bilateral upper traps   Ultrasound Parameters 15mz, 1.5 w/cm2, 100% x 8 min    Ultrasound Goals Pain     Manual Therapy   Soft tissue mobilization  Bil upper trap/ levator scapulae, emphasis on left   McConnell  L upper trap inhibition taping                 PT Education - 09/10/15 1424    Education provided Yes   Education Details Supine scap stab    Person(s) Educated Patient   Methods Explanation;Handout   Comprehension Verbalized understanding;Returned demonstration          PT Short Term Goals - 09/10/15 1337      PT SHORT TERM GOAL #1   Title She will be independent with inital HEP   Time 3   Period Weeks   Status On-going     PT SHORT TERM GOAL #2   Title She will report pain as intermittnat   Time 3   Period Weeks   Status On-going     PT SHORT TERM GOAL #3   Title She will demo understanding of good siting posture   Time 3   Period Weeks   Status Achieved           PT Long Term Goals - 08/28/15 14081  PT LONG TERM GOAL #1   Title She will be independent with all HEP issued   Time 6   Period Weeks   Status New     PT LONG TERM GOAL #2   Title She will report pain 75% improve and no need for meds to help with sleep   Time 6   Period Weeks   Status New     PT LONG TERM GOAL #3   Title She will report able to use computer with LT arm with 1-2 max pain   Time 6   Period Weeks   Status New     PT LONG TERM GOAL #4   Title She will report able to sew with 1-2 max pain    Time 6   Period Weeks   Status New     PT LONG TERM GOAL #5   Title She will report no pain or tenderness in LT tricep   Time 6   Period Weeks   Status New               Plan - 09/10/15 1426    Clinical Impression Statement pt reports improvement following TPDN and upper trap inhibition tape. She is having stressful week at work and reports pain has returned escpecially with cervical rotation. Instructed pt in supine scap stab series and updated HEP . Pt will be out of town for the next two weeks. Trial of [place] to bilateral upper traps followed by trigger point release. Repeated upper trp tape per pt request. Pt reports decreased pain with cercvical rotation following today's treatment. She  demonstrates understanding of good posture. STG#3 MET.    PT Next Visit Plan continue DN and taping, Modalities for pain and manual; review and progress scap stab   PT Home Exercise Plan Posture and stretching LT side of neck, supine scap stab yellow   Consulted and Agree with Plan of Care Patient      Patient will benefit from skilled therapeutic intervention in order to improve the following deficits and impairments:  Pain, Increased muscle spasms, Decreased activity tolerance, Postural dysfunction, Decreased range of motion  Visit Diagnosis: Left-sided thoracic back pain  Abnormal posture  Cramp and spasm  Cervicalgia     Problem List Patient Active Problem List   Diagnosis Date Noted  . Upper back pain on left side 07/01/2015  . Onychomycosis 03/04/2015  . Athlete's foot 03/04/2015  . Renal cyst 03/04/2015  . Osteopenia 03/04/2015  . Insomnia 03/04/2015  . IUD (intrauterine device) in place 12/01/2012  . Mild mitral regurgitation 07/03/2010  . HYPERLIPIDEMIA 03/30/2010  . Mild depression 03/30/2010  . ALLERGIC RHINITIS 03/30/2010  . ARTHRITIS 03/30/2010  . HEADACHE 03/30/2010    Dorene Ar, PTA 09/10/2015, 2:31 PM  [facility] [address] [place], [place], [address] Phone: [phone]   Fax:  [phone]  Name: Maria Frank MRN: [id number] Date of Birth: [birth year]

## 2015-09-10 NOTE — Patient Instructions (Signed)
Over Head Pull: Narrow Grip       On back, knees bent, feet flat, band across thighs, elbows straight but relaxed. Pull hands apart (start). Keeping elbows straight, bring arms up and over head, hands toward floor. Keep pull steady on band. Hold momentarily. Return slowly, keeping pull steady, back to start. Repeat _10-20__ times. Band color ___Y___   Side Pull: Double Arm   On back, knees bent, feet flat. Arms perpendicular to body, shoulder level, elbows straight but relaxed. Pull arms out to sides, elbows straight. Resistance band comes across collarbones, hands toward floor. Hold momentarily. Slowly return to starting position. Repeat 10-20___ times. Band color ___Y__   Sash   On back, knees bent, feet flat, left hand on left hip, right hand above left. Pull right arm DIAGONALLY (hip to shoulder) across chest. Bring right arm along head toward floor. Hold momentarily. Slowly return to starting position. Repeat 10-20___ times. Do with left arm. Band color __Y____   Shoulder Rotation: Double Arm   On back, knees bent, feet flat, elbows tucked at sides, bent 90, hands palms up. Pull hands apart and down toward floor, keeping elbows near sides. Hold momentarily. Slowly return to starting position. Repeat _10-20__ times. Band color ___Y___

## 2015-09-29 ENCOUNTER — Ambulatory Visit: Payer: BC Managed Care – PPO | Admitting: Physical Therapy

## 2015-09-29 DIAGNOSIS — M546 Pain in thoracic spine: Secondary | ICD-10-CM

## 2015-09-29 DIAGNOSIS — R252 Cramp and spasm: Secondary | ICD-10-CM

## 2015-09-29 DIAGNOSIS — R293 Abnormal posture: Secondary | ICD-10-CM

## 2015-09-29 DIAGNOSIS — M542 Cervicalgia: Secondary | ICD-10-CM

## 2015-09-29 NOTE — Therapy (Signed)
[place], [place], [address] Phone: [phone]   Fax:  [phone]  Physical Therapy Treatment  Patient Details  Name: Maria Frank MRN: [id number] Date of Birth: 06-Aug-1975 Referring Provider: Billey Gosling, MD  Encounter Date: 09/29/2015      PT End of Session - 09/29/15 1423    Visit Number 5   Number of Visits 12   Date for PT Re-Evaluation 10/09/15   Authorization Type BCBS   PT Start Time 1415   PT Stop Time 1505   PT Time Calculation (min) 50 min   Activity Tolerance Patient tolerated treatment well   Behavior During Therapy [facility] for tasks assessed/performed      Past Medical History:  Diagnosis Date  . ALLERGIC RHINITIS   . ARTHRITIS   . CHICKENPOX, HX OF   . DEPRESSION   . HYPERLIPIDEMIA   . IBS (irritable bowel syndrome)   . Migraine     Past Surgical History:  Procedure Laterality Date  . NO PAST SURGERIES      There were no vitals filed for this visit.      Subjective Assessment - 09/29/15 1423    Subjective "I am doing pretty good, just got back from vacation"   Currently in Pain? Yes   Pain Score 1    Pain Location Neck   Pain Orientation Left   Pain Descriptors / Indicators Aching   Pain Onset More than a month ago   Pain Frequency Intermittent   Aggravating Factors  staying up in a position for a long period of time.   Pain Relieving Factors rest, meds,                          [facility] Adult PT Treatment/Exercise - 09/29/15 0001      Shoulder Exercises: Seated   Other Seated Exercises seated thoracic mobs over back of chair 2 x 10     Manual Therapy   Joint Mobilization Grade 3 T1-T8 central mobs and  R unilaterals (to facilitate L rotation)    Soft tissue mobilization IASTM along L mid thoracic paraspinals     Neck Exercises: Stretches   Other Neck Stretches rhomboid stretch 2 x 30 sec hold                PT Education - 09/29/15  1506    Education provided Yes   Education Details updated HEP, mechancis of the thoracic spine and benefits of improving mobility.    Person(s) Educated Patient   Methods Explanation;Demonstration;Verbal cues;Handout   Comprehension Verbalized understanding;Verbal cues required          PT Short Term Goals - 09/10/15 1337      PT SHORT TERM GOAL #1   Title She will be independent with inital HEP   Time 3   Period Weeks   Status On-going     PT SHORT TERM GOAL #2   Title She will report pain as intermittnat   Time 3   Period Weeks   Status On-going     PT SHORT TERM GOAL #3   Title She will demo understanding of good siting posture   Time 3   Period Weeks   Status Achieved           PT Long Term Goals - 08/28/15 1639      PT LONG TERM GOAL #1   Title She will be independent with all HEP issued  Time 6   Period Weeks   Status New     PT LONG TERM GOAL #2   Title She will report pain 75% improve and no need for meds to help with sleep   Time 6   Period Weeks   Status New     PT LONG TERM GOAL #3   Title She will report able to use computer with LT arm with 1-2 max pain   Time 6   Period Weeks   Status New     PT LONG TERM GOAL #4   Title She will report able to sew with 1-2 max pain    Time 6   Period Weeks   Status New     PT LONG TERM GOAL #5   Title She will report no pain or tenderness in LT tricep   Time 6   Period Weeks   Status New               Plan - 09/29/15 1731    Clinical Impression Statement Mrs. Bourbon reports she is doing better but continues to have interrmittent pain in the L shoulder and upper thoracic region. Pt continues to demo increased thoracic kyphosis with pain at the L thoracic parapspinals at T7. DN was perform following by soft tissue work to calm down soreness. following thoracic mobs and stretching she reported decreased pain.    PT Next Visit Plan assess DN,  thoracic mobility, scapular stability, thoracic  mobs central/ R unilaterals, goals, ROM   Consulted and Agree with Plan of Care Patient      Patient will benefit from skilled therapeutic intervention in order to improve the following deficits and impairments:  Pain, Increased muscle spasms, Decreased activity tolerance, Postural dysfunction, Decreased range of motion  Visit Diagnosis: Left-sided thoracic back pain  Abnormal posture  Cramp and spasm  Cervicalgia     Problem List Patient Active Problem List   Diagnosis Date Noted  . Upper back pain on left side 07/01/2015  . Onychomycosis 03/04/2015  . Athlete's foot 03/04/2015  . Renal cyst 03/04/2015  . Osteopenia 03/04/2015  . Insomnia 03/04/2015  . IUD (intrauterine device) in place 12/01/2012  . Mild mitral regurgitation 07/03/2010  . HYPERLIPIDEMIA 03/30/2010  . Mild depression 03/30/2010  . ALLERGIC RHINITIS 03/30/2010  . ARTHRITIS 03/30/2010  . HEADACHE 03/30/2010   Starr Lake PT, DPT, LAT, ATC  09/29/15  5:46 PM      [facility] [address] [place], [place], [address] Phone: [phone]   Fax:  [phone]  Name: Maria Frank MRN: [id number] Date of Birth: 1976-01-06

## 2015-10-01 ENCOUNTER — Ambulatory Visit: Payer: BC Managed Care – PPO | Admitting: Physical Therapy

## 2015-10-01 DIAGNOSIS — R252 Cramp and spasm: Secondary | ICD-10-CM

## 2015-10-01 DIAGNOSIS — M546 Pain in thoracic spine: Secondary | ICD-10-CM

## 2015-10-01 DIAGNOSIS — R293 Abnormal posture: Secondary | ICD-10-CM

## 2015-10-01 DIAGNOSIS — M542 Cervicalgia: Secondary | ICD-10-CM

## 2015-10-01 NOTE — Therapy (Signed)
[place], [place], [address] Phone: [phone]   Fax:  [phone]  Physical Therapy Treatment  Patient Details  Name: Maria Frank MRN: [id number] Date of Birth: 10-Jan-1976 Referring Provider: Billey Gosling, MD  Encounter Date: 10/01/2015      PT End of Session - 10/01/15 1506    Visit Number 6   Number of Visits 12   Date for PT Re-Evaluation 10/09/15   PT Start Time [id number]   PT Stop Time 1459   PT Time Calculation (min) 43 min   Activity Tolerance Patient tolerated treatment well   Behavior During Therapy [facility] for tasks assessed/performed      Past Medical History:  Diagnosis Date  . ALLERGIC RHINITIS   . ARTHRITIS   . CHICKENPOX, HX OF   . DEPRESSION   . HYPERLIPIDEMIA   . IBS (irritable bowel syndrome)   . Migraine     Past Surgical History:  Procedure Laterality Date  . NO PAST SURGERIES      There were no vitals filed for this visit.      Subjective Assessment - 10/01/15 1423    Subjective "I am back to work at my desk and I am back to having 3/10 pain.    Currently in Pain? Yes   Pain Score 3    Pain Location Neck   Pain Orientation Left   Pain Descriptors / Indicators Aching   Pain Onset More than a month ago   Pain Frequency Intermittent                         [facility] Adult PT Treatment/Exercise - 10/01/15 1428      Shoulder Exercises: Seated   Other Seated Exercises seated pelvic tilting 2 x 10 to promote poprer sitting posture   Other Seated Exercises seated thoracic mobs over back of chair 2 x 10     Manual Therapy   Manual Therapy Taping   Joint Mobilization Grade 3 T1-T8 central mobs and  R unilaterals (to facilitate L rotation)    Soft tissue mobilization IASTM along L mid thoracic paraspinals   Kinesiotex Inhibit Muscle     Kinesiotix   Inhibit Muscle  L parapsinals                 PT Education - 10/01/15 1505    Education provided  Yes   Education Details KT taping benefits and after care for lumbar spine. posture for the low back especially with sitting and effects with prolong sitting especially with slouching causing tightness in the paraspinals.    Person(s) Educated Patient   Methods Explanation;Verbal cues;Handout   Comprehension Verbalized understanding;Verbal cues required          PT Short Term Goals - 09/10/15 1337      PT SHORT TERM GOAL #1   Title She will be independent with inital HEP   Time 3   Period Weeks   Status On-going     PT SHORT TERM GOAL #2   Title She will report pain as intermittnat   Time 3   Period Weeks   Status On-going     PT SHORT TERM GOAL #3   Title She will demo understanding of good siting posture   Time 3   Period Weeks   Status Achieved           PT Long Term Goals - 08/28/15 [id number]  PT LONG TERM GOAL #1   Title She will be independent with all HEP issued   Time 6   Period Weeks   Status New     PT LONG TERM GOAL #2   Title She will report pain 75% improve and no need for meds to help with sleep   Time 6   Period Weeks   Status New     PT LONG TERM GOAL #3   Title She will report able to use computer with LT arm with 1-2 max pain   Time 6   Period Weeks   Status New     PT LONG TERM GOAL #4   Title She will report able to sew with 1-2 max pain    Time 6   Period Weeks   Status New     PT LONG TERM GOAL #5   Title She will report no pain or tenderness in LT tricep   Time 6   Period Weeks   Status New               Plan - 10/01/15 1507    Clinical Impression Statement mrs. Gailey reports increased soreness today rated at 3/10. Focused on manual to calm down tightness and mobs to improve thoracic mobility, attempted trial of KT taping to inhibit parapsinals and provide feedback for proper posture. pt declined modalities post session.    PT Next Visit Plan assess KT taping,  thoracic mobility, scapular stability, thoracic mobs  central/ R unilaterals, goals, ROM   Consulted and Agree with Plan of Care Patient      Patient will benefit from skilled therapeutic intervention in order to improve the following deficits and impairments:  Pain, Increased muscle spasms, Decreased activity tolerance, Postural dysfunction, Decreased range of motion  Visit Diagnosis: Left-sided thoracic back pain  Abnormal posture  Cramp and spasm  Cervicalgia     Problem List Patient Active Problem List   Diagnosis Date Noted  . Upper back pain on left side 07/01/2015  . Onychomycosis 03/04/2015  . Athlete's foot 03/04/2015  . Renal cyst 03/04/2015  . Osteopenia 03/04/2015  . Insomnia 03/04/2015  . IUD (intrauterine device) in place 12/01/2012  . Mild mitral regurgitation 07/03/2010  . HYPERLIPIDEMIA 03/30/2010  . Mild depression 03/30/2010  . ALLERGIC RHINITIS 03/30/2010  . ARTHRITIS 03/30/2010  . HEADACHE 03/30/2010   Starr Lake PT, DPT, LAT, ATC  10/01/15  3:09 PM      [facility] [address] [place], [place], [address] Phone: [phone]   Fax:  [phone]  Name: Zanita Redbird MRN: [id number] Date of Birth: February 11, 1975

## 2015-10-08 ENCOUNTER — Ambulatory Visit: Payer: BC Managed Care – PPO | Admitting: Physical Therapy

## 2015-10-13 ENCOUNTER — Ambulatory Visit: Payer: BC Managed Care – PPO | Attending: Internal Medicine | Admitting: Physical Therapy

## 2015-10-13 DIAGNOSIS — M542 Cervicalgia: Secondary | ICD-10-CM

## 2015-10-13 DIAGNOSIS — R252 Cramp and spasm: Secondary | ICD-10-CM | POA: Insufficient documentation

## 2015-10-13 DIAGNOSIS — M546 Pain in thoracic spine: Secondary | ICD-10-CM | POA: Insufficient documentation

## 2015-10-13 DIAGNOSIS — R293 Abnormal posture: Secondary | ICD-10-CM | POA: Diagnosis present

## 2015-10-13 NOTE — Therapy (Signed)
[place], [place], [address] Phone: [phone]   Fax:  [phone]  Physical Therapy Treatment / Re-certification Note  Patient Details  Name: Maria Frank MRN: [id number] Date of Birth: 04-Mar-1975 Referring Provider: Billey Gosling, MD  Encounter Date: 10/13/2015      PT End of Session - 10/13/15 1434    Visit Number 7   Number of Visits 12   Date for PT Re-Evaluation 11/17/15   Authorization Type BCBS   PT Start Time 1417   PT Stop Time 1507   PT Time Calculation (min) 50 min   Activity Tolerance Patient tolerated treatment well   Behavior During Therapy [facility] for tasks assessed/performed      Past Medical History:  Diagnosis Date  . ALLERGIC RHINITIS   . ARTHRITIS   . CHICKENPOX, HX OF   . DEPRESSION   . HYPERLIPIDEMIA   . IBS (irritable bowel syndrome)   . Migraine     Past Surgical History:  Procedure Laterality Date  . NO PAST SURGERIES      There were no vitals filed for this visit.      Subjective Assessment - 10/13/15 1421    Subjective "Things have been going pretty good"    Currently in Pain? Yes   Pain Score 2    Pain Location Neck   Pain Orientation Left   Pain Descriptors / Indicators Aching   Pain Onset More than a month ago   Pain Frequency Intermittent   Aggravating Factors  staying still for too long both standing or sitting    Pain Relieving Factors movement, meds PRN,             [facility] PT Assessment - 10/13/15 1426      AROM   Cervical Flexion 64   Cervical Extension 48   Cervical - Right Side Bend 36  ERP   Cervical - Left Side Bend 42  ERP   Cervical - Right Rotation 76   Cervical - Left Rotation 76                     [facility] Adult PT Treatment/Exercise - 10/13/15 0001      Shoulder Exercises: Sidelying   Other Sidelying Exercises book opening 2 x 10     Manual Therapy   Joint Mobilization Grade 3 T1-T8 central mobs and  R  unilaterals (to facilitate L rotation)    Soft tissue mobilization IASTM along L mid thoracic paraspinals          Trigger Point Dry Needling - 10/13/15 1503    Consent Given? Yes   Education Handout Provided Yes   Muscles Treated Upper Body Longissimus   Upper Trapezius Response --   Longissimus Response Twitch response elicited;Palpable increased muscle length  T8-T7 with pistoning/ twsisting              PT Education - 10/13/15 1508    Education provided Yes   Education Details reviewed and updated HEP, updated POC   Person(s) Educated Patient   Methods Explanation;Verbal cues;Handout   Comprehension Verbalized understanding;Verbal cues required          PT Short Term Goals - 10/13/15 1432      PT SHORT TERM GOAL #1   Title She will be independent with inital HEP   Time 3   Period Weeks   Status Achieved     PT SHORT TERM GOAL #2   Title She will  report pain as intermittnat   Time 3   Period Weeks   Status Achieved     PT SHORT TERM GOAL #3   Title She will demo understanding of good siting posture   Time 3   Period Weeks   Status Achieved           PT Long Term Goals - 10/13/15 1433      PT LONG TERM GOAL #1   Title She will be independent with all HEP issued   Time 6   Period Weeks   Status On-going     PT LONG TERM GOAL #2   Title She will report pain 75% improve and no need for meds to help with sleep   Time 6   Period Weeks   Status Partially Met     PT LONG TERM GOAL #3   Title She will report able to use computer with LT arm with 1-2 max pain   Time 6   Period Weeks   Status Achieved     PT LONG TERM GOAL #4   Title She will report able to sew with 1-2 max pain    Time 6   Period Weeks   Status Unable to assess     PT LONG TERM GOAL #5   Title She will report no pain or tenderness in LT tricep   Time 6   Period Weeks   Status On-going               Plan - 10/13/15 1508    Clinical Impression Statement Mrs.  Darcey reports decreased pain since the last session and demonstates improvement in cervical mobility. She is met all STGs and is progressing well ing LTGs. performed DN of the L multifidus at T7-T8, following manual and thoracic mobs she reported decreased pain and tightness. continued work on throacic mobility which was given as HEP. plan to continue for 1 x a week  to continue to work on [employer] and independent exercise.    Rehab Potential Good   PT Frequency 1x / week   PT Duration Other (comment)  5 weeks   PT Treatment/Interventions Electrical Stimulation;Iontophoresis 7m/ml Dexamethasone;Moist Heat;Ultrasound;Patient/family education;Therapeutic exercise;Manual techniques;Passive range of motion;Taping;Dry needling   PT Next Visit Plan assess KT taping,  thoracic mobility, scapular stability, thoracic mobs central/ R unilaterals, goals,    PT Home Exercise Plan book opening   Consulted and Agree with Plan of Care Patient      Patient will benefit from skilled therapeutic intervention in order to improve the following deficits and impairments:  Pain, Increased muscle spasms, Decreased activity tolerance, Postural dysfunction, Decreased range of motion  Visit Diagnosis: Left-sided thoracic back pain - Plan: PT plan of care cert/re-cert  Abnormal posture - Plan: PT plan of care cert/re-cert  Cramp and spasm - Plan: PT plan of care cert/re-cert  Cervicalgia - Plan: PT plan of care cert/re-cert     Problem List Patient Active Problem List   Diagnosis Date Noted  . Upper back pain on left side 07/01/2015  . Onychomycosis 03/04/2015  . Athlete's foot 03/04/2015  . Renal cyst 03/04/2015  . Osteopenia 03/04/2015  . Insomnia 03/04/2015  . IUD (intrauterine device) in place 12/01/2012  . Mild mitral regurgitation 07/03/2010  . HYPERLIPIDEMIA 03/30/2010  . Mild depression 03/30/2010  . ALLERGIC RHINITIS 03/30/2010  . ARTHRITIS 03/30/2010  . HEADACHE 03/30/2010    KStarr LakePT, DPT, LAT, ATC  10/13/15  3:54 PM  [place], [place], [address] Phone: [phone]   Fax:  [phone]  Name: Blondie Riggsbee MRN: [id number] Date of Birth: [birth year]

## 2015-10-15 ENCOUNTER — Ambulatory Visit: Payer: BC Managed Care – PPO | Admitting: Physical Therapy

## 2015-10-20 ENCOUNTER — Ambulatory Visit: Payer: BC Managed Care – PPO | Admitting: Physical Therapy

## 2015-10-20 DIAGNOSIS — M546 Pain in thoracic spine: Secondary | ICD-10-CM

## 2015-10-20 DIAGNOSIS — R252 Cramp and spasm: Secondary | ICD-10-CM

## 2015-10-20 DIAGNOSIS — R293 Abnormal posture: Secondary | ICD-10-CM

## 2015-10-20 DIAGNOSIS — M542 Cervicalgia: Secondary | ICD-10-CM

## 2015-10-20 NOTE — Therapy (Signed)
[place], [place], [address] Phone: [phone]   Fax:  [phone]  Physical Therapy Treatment  Patient Details  Name: Maria Frank MRN: [id number] Date of Birth: [birth year] Referring Provider: Billey Gosling, MD  Encounter Date: 10/20/2015      PT End of Session - 10/20/15 1546    Visit Number 8   Number of Visits 12   Date for PT Re-Evaluation 11/17/15   PT Start Time 1504   PT Stop Time 1554   PT Time Calculation (min) 50 min   Activity Tolerance Patient tolerated treatment well   Behavior During Therapy [facility] for tasks assessed/performed      Past Medical History:  Diagnosis Date  . ALLERGIC RHINITIS   . ARTHRITIS   . CHICKENPOX, HX OF   . DEPRESSION   . HYPERLIPIDEMIA   . IBS (irritable bowel syndrome)   . Migraine     Past Surgical History:  Procedure Laterality Date  . NO PAST SURGERIES      There were no vitals filed for this visit.      Subjective Assessment - 10/20/15 1512    Subjective "the back is doing fine, I woke up this AM with a little crick in the neck"    Currently in Pain? Yes   Pain Score 1    Pain Location Neck   Pain Orientation Left   Pain Descriptors / Indicators Aching   Pain Type Chronic pain   Pain Onset More than a month ago   Pain Frequency Intermittent   Aggravating Factors  sitting/ standing for too long in one postion   Pain Relieving Factors movement, meds as needed                         [facility] Adult PT Treatment/Exercise - 10/20/15 1514      Shoulder Exercises: Supine   Other Supine Exercises foam roll routine lying supine on foam roll, ceiling punches, alterning ceiling punches, horizontal abduction/ adduction, back stroke,    Other Supine Exercises thoracic extension over foam roll with hands behind head 2 x 10      Shoulder Exercises: ROM/Strengthening   UBE (Upper Arm Bike) L1 x 6 min  changing direction at 3 min     Neck Exercises: Stretches   Upper Trapezius Stretch 2 reps;30 seconds   Other Neck Stretches rhomboid stretch 2 x 30 sec hold                PT Education - 10/20/15 1545    Education provided Yes   Education Details manual trigger point release using tools such as the theracane and where to find the tools, to relieve muscle tightness.    Person(s) Educated Patient   Methods Explanation;Verbal cues;Handout   Comprehension Verbalized understanding;Verbal cues required          PT Short Term Goals - 10/13/15 1432      PT SHORT TERM GOAL #1   Title She will be independent with inital HEP   Time 3   Period Weeks   Status Achieved     PT SHORT TERM GOAL #2   Title She will report pain as intermittnat   Time 3   Period Weeks   Status Achieved     PT SHORT TERM GOAL #3   Title She will demo understanding of good siting posture   Time 3   Period Weeks   Status Achieved  PT Long Term Goals - 10/13/15 1433      PT LONG TERM GOAL #1   Title She will be independent with all HEP issued   Time 6   Period Weeks   Status On-going     PT LONG TERM GOAL #2   Title She will report pain 75% improve and no need for meds to help with sleep   Time 6   Period Weeks   Status Partially Met     PT LONG TERM GOAL #3   Title She will report able to use computer with LT arm with 1-2 max pain   Time 6   Period Weeks   Status Achieved     PT LONG TERM GOAL #4   Title She will report able to sew with 1-2 max pain    Time 6   Period Weeks   Status Unable to assess     PT LONG TERM GOAL #5   Title She will report no pain or tenderness in LT tricep   Time 6   Period Weeks   Status On-going               Plan - 10/20/15 1546    Clinical Impression Statement Mrs. Olarte states she is doing better with report of pain at 1/10. focused on thoracic mobility using foam roll, and meducated about manual trigger pont release techniques. pt reported improved  mobility but soreness remained the same.    PT Next Visit Plan thoracic mobility, scapular stability, thoracic mobs central/ R unilaterals, goals,    Consulted and Agree with Plan of Care Patient      Patient will benefit from skilled therapeutic intervention in order to improve the following deficits and impairments:  Pain, Increased muscle spasms, Decreased activity tolerance, Postural dysfunction, Decreased range of motion  Visit Diagnosis: Left-sided thoracic back pain  Abnormal posture  Cramp and spasm  Cervicalgia     Problem List Patient Active Problem List   Diagnosis Date Noted  . Upper back pain on left side 07/01/2015  . Onychomycosis 03/04/2015  . Athlete's foot 03/04/2015  . Renal cyst 03/04/2015  . Osteopenia 03/04/2015  . Insomnia 03/04/2015  . IUD (intrauterine device) in place 12/01/2012  . Mild mitral regurgitation 07/03/2010  . HYPERLIPIDEMIA 03/30/2010  . Mild depression 03/30/2010  . ALLERGIC RHINITIS 03/30/2010  . ARTHRITIS 03/30/2010  . HEADACHE 03/30/2010   Starr Lake PT, DPT, LAT, ATC  10/20/15  3:48 PM      [facility] [address] [place], [place], [address] Phone: [phone]   Fax:  [phone]  Name: Maria Frank MRN: [id number] Date of Birth: April 18, 1975

## 2015-11-04 ENCOUNTER — Ambulatory Visit: Payer: BC Managed Care – PPO | Attending: Internal Medicine | Admitting: Physical Therapy

## 2015-11-04 DIAGNOSIS — G8929 Other chronic pain: Secondary | ICD-10-CM

## 2015-11-04 DIAGNOSIS — R293 Abnormal posture: Secondary | ICD-10-CM

## 2015-11-04 DIAGNOSIS — M542 Cervicalgia: Secondary | ICD-10-CM | POA: Diagnosis present

## 2015-11-04 DIAGNOSIS — R252 Cramp and spasm: Secondary | ICD-10-CM

## 2015-11-04 DIAGNOSIS — M546 Pain in thoracic spine: Secondary | ICD-10-CM | POA: Insufficient documentation

## 2015-11-04 NOTE — Therapy (Signed)
[place], [place], [address] Phone: [phone]   Fax:  [phone]  Physical Therapy Treatment  Patient Details  Name: Maria Frank MRN: [id number] Date of Birth: 1975/05/07 Referring Provider: Billey Gosling, MD  Encounter Date: 11/04/2015      PT End of Session - 11/04/15 1112    Visit Number 9   Number of Visits 12   Date for PT Re-Evaluation 11/17/15   PT Start Time 1219   PT Stop Time 0931   PT Time Calculation (min) 44 min   Activity Tolerance Patient tolerated treatment well   Behavior During Therapy [facility] for tasks assessed/performed      Past Medical History:  Diagnosis Date  . ALLERGIC RHINITIS   . ARTHRITIS   . CHICKENPOX, HX OF   . DEPRESSION   . HYPERLIPIDEMIA   . IBS (irritable bowel syndrome)   . Migraine     Past Surgical History:  Procedure Laterality Date  . NO PAST SURGERIES      There were no vitals filed for this visit.      Subjective Assessment - 11/04/15 0853    Subjective "I did a 14 hour hike on a 1/2 dome and am alittle sore in the legs and some in the back"   Currently in Pain? No/denies                         [facility] Adult PT Treatment/Exercise - 11/04/15 0001      Shoulder Exercises: Seated   Other Seated Exercises thoracic rotation 1 x 15 hugging physioballl     Shoulder Exercises: Standing   Other Standing Exercises standing scapular stabilizer routine 1 x 15 each, narrow girp abduction with flexion, wide grip abduction with flexion, horizontal abduction, PNF D2 bil, Money exercise,   Other Standing Exercises lower trap 2 x 10  1 x with red theraband, 1x with yellow theraband     Shoulder Exercises: ROM/Strengthening   UBE (Upper Arm Bike) L2 x 10 min     Manual Therapy   Joint Mobilization Grade 3 T1-T8 central mobs and  R unilaterals (to facilitate L rotation)   while pt perofrmed thoracic extension hugging physioball                 PT Education - 11/04/15 1111    Education provided Yes   Education Details lower trap activation 2 x10 with proper form   Person(s) Educated Patient   Methods Explanation   Comprehension Verbalized understanding          PT Short Term Goals - 10/13/15 1432      PT SHORT TERM GOAL #1   Title She will be independent with inital HEP   Time 3   Period Weeks   Status Achieved     PT SHORT TERM GOAL #2   Title She will report pain as intermittnat   Time 3   Period Weeks   Status Achieved     PT SHORT TERM GOAL #3   Title She will demo understanding of good siting posture   Time 3   Period Weeks   Status Achieved           PT Long Term Goals - 10/13/15 1433      PT LONG TERM GOAL #1   Title She will be independent with all HEP issued   Time 6   Period Weeks   Status On-going  PT LONG TERM GOAL #2   Title She will report pain 75% improve and no need for meds to help with sleep   Time 6   Period Weeks   Status Partially Met     PT LONG TERM GOAL #3   Title She will report able to use computer with LT arm with 1-2 max pain   Time 6   Period Weeks   Status Achieved     PT LONG TERM GOAL #4   Title She will report able to sew with 1-2 max pain    Time 6   Period Weeks   Status Unable to assess     PT LONG TERM GOAL #5   Title She will report no pain or tenderness in LT tricep   Time 6   Period Weeks   Status On-going               Plan - 11/04/15 1117    Clinical Impression Statement [name] reports doing a long hike and reported she is doing well. focused on thoracic mobility and scapular stability exercises. she reported no pain during and following todays session and declined modalities post session.    PT Next Visit Plan thoracic mobility, scapular stability, thoracic mobs central/ R unilaterals, goals, I's, T's Y's,    Consulted and Agree with Plan of Care Patient      Patient will benefit from skilled  therapeutic intervention in order to improve the following deficits and impairments:     Visit Diagnosis: Chronic left-sided thoracic back pain  Abnormal posture  Cramp and spasm  Cervicalgia     Problem List Patient Active Problem List   Diagnosis Date Noted  . Upper back pain on left side 07/01/2015  . Onychomycosis 03/04/2015  . Athlete's foot 03/04/2015  . Renal cyst 03/04/2015  . Osteopenia 03/04/2015  . Insomnia 03/04/2015  . IUD (intrauterine device) in place 12/01/2012  . Mild mitral regurgitation 07/03/2010  . HYPERLIPIDEMIA 03/30/2010  . Mild depression ([place]) 03/30/2010  . ALLERGIC RHINITIS 03/30/2010  . ARTHRITIS 03/30/2010  . HEADACHE 03/30/2010   Starr Lake PT, DPT, LAT, ATC  11/04/15  11:26 AM      [facility] [address] [place], [place], [address] Phone: [phone]   Fax:  [phone]  Name: Maria Frank MRN: [id number] Date of Birth: [birth year]

## 2015-11-11 ENCOUNTER — Ambulatory Visit: Payer: BC Managed Care – PPO | Admitting: Physical Therapy

## 2015-11-12 ENCOUNTER — Encounter: Payer: BC Managed Care – PPO | Admitting: Gynecology

## 2015-11-18 ENCOUNTER — Ambulatory Visit: Payer: BC Managed Care – PPO | Admitting: Physical Therapy

## 2015-11-18 DIAGNOSIS — M546 Pain in thoracic spine: Principal | ICD-10-CM

## 2015-11-18 DIAGNOSIS — R252 Cramp and spasm: Secondary | ICD-10-CM

## 2015-11-18 DIAGNOSIS — R293 Abnormal posture: Secondary | ICD-10-CM

## 2015-11-18 DIAGNOSIS — M542 Cervicalgia: Secondary | ICD-10-CM

## 2015-11-18 DIAGNOSIS — G8929 Other chronic pain: Secondary | ICD-10-CM

## 2015-11-18 NOTE — Therapy (Signed)
[place], [place], [address] Phone: [phone]   Fax:  [phone]  Physical Therapy Treatment / Discharge note  Patient Details  Name: Maria Frank MRN: [id number] Date of Birth: 07-04-1975 Referring Provider: Billey Gosling, MD  Encounter Date: 11/18/2015      PT End of Session - 11/18/15 0926    Visit Number 10   Number of Visits 12   Date for PT Re-Evaluation 11/17/15   Authorization Type BCBS   PT Start Time 2046   PT Stop Time 2124   PT Time Calculation (min) 38 min   Activity Tolerance Patient tolerated treatment well   Behavior During Therapy [facility] for tasks assessed/performed      Past Medical History:  Diagnosis Date  . ALLERGIC RHINITIS   . ARTHRITIS   . CHICKENPOX, HX OF   . DEPRESSION   . HYPERLIPIDEMIA   . IBS (irritable bowel syndrome)   . Migraine     Past Surgical History:  Procedure Laterality Date  . NO PAST SURGERIES      There were no vitals filed for this visit.      Subjective Assessment - 11/18/15 0849    Subjective "I've been doing my exercises, but overall doing better"    Currently in Pain? No/denies   Pain Score 0-No pain            [facility] PT Assessment - 11/18/15 0001      AROM   Cervical Flexion 64   Cervical Extension 56   Cervical - Right Side Bend 48   Cervical - Left Side Bend 48   Cervical - Right Rotation 73   Cervical - Left Rotation 71                     [facility] Adult PT Treatment/Exercise - 11/18/15 0001      Shoulder Exercises: Prone   Other Prone Exercises lower trap strengthening   Other Prone Exercises I's T's Y's 2 x 10, performed bil   1 set with 1#, 1 set with 2#     Shoulder Exercises: ROM/Strengthening   UBE (Upper Arm Bike) L2 x 5 min  changing direction at 2:w0 sec                PT Education - 11/18/15 0924    Education provided Yes   Education Details updated and reviewed HEP, discussed  prgression of exercises to work on endurance.    Person(s) Educated Patient   Methods Explanation;Verbal cues;Handout   Comprehension Verbalized understanding;Verbal cues required          PT Short Term Goals - 10/13/15 1432      PT SHORT TERM GOAL #1   Title She will be independent with inital HEP   Time 3   Period Weeks   Status Achieved     PT SHORT TERM GOAL #2   Title She will report pain as intermittnat   Time 3   Period Weeks   Status Achieved     PT SHORT TERM GOAL #3   Title She will demo understanding of good siting posture   Time 3   Period Weeks   Status Achieved           PT Long Term Goals - 11/18/15 9702      PT LONG TERM GOAL #1   Title She will be independent with all HEP issued   Time 6   Period Weeks  Status Achieved     PT LONG TERM GOAL #2   Title She will report pain 75% improve and no need for meds to help with sleep   Baseline 100% improvement (no pain)   Time 6   Period Weeks   Status Achieved     PT LONG TERM GOAL #3   Title She will report able to use computer with LT arm with 1-2 max pain   Time 6   Period Weeks   Status Achieved     PT LONG TERM GOAL #4   Title She will report able to sew with 1-2 max pain    Time 6   Period Weeks   Status Achieved     PT LONG TERM GOAL #5   Title She will report no pain or tenderness in LT tricep   Time 6   Period Weeks   Status Achieved               Plan - 11/18/15 0926    Clinical Impression Statement Mrs. Rottenberg stated she has had no pain and was dong well. she was able to do all exercises given with no report of pain. She met all goals today and report she is able to maintain and progress her current level of function independelty and will be discharged from PT today.    PT Next Visit Plan discharge   PT Home Exercise Plan book opening, I's, T's, and Y's,    Consulted and Agree with Plan of Care Patient      Patient will benefit from skilled therapeutic intervention  in order to improve the following deficits and impairments:  Pain, Increased muscle spasms, Decreased activity tolerance, Postural dysfunction, Decreased range of motion  Visit Diagnosis: Chronic left-sided thoracic back pain  Abnormal posture  Cervicalgia  Cramp and spasm     Problem List Patient Active Problem List   Diagnosis Date Noted  . Upper back pain on left side 07/01/2015  . Onychomycosis 03/04/2015  . Athlete's foot 03/04/2015  . Renal cyst 03/04/2015  . Osteopenia 03/04/2015  . Insomnia 03/04/2015  . IUD (intrauterine device) in place 12/01/2012  . Mild mitral regurgitation 07/03/2010  . HYPERLIPIDEMIA 03/30/2010  . Mild depression ([place]) 03/30/2010  . ALLERGIC RHINITIS 03/30/2010  . ARTHRITIS 03/30/2010  . HEADACHE 03/30/2010   Starr Lake PT, DPT, LAT, ATC  11/18/15  9:30 AM      [facility] [address] [place], [place], [address] Phone: [phone]   Fax:  [phone]  Name: Maria Frank MRN: [id number] Date of Birth: 1976/01/21   PHYSICAL THERAPY DISCHARGE SUMMARY  Visits from Start of Care: 10  Current functional level related to goals / functional outcomes: See goals   Remaining deficits: She reported no pain in the back or limitations   Education / Equipment: HEP, theraband, posture, lifting mechanics,  Plan: Patient agrees to discharge.  Patient goals were met. Patient is being discharged due to meeting the stated rehab goals.  ?????

## 2015-11-18 NOTE — Addendum Note (Signed)
Addended by: Larey Days on: 11/18/2015 10:44 AM   Modules accepted: Orders

## 2015-11-20 ENCOUNTER — Ambulatory Visit (INDEPENDENT_AMBULATORY_CARE_PROVIDER_SITE_OTHER): Payer: BC Managed Care – PPO | Admitting: Gynecology

## 2015-11-20 ENCOUNTER — Encounter: Payer: Self-pay | Admitting: Gynecology

## 2015-11-20 VITALS — Ht 63.25 in | Wt 176.0 lb

## 2015-11-20 DIAGNOSIS — Z01419 Encounter for gynecological examination (general) (routine) without abnormal findings: Secondary | ICD-10-CM | POA: Diagnosis not present

## 2015-11-20 NOTE — Progress Notes (Signed)
Maria Frank [birth year] [id number]   History:    40 y.o.  for annual gyn exam with no complaints today. Patient has a [place] IUD reports normal menstrual cycles. Patient with no previous history of any abnormal Pap smears. Her PCP has been doing her blood work and she will be getting her flu vaccine the next week. 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 She's currently taking calcium 1200 mg daily and vitamin D 2000 units daily. Patient had a fragility fracture at the age of 67 and her right foot.  Patient has informed me that her mother also had osteoporosis but she also had been diagnosed with renal cancer at the age of 61 and later had metastatic pancreatic cancer 35 and subsequently liver metastases and is now deceased. Patient stated she's been followed by Dr. Tresa Moore urologist as a result of a left renal mass that they have been monitoring on a yearly basis she scheduled for follow-up this November.  Past medical history,surgical history, family history and social history were all reviewed and documented in the EPIC chart.  Gynecologic History Patient's last menstrual period was 11/11/2015. Contraception: IUD Last Pap: 2014. Results were: normal Last mammogram: 2016. Results were: Normal but dense  Obstetric History OB History  Gravida Para Term Preterm AB Living  0 0 0 0 0 0  SAB TAB Ectopic Multiple Live Births  0 0 0 0           ROS: A ROS was performed and pertinent positives and negatives are included in the history.  GENERAL: No fevers or chills. HEENT: No change in vision, no earache, sore throat or sinus congestion. NECK: No pain or stiffness. CARDIOVASCULAR: No chest pain or pressure. No palpitations. PULMONARY: No shortness of breath, cough or wheeze.  GASTROINTESTINAL: No abdominal pain, nausea, vomiting or diarrhea, melena or bright red blood per rectum. GENITOURINARY: No urinary frequency, urgency, hesitancy or dysuria. MUSCULOSKELETAL: No joint or muscle pain, no back pain, no recent trauma. DERMATOLOGIC: No rash, no itching, no lesions. ENDOCRINE: No polyuria, polydipsia, no heat or cold intolerance. No recent change in weight. HEMATOLOGICAL: No anemia or easy bruising or bleeding. NEUROLOGIC: No headache, seizures, numbness, tingling or weakness. PSYCHIATRIC: No depression, no loss of interest in normal activity or change in sleep pattern.     Exam: chaperone present  Ht 5' 3.25" (1.607 m)   Wt 176 lb (79.8 kg)   LMP 11/11/2015 Comment: paragard   BMI 30.93 kg/m   Body mass index is 30.93 kg/m.  General appearance : Well developed well nourished female. No acute distress HEENT: Eyes: no retinal hemorrhage or exudates,  Neck supple, trachea midline, no carotid bruits, no thyroidmegaly Lungs: Clear to auscultation, no rhonchi or wheezes, or rib retractions  Heart: Regular rate and rhythm, no murmurs or gallops Breast:Examined in sitting and supine position were symmetrical in appearance, no palpable masses or tenderness,  no skin retraction, no nipple inversion, no nipple discharge, no skin discoloration, no axillary or supraclavicular lymphadenopathy Abdomen: no palpable masses or tenderness, no rebound or guarding Extremities: no edema or skin discoloration or tenderness  Pelvic:  Bartholin, Urethra, Skene Glands: Within normal limits             Vagina: No gross lesions or discharge  Cervix:  No gross lesions or discharge, IUD string visualized  Uterus  anteverted, normal size, shape and consistency, non-tender and mobile  Adnexa  Without masses or tenderness  Anus and perineum  normal   Rectovaginal  normal sphincter tone without palpated masses or tenderness             Hemoccult not indicated     Assessment/Plan:  40  y.o. female for annual exam was reminded to schedule her three-dimensional mammogram which is due November. We discussed importance of monthly breast exams. We discussed importance of calcium vitamin D and weightbearing exercises for osteoporosis prevention. Her PCP we'll be doing her blood work as well as her flu vaccine. Pap smear was done today.   Terrance Mass MD, 8:34 AM 11/20/2015

## 2015-11-24 LAB — PAP IG W/ RFLX HPV ASCU

## 2015-11-26 ENCOUNTER — Other Ambulatory Visit: Payer: Self-pay | Admitting: Gynecology

## 2015-11-26 DIAGNOSIS — Z1231 Encounter for screening mammogram for malignant neoplasm of breast: Secondary | ICD-10-CM

## 2015-12-19 ENCOUNTER — Ambulatory Visit
Admission: RE | Admit: 2015-12-19 | Discharge: 2015-12-19 | Disposition: A | Discharge status: Home / Self Care | Payer: BC Managed Care – PPO | Source: Ambulatory Visit | Attending: Gynecology | Admitting: Gynecology

## 2015-12-19 DIAGNOSIS — Z1231 Encounter for screening mammogram for malignant neoplasm of breast: Secondary | ICD-10-CM

## 2016-02-10 ENCOUNTER — Other Ambulatory Visit: Payer: Self-pay | Admitting: Urology

## 2016-02-10 DIAGNOSIS — N281 Cyst of kidney, acquired: Secondary | ICD-10-CM

## 2016-02-21 ENCOUNTER — Ambulatory Visit
Admission: RE | Admit: 2016-02-21 | Discharge: 2016-02-21 | Disposition: A | Discharge status: Home / Self Care | Payer: BC Managed Care – PPO | Source: Ambulatory Visit | Attending: Urology | Admitting: Urology

## 2016-02-21 DIAGNOSIS — N281 Cyst of kidney, acquired: Secondary | ICD-10-CM

## 2016-02-21 IMAGING — MG MM SCREEN MAMMOGRAM BILATERAL
4 series · 4 of 4 positions shown · non-contrast
Comparison: None.

CLINICAL DATA: Screening.

EXAM:
DIGITAL SCREENING BILATERAL MAMMOGRAM WITH CAD

[R CC]
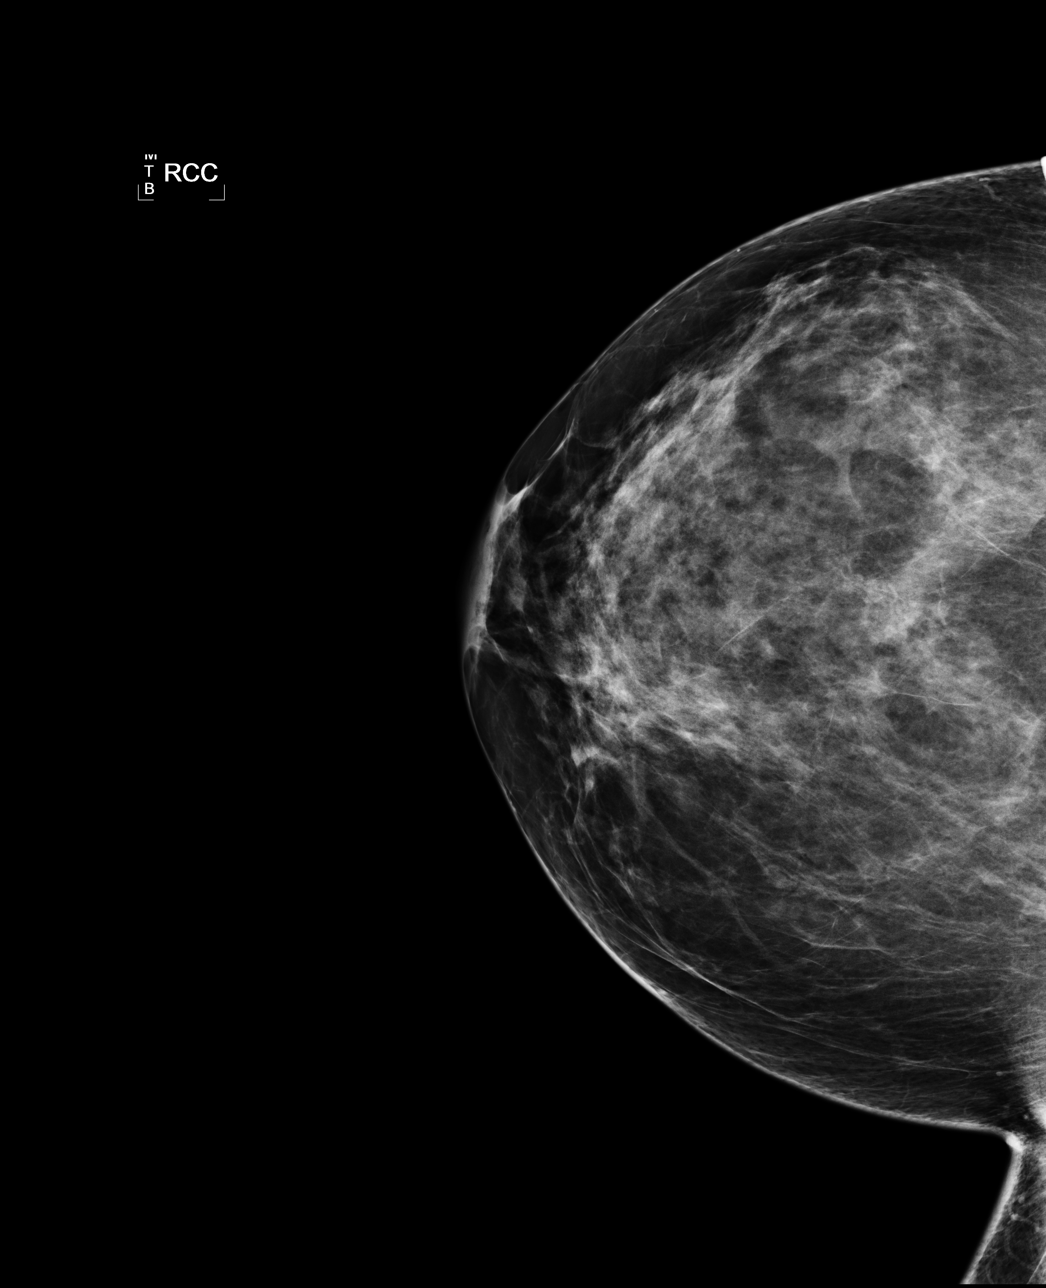

[L CC]
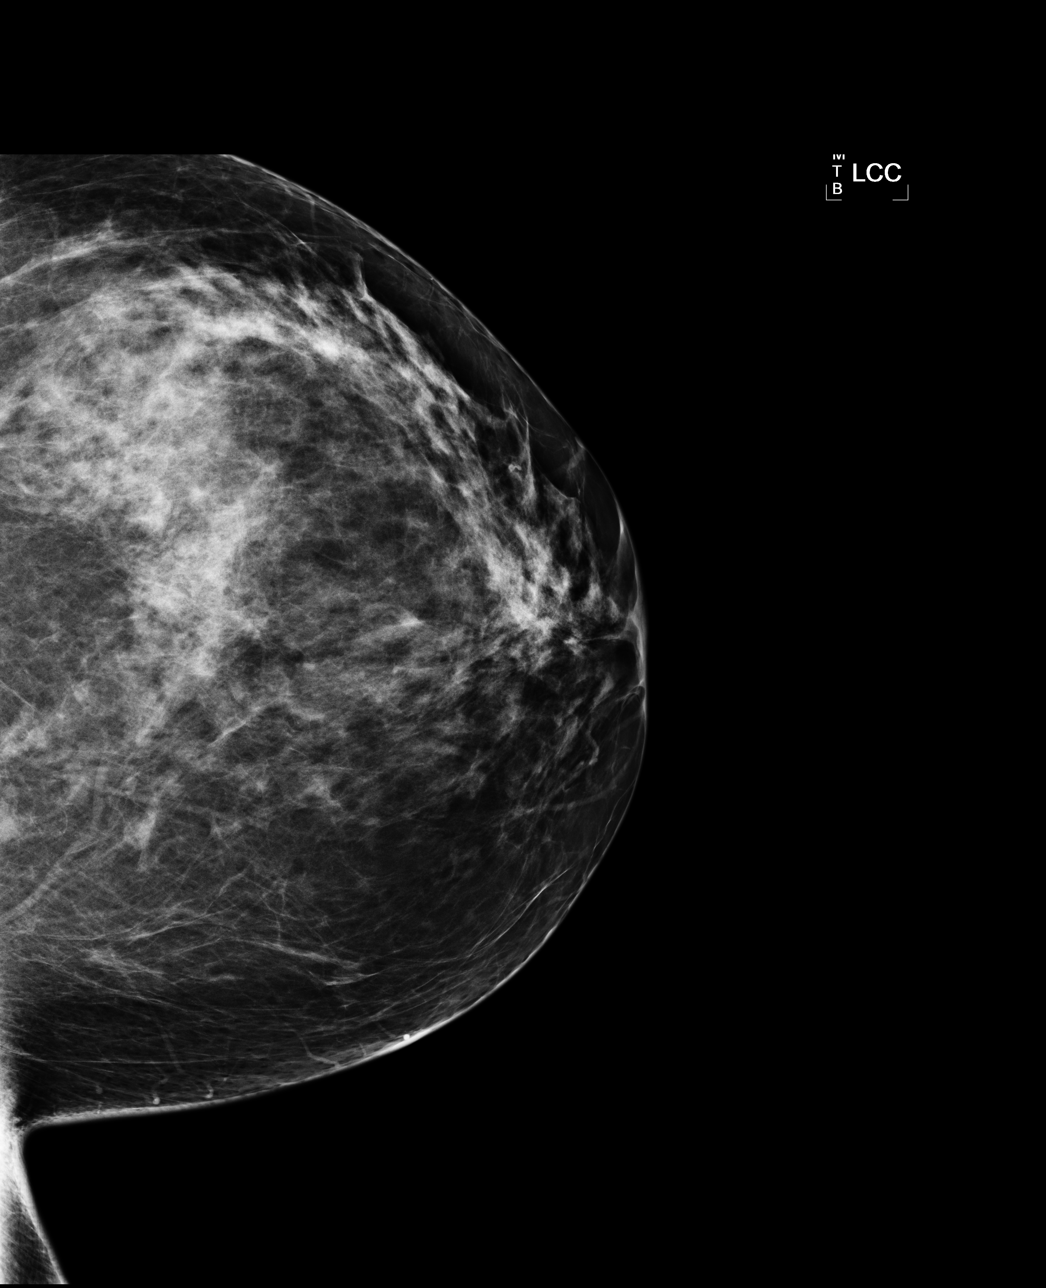

[L MLO]
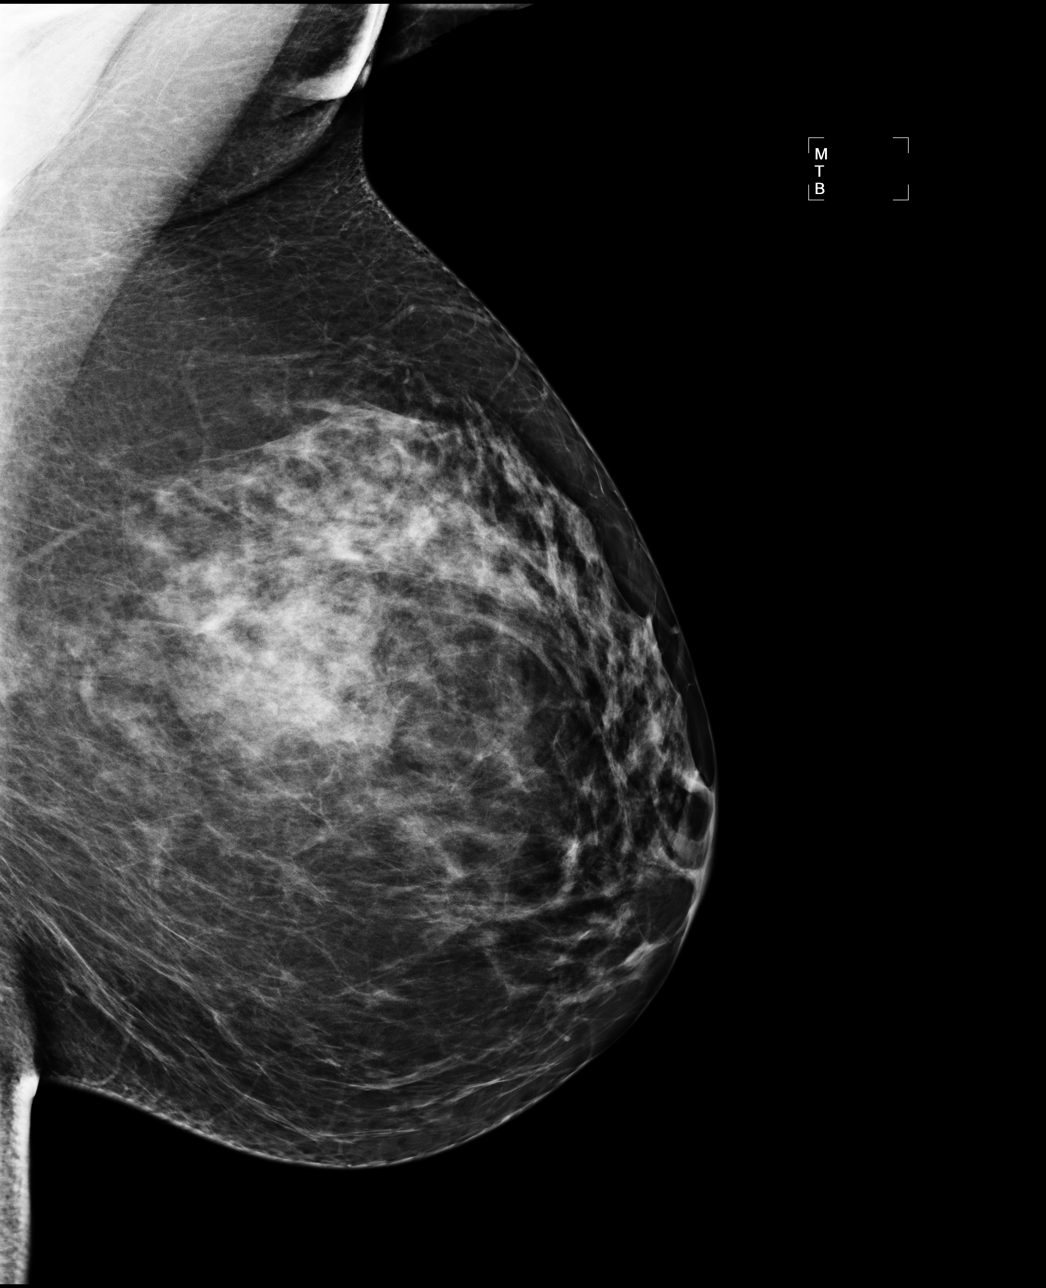

[R MLO]
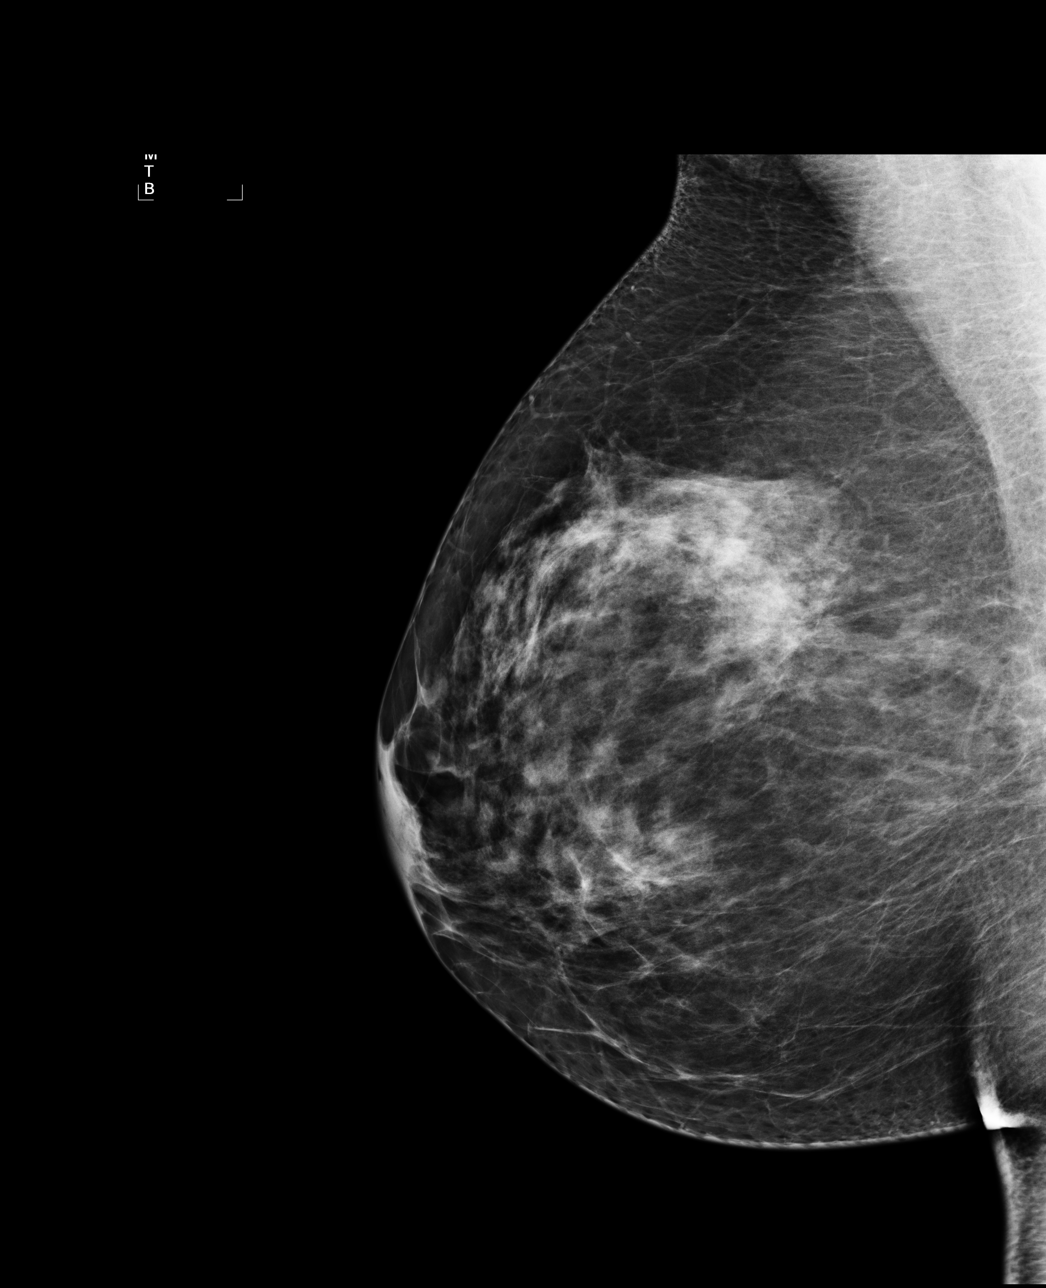

[4 of 4 positions shown; findings below may reference images not displayed]

ACR Breast Density Category c: The breast tissue is heterogeneously
dense, which may obscure small masses
FINDINGS: There are no findings suspicious for malignancy. Images were
processed with CAD.
IMPRESSION: No mammographic evidence of malignancy. A result letter of this
screening mammogram will be mailed directly to the patient.

RECOMMENDATION:
Screening mammogram in one year. (Code:[id number])

BI-RADS CATEGORY  1: Negative.

## 2016-02-21 MED ORDER — GADOBENATE DIMEGLUMINE 529 MG/ML IV SOLN
16.0000 mL | Freq: Once | INTRAVENOUS | Status: AC | PRN
  Administered 2016-02-21: 16 mL via INTRAVENOUS

## 2016-06-16 ENCOUNTER — Encounter: Payer: Self-pay | Admitting: Gynecology

## 2016-07-19 ENCOUNTER — Ambulatory Visit (INDEPENDENT_AMBULATORY_CARE_PROVIDER_SITE_OTHER): Payer: 59 | Admitting: Family Medicine

## 2016-07-19 ENCOUNTER — Encounter: Payer: Self-pay | Admitting: Family Medicine

## 2016-07-19 VITALS — BP 130/96 | HR 85 | Temp 98.2°F | Wt 178.0 lb

## 2016-07-19 DIAGNOSIS — J069 Acute upper respiratory infection, unspecified: Secondary | ICD-10-CM

## 2016-07-19 DIAGNOSIS — J0100 Acute maxillary sinusitis, unspecified: Secondary | ICD-10-CM

## 2016-07-19 MED ORDER — AMOXICILLIN-POT CLAVULANATE 875-125 MG PO TABS: 1.0000 | ORAL_TABLET | Freq: Two times a day (BID) | ORAL | 0 refills | Status: DC

## 2016-07-19 NOTE — Progress Notes (Signed)
Subjective:    Patient ID: Maria Frank, female    DOB: 1975/07/17, 41 y.o.   MRN: [id number]  HPI This is a 41 yo female who presents today with sinus problems (sinus pressure), sore throat, dry cough x 3 days. Started 3 days ago with sore throat. Not sleeping well. Took some allergy medication and a decongestant- no relief. No fever. Bilateral ear pain. Positive sick contacts. Some SOB with cough. No wheeze. Has frequent allergy symptoms and occasional sinus infections.   Past Medical History:  Diagnosis Date  . ALLERGIC RHINITIS   . ARTHRITIS   . CHICKENPOX, HX OF   . DEPRESSION   . HYPERLIPIDEMIA   . IBS (irritable bowel syndrome)   . Migraine    Past Surgical History:  Procedure Laterality Date  . NO PAST SURGERIES     Family History  Problem Relation Age of Onset  . Hypertension Father   . Cardiomyopathy Father   . Sleep apnea Father   . Cancer Mother        pancreatic, liver and renal   . Osteoporosis Mother   . Hypertension Paternal Grandfather   . Arthritis Other        parent & grandparents  . Breast cancer Maternal Aunt 60  . Breast cancer Cousin 30  . Asthma Sister   . Sleep apnea Brother   . Asthma Sister   . Sleep apnea Brother       Review of Systems Per HPI    Objective:   Physical Exam  Constitutional: She is oriented to person, place, and time. She appears well-developed and well-nourished. No distress.  HENT:  Head: Normocephalic and atraumatic.  Right Ear: Tympanic membrane and ear canal normal.  Left Ear: Tympanic membrane and ear canal normal.  Nose: Mucosal edema and rhinorrhea present. Right sinus exhibits maxillary sinus tenderness. Right sinus exhibits no frontal sinus tenderness. Left sinus exhibits maxillary sinus tenderness. Left sinus exhibits no frontal sinus tenderness.  Mouth/Throat: Uvula is midline and mucous membranes are normal. Posterior oropharyngeal erythema present. No oropharyngeal exudate or posterior oropharyngeal  edema.  Eyes: Conjunctivae are normal.  Neck: Normal range of motion. Neck supple.  Cardiovascular: Normal rate, regular rhythm and normal heart sounds.   Pulmonary/Chest: Effort normal and breath sounds normal.  Neurological: She is alert and oriented to person, place, and time.  Skin: Skin is warm and dry. She is not diaphoretic.  Psychiatric: She has a normal mood and affect. Her behavior is normal. Judgment and thought content normal.  Vitals reviewed.     BP (!) 136/100 (BP Location: Right Arm, Patient Position: Sitting, Cuff Size: Normal)   Pulse 85   Temp 98.2 F (36.8 C) (Oral)   Wt 178 lb (80.7 kg)   LMP 07/09/2016   SpO2 99%   BMI 31.28 kg/m  Wt Readings from Last 3 Encounters:  07/19/16 178 lb (80.7 kg)  11/20/15 176 lb (79.8 kg)  07/01/15 180 lb (81.6 kg)       Assessment & Plan:  1. URI with cough and congestion -  Patient Instructions  For nasal congestion you can use Afrin nasal spray for 3 days max, saline nasal spray (generic is fine for all). For cough you can try Delsym. Drink enough fluids to make your urine light yellow. For fever/chill/muscle aches you can take over the counter acetaminophen or ibuprofen- can try low dose, 1 tablet every 8 to 12 hours for throat and ear pain.  Please come back in  if you are not better in 5-7 days or if you develop wheezing, shortness of breath or persistent vomiting.   2. Acute non-recurrent maxillary sinusitis - has history of sinus infections, if symptoms not improved in 5-7 days can start antibiotic - amoxicillin-clavulanate (AUGMENTIN) 875-125 MG tablet; Take 1 tablet by mouth 2 (two) times daily.  Dispense: 14 tablet; Refill: 0  3. Elevated BP - has had decongestant, likely elevated BP  Clarene Reamer, FNP-BC  [facility] [place]  07/19/2016 1:53 PM

## 2016-07-19 NOTE — Patient Instructions (Signed)
For nasal congestion you can use Afrin nasal spray for 3 days max, saline nasal spray (generic is fine for all). For cough you can try Delsym. Drink enough fluids to make your urine light yellow. For fever/chill/muscle aches you can take over the counter acetaminophen or ibuprofen- can try low dose, 1 tablet every 8 to 12 hours for throat and ear pain.  Please come back in if you are not better in 5-7 days or if you develop wheezing, shortness of breath or persistent vomiting.

## 2016-10-25 ENCOUNTER — Other Ambulatory Visit (INDEPENDENT_AMBULATORY_CARE_PROVIDER_SITE_OTHER): Payer: 59

## 2016-10-25 ENCOUNTER — Encounter: Payer: Self-pay | Admitting: Internal Medicine

## 2016-10-25 ENCOUNTER — Ambulatory Visit (INDEPENDENT_AMBULATORY_CARE_PROVIDER_SITE_OTHER): Payer: 59 | Admitting: Internal Medicine

## 2016-10-25 VITALS — BP 144/90 | HR 87 | Temp 98.0°F | Wt 174.0 lb

## 2016-10-25 DIAGNOSIS — R1084 Generalized abdominal pain: Secondary | ICD-10-CM

## 2016-10-25 DIAGNOSIS — R197 Diarrhea, unspecified: Secondary | ICD-10-CM | POA: Insufficient documentation

## 2016-10-25 LAB — COMPREHENSIVE METABOLIC PANEL
ALK PHOS: 53 U/L (ref 39–117)
ALT: 15 U/L (ref 0–35)
AST: 17 U/L (ref 0–37)
Albumin: 4.3 g/dL (ref 3.5–5.2)
BILIRUBIN TOTAL: 0.5 mg/dL (ref 0.2–1.2)
BUN: 11 mg/dL (ref 6–23)
CALCIUM: 9.2 mg/dL (ref 8.4–10.5)
CO2: 26 mEq/L (ref 19–32)
CREATININE: 0.7 mg/dL (ref 0.40–1.20)
Chloride: 103 mEq/L (ref 96–112)
GFR: 97.29 mL/min (ref >60.00)
GLUCOSE: 96 mg/dL (ref 70–99)
Potassium: 3.9 mEq/L (ref 3.5–5.1)
Sodium: 138 mEq/L (ref 135–145)
TOTAL PROTEIN: 7.8 g/dL (ref 6.0–8.3)

## 2016-10-25 LAB — CBC WITH DIFFERENTIAL/PLATELET
Basophils Absolute: 0.1 10*3/uL (ref 0.0–0.1)
Basophils Relative: 0.7 % (ref 0.0–3.0)
EOS PCT: 0.1 % (ref 0.0–5.0)
Eosinophils Absolute: 0 10*3/uL (ref 0.0–0.7)
HEMATOCRIT: 39.3 % (ref 36.0–46.0)
HEMOGLOBIN: 13 g/dL (ref 12.0–15.0)
Lymphocytes Relative: 26.9 % (ref 12.0–46.0)
Lymphs Abs: 3.8 10*3/uL (ref 0.7–4.0)
MCHC: 33.2 g/dL (ref 30.0–36.0)
MCV: 93.2 fl (ref 78.0–100.0)
MONOS PCT: 6.4 % (ref 3.0–12.0)
Monocytes Absolute: 0.9 10*3/uL (ref 0.1–1.0)
Neutro Abs: 9.3 10*3/uL — ABNORMAL HIGH (ref 1.4–7.7)
Neutrophils Relative %: 65.9 % (ref 43.0–77.0)
Platelets: 283 10*3/uL (ref 150.0–400.0)
RBC: 4.21 Mil/uL (ref 3.87–5.11)
RDW: 13.4 % (ref 11.5–15.5)
WBC: 14.1 10*3/uL — AB (ref 4.0–10.5)

## 2016-10-25 NOTE — Progress Notes (Signed)
Subjective:    Patient ID: Maria Frank, female    DOB: 05-21-1975, 41 y.o.   MRN: [id number]  HPI She is here for an acute visit.   Sunday (yesterday) at 2 am she had severe diarrhea about every 90 minutes.  At 5 am there was blood in the stool.  She had bloody diarrhea all day yesterday.  Today she has two episodes of bloody diarrhea.  She has no appetite.  She has had abdominal pain and cramping with a bowel movement and in between.  She had cold sweats Sunday morning.  She thinks she passed out on the bathroom floor yesterday - she does not recall falling - she just realized she was on the floor.    She is trying to drink enough.  She has eaten only a little.    She ate someplace new Saturday night, but ate chicken, fries.  In the past food related diarrhea has gone away faster and she has never had blood.  No travel in past 6 months. No anitibiotics in past 6 months.    She was diagnosed with IBS at 41 years old.  She had a rectal exam, stool study.  She did not have a colonoscopy.  She never had blood in her stool.  No family history of IBD or colorectal cancer.    Medications and allergies reviewed with patient and updated if appropriate.  Patient Active Problem List   Diagnosis Date Noted  . Upper back pain on left side 07/01/2015  . Onychomycosis 03/04/2015  . Athlete's foot 03/04/2015  . Renal cyst 03/04/2015  . Osteopenia 03/04/2015  . Insomnia 03/04/2015  . IUD (intrauterine device) in place 12/01/2012  . Mild mitral regurgitation 07/03/2010  . HYPERLIPIDEMIA 03/30/2010  . Mild depression ([place]) 03/30/2010  . ALLERGIC RHINITIS 03/30/2010  . ARTHRITIS 03/30/2010  . HEADACHE 03/30/2010    Current Outpatient Prescriptions on File Prior to Visit  Medication Sig Dispense Refill  . ALPRAZolam (XANAX) 0.5 MG tablet Take 0.25 mg by mouth at bedtime as needed for anxiety.     . Calcium-Magnesium-Vitamin D (CITRACAL CALCIUM+D) 600-40-500 MG-MG-UNIT TB24 Take 1 tablet by  mouth 2 (two) times daily.    . clotrimazole (LOTRIMIN AF) 1 % cream Apply twice daily for one week and then daily for 4 weeks 40 g 1  . Efinaconazole 10 % SOLN Apply daily for 48 weeks 8 mL 2  . EPIPEN 2-PAK 0.3 MG/0.3ML SOAJ injection Inject 0.3 mLs (0.3 mg total) into the skin once as needed. 1 Device   . loratadine (CLARITIN) 10 MG tablet Take 1 tablet (10 mg total) by mouth daily. 30 tablet 11  . mometasone (NASONEX) 50 MCG/ACT nasal spray 2 sprays by Nasal route daily.      Marland Kitchen PAZEO 0.7 % SOLN PLACE 1 DROP INTO BOTH EYES EVERY DAY  6   No current facility-administered medications on file prior to visit.     Past Medical History:  Diagnosis Date  . ALLERGIC RHINITIS   . ARTHRITIS   . CHICKENPOX, HX OF   . DEPRESSION   . HYPERLIPIDEMIA   . IBS (irritable bowel syndrome)   . Migraine     Past Surgical History:  Procedure Laterality Date  . NO PAST SURGERIES      Social History   Social History  . Marital status: Married    Spouse name: N/A  . Number of children: N/A  . Years of education: N/A   Social History Main  Topics  . Smoking status: Never Smoker  . Smokeless tobacco: Never Used  . Alcohol use 0.0 oz/week     Comment: Social  . Drug use: No  . Sexual activity: Yes    Birth control/ protection: None   Other Topics Concern  . None   Social History Narrative   Married since 2004, but separated 10/2012 from spouse. Originally from [place], in [place] since 2008. 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 Hypertension Father   . Cardiomyopathy Father   . Sleep apnea Father   . Cancer Mother        pancreatic, liver and renal   . Osteoporosis Mother   . Hypertension Paternal Grandfather   . Arthritis Other        parent & grandparents  . Breast cancer Maternal Aunt 60  . Breast cancer Cousin 30  . Asthma Sister   . Sleep apnea Brother   . Asthma Sister   . Sleep apnea Brother     Review of Systems    Constitutional: Positive for appetite change, chills and diaphoresis.  Gastrointestinal: Positive for abdominal pain, blood in stool, diarrhea and nausea.  Neurological: Positive for headaches (yesterday).       Objective:   Vitals:   10/25/16 1546  BP: (!) 144/90  Pulse: 87  Temp: 98 F (36.7 C)  SpO2: 99%   Filed Weights   10/25/16 1546  Weight: 174 lb (78.9 kg)   Body mass index is 30.58 kg/m.  Wt Readings from Last 3 Encounters:  10/25/16 174 lb (78.9 kg)  07/19/16 178 lb (80.7 kg)  11/20/15 176 lb (79.8 kg)     Physical Exam  Constitutional: She appears well-developed and well-nourished. No distress.  HENT:  Head: Normocephalic and atraumatic.  Eyes: Conjunctivae are normal.  Abdominal: Soft. She exhibits no distension and no mass. There is tenderness (across lower abdomen). There is no rebound and no guarding.  Decreased BS  Musculoskeletal: She exhibits no edema.  Skin: Skin is warm and dry. She is not diaphoretic.          Assessment & Plan:   See Problem List for Assessment and Plan of chronic medical problems.

## 2016-10-25 NOTE — Patient Instructions (Signed)
  Test(s) ordered today. Your results will be released to [facility] (or called to you) after review, usually within 72hours after test completion. If any changes need to be made, you will be notified at that same time.   Medications reviewed and updated.  No changes recommended at this time.  A Ct scan was ordered.    A referral was ordered for GI.

## 2016-10-25 NOTE — Assessment & Plan Note (Signed)
Started yesterday Associated with abdominal pain, nausea, dec appetite Cbc, cmp today Stool studies CT Ab/Pevlis to evaluate for possible colitis Refer to GI stat - ? Bacterial gastroenteritis, colitis, IBD

## 2016-10-25 NOTE — Assessment & Plan Note (Signed)
Pain across lower abdomen No rebound or guarding Associated with bloody diarrhea, nausea and dec appetite Ct Ab/pelvis Cbc, cmp, stool studies Referred to GI given other symptoms

## 2016-10-26 ENCOUNTER — Ambulatory Visit (INDEPENDENT_AMBULATORY_CARE_PROVIDER_SITE_OTHER)
Admission: RE | Admit: 2016-10-26 | Discharge: 2016-10-26 | Disposition: A | Discharge status: Home / Self Care | Payer: 59 | Source: Ambulatory Visit | Attending: Internal Medicine | Admitting: Internal Medicine

## 2016-10-26 ENCOUNTER — Other Ambulatory Visit: Payer: Self-pay | Admitting: Internal Medicine

## 2016-10-26 ENCOUNTER — Other Ambulatory Visit: Payer: 59

## 2016-10-26 DIAGNOSIS — R197 Diarrhea, unspecified: Secondary | ICD-10-CM

## 2016-10-26 DIAGNOSIS — R1084 Generalized abdominal pain: Secondary | ICD-10-CM

## 2016-10-26 MED ORDER — METRONIDAZOLE 500 MG PO TABS: 500.0000 mg | ORAL_TABLET | Freq: Three times a day (TID) | ORAL | 0 refills | Status: DC

## 2016-10-26 MED ORDER — CIPROFLOXACIN HCL 500 MG PO TABS: 500.0000 mg | ORAL_TABLET | Freq: Two times a day (BID) | ORAL | 0 refills | Status: DC

## 2016-10-26 MED ORDER — IOPAMIDOL (ISOVUE-300) INJECTION 61%
100.0000 mL | Freq: Once | INTRAVENOUS | Status: AC | PRN
  Administered 2016-10-26: 100 mL via INTRAVENOUS

## 2016-10-27 ENCOUNTER — Ambulatory Visit (INDEPENDENT_AMBULATORY_CARE_PROVIDER_SITE_OTHER): Payer: 59 | Admitting: Physician Assistant

## 2016-10-27 ENCOUNTER — Encounter: Payer: Self-pay | Admitting: Physician Assistant

## 2016-10-27 VITALS — BP 100/76 | HR 72 | Ht 63.0 in | Wt 176.0 lb

## 2016-10-27 DIAGNOSIS — R935 Abnormal findings on diagnostic imaging of other abdominal regions, including retroperitoneum: Secondary | ICD-10-CM

## 2016-10-27 DIAGNOSIS — K921 Melena: Secondary | ICD-10-CM | POA: Diagnosis not present

## 2016-10-27 DIAGNOSIS — R1084 Generalized abdominal pain: Secondary | ICD-10-CM

## 2016-10-27 DIAGNOSIS — R197 Diarrhea, unspecified: Secondary | ICD-10-CM | POA: Diagnosis not present

## 2016-10-27 LAB — FECAL LACTOFERRIN, QUANT
FECAL LACTOFERRIN: POSITIVE — AB
MICRO NUMBER: 81061680
SPECIMEN QUALITY:: ADEQUATE

## 2016-10-27 MED ORDER — HYOSCYAMINE SULFATE 0.125 MG SL SUBL: 0.1250 mg | SUBLINGUAL_TABLET | SUBLINGUAL | 0 refills | Status: DC | PRN

## 2016-10-27 NOTE — Progress Notes (Addendum)
Chief Complaint: Abdominal pain, hematochezia, diarrhea, nausea, abnormal CT of the abdomen  HPI:  [name] is a 41 year old Caucasian female with a past medical history as listed below,  who was referred to me by Binnie Rail, MD for a complaint of abdominal pain with hematochezia, diarrhea, nausea and abnormal CT of the abdomen .      Patient's was seen by her PCP 10/25/16 and had a CMP which was normal and a CBC which showed an elevated white count of 14.1. Patient also had stool culture, white blood cell count, lactoferrin, stool Giardia and crypto ordered. CT of the abdomen and pelvis on that day showed equivocal wall thickening involving the distal transverse colon. Could not exclude mild or resolving infectious or ischemic colitis. Mild but significantly age advanced aortic and branch vessel atherosclerosis. Patient was started on Ciprofloxacin 500 mg twice a day and Metronidazole 500 mg 3 times a day 10 days.   Today, the patient presents to clinic and explains that on the evening of 10/23/16 after eating out at a "new place for dinner", the patient had fried chicken and fries. She started with a lot of abdominal cramping. Patient tells me that she was diagnosed with IBS at the age of 73 and this did not feel much different than her typical abdominal cramps after eating "the wrong thing". Patient continued with these cramps into the night and around 2 AM started with worsened cramping and diarrhea which would occur about every 90 minutes. This continued into the wee hours of the morning and at 5 AM she started seeing some bright red blood in her stools. This continued into the next day. On 10/25/16 shw was seen by her PCP reported just having 2 bloody bowel movements that morning, but continued with cramping abdominal pain, loss of appetite and some nausea. Since that time, the patient has only had 1 or 2 bowel movements per day and has not seen any blood in her stool for the past 12 hours.  Patient describes that she is forcing herself to eat because she knows she needs to but is still slightly nauseous. Patient did start her antibiotics last night. She has only taken two doses. She also continue with some abdominal cramping that comes in waves every hour or so.    Patient denies a documented fever, weight loss, vomiting, heartburn or reflux.  Past Medical History:  Diagnosis Date  . ALLERGIC RHINITIS   . ARTHRITIS   . CHICKENPOX, HX OF   . DEPRESSION   . HYPERLIPIDEMIA   . IBS (irritable bowel syndrome)   . Migraine     Past Surgical History:  Procedure Laterality Date  . NO PAST SURGERIES      Current Outpatient Prescriptions  Medication Sig Dispense Refill  . ALPRAZolam (XANAX) 0.5 MG tablet Take 0.25 mg by mouth at bedtime as needed for anxiety.     . ciprofloxacin (CIPRO) 500 MG tablet Take 1 tablet (500 mg total) by mouth 2 (two) times daily. 20 tablet 0  . EPIPEN 2-PAK 0.3 MG/0.3ML SOAJ injection Inject 0.3 mLs (0.3 mg total) into the skin once as needed. 1 Device   . metroNIDAZOLE (FLAGYL) 500 MG tablet Take 1 tablet (500 mg total) by mouth 3 (three) times daily. 30 tablet 0  . mometasone (NASONEX) 50 MCG/ACT nasal spray 2 sprays by Nasal route daily.      . Multiple Vitamin (MULTIVITAMIN) tablet Take 1 tablet by mouth daily.    Marland Kitchen PAZEO  0.7 % SOLN PLACE 1 DROP INTO BOTH EYES EVERY DAY  6   No current facility-administered medications for this visit.     Allergies as of 10/27/2016 - Review Complete 10/27/2016  Allergen Reaction Noted  . Latex      Family History  Problem Relation Age of Onset  . Hypertension Father   . Cardiomyopathy Father   . Sleep apnea Father   . Cancer Mother        pancreatic, liver and renal   . Osteoporosis Mother   . Renal cancer Mother   . Hypertension Paternal Grandfather   . Arthritis Other        parent & grandparents  . Breast cancer Maternal Aunt 60  . Breast cancer Cousin 30  . Asthma Sister   . Sleep apnea  Brother   . Asthma Sister   . Sleep apnea Brother     Social History   Social History  . Marital status: Married    Spouse name: N/A  . Number of children: N/A  . Years of education: N/A   Occupational History  . Not on file.   Social History Main Topics  . Smoking status: Never Smoker  . Smokeless tobacco: Never Used  . Alcohol use 0.0 oz/week     Comment: Social  . Drug use: No  . Sexual activity: Yes    Birth control/ protection: None   Other Topics Concern  . Not on file   Social History Narrative   Married since 2004, but separated 10/2012 from spouse. Originally from [place], in [place] since 2008. 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    Review of Systems:    Constitutional: No weight loss Skin: No rash  Cardiovascular: No chest pain Respiratory: No SOB Gastrointestinal: See HPI and otherwise negative Genitourinary: No dysuria  Neurological: No headache Musculoskeletal: No new muscle or joint pain Hematologic: No bruising Psychiatric: No history of depression or anxiety   Physical Exam:  Vital signs: BP 100/76   Pulse 72   Ht 5\' 3"  (1.6 m)   Wt 176 lb (79.8 kg)   LMP 10/11/2016 (Approximate)   BMI 31.18 kg/m   Constitutional:   Pleasant Caucasian female appears to be in NAD, Well developed, Well nourished, alert and cooperative Head:  Normocephalic and atraumatic. Eyes:   PEERL, EOMI. No icterus. Conjunctiva pink. Ears:  Normal auditory acuity. Neck:  Supple Throat: Oral cavity and pharynx without inflammation, swelling or lesion.  Respiratory: Respirations even and unlabored. Lungs clear to auscultation bilaterally.   No wheezes, crackles, or rhonchi.  Cardiovascular: Normal S1, S2. No MRG. Regular rate and rhythm. No peripheral edema, cyanosis or pallor.  Gastrointestinal:  Soft, nondistended, mild generalized ttp, worse in b/l lower quadrants, No rebound or guarding. Normal bowel sounds. No appreciable masses or hepatomegaly. Rectal:  Not performed.    Msk:  Symmetrical without gross deformities. Without edema, no deformity or joint abnormality.  Neurologic:  Alert and  oriented x4;  grossly normal neurologically.  Skin:   Dry and intact without significant lesions or rashes. Psychiatric:  Demonstrates good judgement and reason without abnormal affect or behaviors.  MOST RECENT LABS AND IMAGING: CBC    Component Value Date/Time   WBC 14.1 (H) 10/25/2016 1632   RBC 4.21 10/25/2016 1632   HGB 13.0 10/25/2016 1632   HCT 39.3 10/25/2016 1632   PLT 283.0 10/25/2016 1632   MCV 93.2 10/25/2016 1632   MCHC 33.2 10/25/2016 1632   RDW 13.4 10/25/2016 1632  LYMPHSABS 3.8 10/25/2016 1632   MONOABS 0.9 10/25/2016 1632   EOSABS 0.0 10/25/2016 1632   BASOSABS 0.1 10/25/2016 1632    CMP     Component Value Date/Time   NA 138 10/25/2016 1632   K 3.9 10/25/2016 1632   CL 103 10/25/2016 1632   CO2 26 10/25/2016 1632   GLUCOSE 96 10/25/2016 1632   BUN 11 10/25/2016 1632   CREATININE 0.70 10/25/2016 1632   CALCIUM 9.2 10/25/2016 1632   PROT 7.8 10/25/2016 1632   ALBUMIN 4.3 10/25/2016 1632   AST 17 10/25/2016 1632   ALT 15 10/25/2016 1632   ALKPHOS 53 10/25/2016 1632   BILITOT 0.5 10/25/2016 1632   EXAM: CT ABDOMEN AND PELVIS WITH CONTRAST  TECHNIQUE: Multidetector CT imaging of the abdomen and pelvis was performed using the standard protocol following bolus administration of intravenous contrast.  CONTRAST:  116mL ISOVUE-300 IOPAMIDOL (ISOVUE-300) INJECTION 61%  COMPARISON:  Abdominal MRI of 02/21/2016. 01/16/2015 abdominopelvic CT.  FINDINGS: Lower chest: Clear lung bases. Normal heart size without pericardial or pleural effusion.  Hepatobiliary: Normal liver. Normal gallbladder, without biliary ductal dilatation.  Pancreas: Normal, without mass or ductal dilatation.  Spleen: Normal in size, without focal abnormality.  Adrenals/Urinary Tract: Normal adrenal glands. Normal right kidney. Hemorrhagic cyst  involving the upper pole left kidney measures 1.3 cm and is similar in size to 01/16/2015. No hydronephrosis. Normal urinary bladder.  Stomach/Bowel: Normal stomach, without wall thickening. equivocal wall thickening involving the distal transverse and splenic flexure colon, including on images 33 through 21. The remainder of the colon and terminal ileum are normal. Normal small bowel.  Vascular/Lymphatic: Mild but significantly age advanced aortic and branch vessel atherosclerosis. Patent mesenteric vessels. No mesenteric venous abnormality. No abdominopelvic adenopathy.  Reproductive: Intrauterine device which is positioned only within the body and lower uterine segment. Absent from the uterine fundus, including on sagittal image 58. This is similar. Similar asymmetric position, with the right-sided arm terminating deep within the uterus including on image 67/series 2. No adnexal mass.  Other: No significant free fluid.  Musculoskeletal: No acute osseous abnormality.  IMPRESSION: 1. Equivocal wall thickening involving the distal transverse colon. Cannot exclude mild or resolving infectious or ischemic colitis. 2. Mild but significantly age advanced aortic and branch vessel atherosclerosis. 3. Similar atypical position of the intrauterine device compared to 01/16/15. Please see description above.   Electronically Signed   By: Abigail Miyamoto M.D.   On: 10/26/2016 14:54  Assessment: 1. Abnormal CT abdomen: Showing suspected ischemic versus infectious colitis, patient experiencing symptoms of acute abdominal pain with hematochezia and diarrhea, now decreasing and slowing, started Cipro and Flagyl yesterday 2. Diarrhea 3. Hematochezia 4. Abdominal pain  Plan: 1. Discussed with the patient that likely this represents infectious versus ischemic colitis. I would suspect that with her antibiotics Cipro and Flagyl her symptoms will gradually decrease and stop. 2. Will await  results from recently ordered stool studies 3. Prescribed Hyoscyamine sulfate 0.125 mg every 4-6 hours as needed for abdominal cramping 4. Patient to follow in clinic with me in 2-3 weeks. If symptoms are not resolved at that time, will discuss a colonoscopy  Ellouise Newer, PA-C [facility] 10/27/2016, 10:21 AM  Cc: Binnie Rail, MD   Addendum: Reviewed and agree with initial management. Awaiting stool studies.  If any symptoms persist at follow-up, colonoscopy is recommended. Pyrtle, Lajuan Lines, MD

## 2016-10-30 LAB — STOOL CULTURE
MICRO NUMBER: 81060677
MICRO NUMBER:: 81060675
MICRO NUMBER:: 81060676
SHIGA RESULT: NOT DETECTED
SPECIMEN QUALITY:: ADEQUATE
SPECIMEN QUALITY:: ADEQUATE
SPECIMEN QUALITY:: ADEQUATE

## 2016-11-01 HISTORY — PX: COLONOSCOPY: SHX174

## 2016-11-02 LAB — GIARDIA/CRYPTOSPORIDIUM (EIA)
MICRO NUMBER:: 81060764
MICRO NUMBER:: 81072708
RESULT: NOT DETECTED
RESULT: NOT DETECTED
SPECIMEN QUALITY: ADEQUATE
SPECIMEN QUALITY:: ADEQUATE

## 2016-11-09 ENCOUNTER — Encounter: Payer: Self-pay | Admitting: Internal Medicine

## 2016-11-09 ENCOUNTER — Ambulatory Visit (INDEPENDENT_AMBULATORY_CARE_PROVIDER_SITE_OTHER): Payer: 59 | Admitting: Physician Assistant

## 2016-11-09 ENCOUNTER — Encounter: Payer: Self-pay | Admitting: Physician Assistant

## 2016-11-09 VITALS — BP 122/78 | HR 100 | Ht 63.25 in | Wt 177.0 lb

## 2016-11-09 DIAGNOSIS — R935 Abnormal findings on diagnostic imaging of other abdominal regions, including retroperitoneum: Secondary | ICD-10-CM | POA: Diagnosis not present

## 2016-11-09 DIAGNOSIS — R194 Change in bowel habit: Secondary | ICD-10-CM

## 2016-11-09 DIAGNOSIS — K921 Melena: Secondary | ICD-10-CM

## 2016-11-09 DIAGNOSIS — R197 Diarrhea, unspecified: Secondary | ICD-10-CM | POA: Diagnosis not present

## 2016-11-09 DIAGNOSIS — R1084 Generalized abdominal pain: Secondary | ICD-10-CM | POA: Diagnosis not present

## 2016-11-09 MED ORDER — NA SULFATE-K SULFATE-MG SULF 17.5-3.13-1.6 GM/177ML PO SOLN: 1.0000 | Freq: Once | ORAL | 0 refills | Status: AC

## 2016-11-09 NOTE — Progress Notes (Addendum)
Chief Complaint: Follow-up for abdominal pain, hematochezia, diarrhea and nausea  HPI:  Maria Frank is a 41 year old Caucasian female with a past medical history as listed below, who returns to clinic today for follow-up of her abdominal pain with hematochezia, diarrhea, nausea and abnormal CT of the abdomen.    Please recall patient was initially seen in clinic 10/27/16 and at that time had a recent CT of the abdomen and pelvis which showed a wall thickening involving the distal transverse colon. Patient also had stool studies which were still pending and have returned negative since that time. She had been started on Ciprofloxacin 500 mg twice a day and  Metronidazole 500 mg 3 times a day for 10 days by her PCP on 10/25/16. On the day of her visit the patient described eating out at a new place for dinner on 10/23/16 and starting with a lot of abdominal cramping which worsened throughout the night and evolved to diarrhea. Patient then started seeing some bright red blood in her stools. At time of visit the patient had only had 1-2 bowel movements per day and had not seen any further blood for the past 12 hours. She did continue to describe some nausea and had only taken 2 doses of her antibiotics. Her abdominal cramping was "coming in waves". It was discussed that this event was either ischemic or infectious, she was told to finish her antibiotics as prescribed. She was also given Hyoscyamine sulfate 0.125 mg every 4-6 hours as needed.    Today, the patient expresses that she feels about 80% better. She does continue with only loose stools though, at least 2 or 3 times a day. Patient also continues with abdominal cramping which occurs 1-2 hours after eating. She has remained on a very bland diet as well as following the low FODMAP and has not had a fresh fruits or vegetable in 4 weeks. The patient tells me she also continues with some nausea if she bends over. She does tell me Hyoscyamine helps with the  abdominal cramping and she only uses this as needed. Patient denies any further hematochezia. She does continue with a lack of appetite. Overall the patient is improved but still would like to make sure that "nothing else is going on".   Patient denies fever, chills, melena, weight loss, heartburn or reflux.  Past Medical History:  Diagnosis Date  . ALLERGIC RHINITIS   . ARTHRITIS   . CHICKENPOX, HX OF   . DEPRESSION   . HYPERLIPIDEMIA   . IBS (irritable bowel syndrome)   . Migraine     Past Surgical History:  Procedure Laterality Date  . NO PAST SURGERIES      Current Outpatient Prescriptions  Medication Sig Dispense Refill  . ALPRAZolam (XANAX) 0.5 MG tablet Take 0.25 mg by mouth at bedtime as needed for anxiety.     . ciprofloxacin (CIPRO) 500 MG tablet Take 1 tablet (500 mg total) by mouth 2 (two) times daily. 20 tablet 0  . EPIPEN 2-PAK 0.3 MG/0.3ML SOAJ injection Inject 0.3 mLs (0.3 mg total) into the skin once as needed. 1 Device   . hyoscyamine (LEVSIN SL) 0.125 MG SL tablet Place 1 tablet (0.125 mg total) under the tongue every 4 (four) hours as needed. 30 tablet 0  . metroNIDAZOLE (FLAGYL) 500 MG tablet Take 1 tablet (500 mg total) by mouth 3 (three) times daily. 30 tablet 0  . mometasone (NASONEX) 50 MCG/ACT nasal spray 2 sprays by Nasal route daily.      Marland Kitchen  Multiple Vitamin (MULTIVITAMIN) tablet Take 1 tablet by mouth daily.    Marland Kitchen PAZEO 0.7 % SOLN PLACE 1 DROP INTO BOTH EYES EVERY DAY  6   No current facility-administered medications for this visit.     Allergies as of 11/09/2016 - Review Complete 10/27/2016  Allergen Reaction Noted  . Latex      Family History  Problem Relation Age of Onset  . Hypertension Father   . Cardiomyopathy Father   . Sleep apnea Father   . Cancer Mother        pancreatic, liver and renal   . Osteoporosis Mother   . Renal cancer Mother   . Hypertension Paternal Grandfather   . Arthritis Other        parent & grandparents  . Breast  cancer Maternal Aunt 60  . Breast cancer Cousin 30  . Asthma Sister   . Sleep apnea Brother   . Asthma Sister   . Sleep apnea Brother     Social History   Social History  . Marital status: Married    Spouse name: N/A  . Number of children: N/A  . Years of education: N/A   Occupational History  . Not on file.   Social History Main Topics  . Smoking status: Never Smoker  . Smokeless tobacco: Never Used  . Alcohol use 0.0 oz/week     Comment: Social  . Drug use: No  . Sexual activity: Yes    Birth control/ protection: None   Other Topics Concern  . Not on file   Social History Narrative   Married since 2004, but separated 10/2012 from spouse. Originally from [place], in [place] since 2008. 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 Lungs clear to auscultation bilaterally.   No wheezes, crackles, or rhonchi.  Cardiovascular: Normal S1, S2. No MRG. Regular rate and rhythm. No peripheral edema, cyanosis or pallor.  Gastrointestinal:  Soft, nondistended, nontender. No rebound or guarding. Normal bowel sounds. No appreciable masses or hepatomegaly. Rectal:  Not performed.  Psychiatric:  Demonstrates good judgement and reason without abnormal affect or behaviors.  No recent labs or imaging.  Assessment: 1. Abdominal pain: Patient continues with abdominal cramping at least 2 times a day typically an hour or 2 after eating, consider relation to IBS versus recent colitis 2. Abnormal CT of the  abdomen: Showing colitis in the distal transverse colon on 10/26/16 3. Diarrhea: Patient continues with 2 loose stools per day, stool studies show a positive lactoferrin and are otherwise negative 4. Hematochezia: None since being seen last 10/07/16  Plan: 1. Patient is mostly improved after Cipro and Flagyl for 2 weeks for suspected infectious colitis, but does continue with some abdominal cramping and nausea when bending over and has a lack of appetite. She would like further investigation with a colonoscopy to "ensure that nothing else is going on". 2. Scheduled patient for a colonoscopy in the [facility] with Dr. Hilarie Fredrickson. Discussed risks, benefits, limitations  and alternatives and the patient agrees to proceed. 3. Patient may remain on the low FODMAP diet for now, for a total of 6 weeks. We discussed this. 4. Patient may continue Hyoscyamine as needed for abdominal cramping 5. Patient to return to clinic per recommendation from Dr. Hilarie Fredrickson after time of procedure.  Ellouise Newer, PA-C [facility] 11/09/2016, 11:09 AM  Cc: Binnie Rail, MD   Addendum: Reviewed and agree with management. Pyrtle, Lajuan Lines, MD

## 2016-11-09 NOTE — Patient Instructions (Signed)
If you are age 42 or older, your body mass index should be between 23-30. Your Body mass index is 31.11 kg/m. If this is out of the aforementioned range listed, please consider follow up with your Primary Care Provider.  If you are age 29 or younger, your body mass index should be between 19-25. Your Body mass index is 31.11 kg/m. If this is out of the aformentioned range listed, please consider follow up with your Primary Care Provider.   We have sent the following medications to your pharmacy for you to pick up at your convenience:  [place] have been scheduled for a colonoscopy. Please follow written instructions given to you at your visit today.  Please pick up your prep supplies at the pharmacy within the next 1-3 days. If you use inhalers (even only as needed), please bring them with you on the day of your procedure. Your physician has requested that you go to www.startemmi.com and enter the access code given to you at your visit today. This web site gives a general overview about your procedure. However, you should still follow specific instructions given to you by our office regarding your preparation for the procedure.  Please continue Hyocyamine as instructed by Ellouise Newer, PA  Thank you.

## 2016-11-17 ENCOUNTER — Encounter: Payer: Self-pay | Admitting: Internal Medicine

## 2016-11-17 ENCOUNTER — Ambulatory Visit (AMBULATORY_SURGERY_CENTER): Payer: 59 | Admitting: Internal Medicine

## 2016-11-17 VITALS — BP 128/81 | HR 71 | Temp 99.3°F | Resp 15 | Ht 63.25 in | Wt 177.0 lb

## 2016-11-17 DIAGNOSIS — R197 Diarrhea, unspecified: Secondary | ICD-10-CM | POA: Diagnosis not present

## 2016-11-17 DIAGNOSIS — K921 Melena: Secondary | ICD-10-CM | POA: Diagnosis present

## 2016-11-17 DIAGNOSIS — R933 Abnormal findings on diagnostic imaging of other parts of digestive tract: Secondary | ICD-10-CM | POA: Diagnosis not present

## 2016-11-17 DIAGNOSIS — K599 Functional intestinal disorder, unspecified: Secondary | ICD-10-CM | POA: Diagnosis not present

## 2016-11-17 MED ORDER — SODIUM CHLORIDE 0.9 % IV SOLN: 500.0000 mL | INTRAVENOUS | Status: DC

## 2016-11-17 MED ORDER — DICYCLOMINE HCL 10 MG PO CAPS: 20.0000 mg | ORAL_CAPSULE | Freq: Four times a day (QID) | ORAL | 1 refills | Status: DC | PRN

## 2016-11-17 MED ORDER — DIPHENOXYLATE-ATROPINE 2.5-0.025 MG PO TABS: 1.0000 | ORAL_TABLET | Freq: Four times a day (QID) | ORAL | 1 refills | Status: DC | PRN

## 2016-11-17 NOTE — Op Note (Signed)
[place] [facility] Patient Name: Maria Frank Procedure Date: 11/17/2016 3:28 PM MRN: [id number] Endoscopist: Jerene Bears , MD Age: 41 Referring MD:  Date of Birth: [birth year] Gender: Female Account #: [id number] Procedure:                Colonoscopy Indications:              Abdominal pain, Clinically significant diarrhea of                            unexplained origin, Hematochezia Medicines:                Monitored Anesthesia Care Procedure:                Pre-Anesthesia Assessment:                           - Prior to the procedure, a History and Physical                            was performed, and patient medications and                            allergies were reviewed. The patient's tolerance of                            previous anesthesia was also reviewed. The risks                            and benefits of the procedure and the sedation                            options and risks were discussed with the patient.                            All questions were answered, and informed consent                            was obtained. Prior Anticoagulants: The patient has                            taken no previous anticoagulant or antiplatelet                            agents. ASA Grade Assessment: II - A patient with                            mild systemic disease. After reviewing the risks                            and benefits, the patient was deemed in                            satisfactory condition to undergo the procedure.  After obtaining informed consent, the colonoscope                            was passed under direct vision. Throughout the                            procedure, the patient's blood pressure, pulse, and                            oxygen saturations were monitored continuously. The                            Colonoscope was introduced through the anus and                            advanced to the the terminal  ileum. The colonoscopy                            was performed without difficulty. The patient                            tolerated the procedure well. The quality of the                            bowel preparation was good. The terminal ileum,                            ileocecal valve, appendiceal orifice, and rectum                            were photographed. Scope In: 3:44:58 PM Scope Out: 3:57:52 PM Scope Withdrawal Time: 0 hours 9 minutes 34 seconds  Total Procedure Duration: 0 hours 12 minutes 54 seconds  Findings:                 The digital rectal exam was normal.                           The terminal ileum appeared normal.                           Normal mucosa was found in the entire colon.                            Biopsies for histology were taken with a cold                            forceps from the right colon and left colon for                            evaluation of microscopic colitis.                           The retroflexed view of the distal rectum and anal  verge was normal and showed no anal or rectal                            abnormalities. Complications:            No immediate complications. Estimated Blood Loss:     Estimated blood loss was minimal. Impression:               - The examined portion of the ileum was normal.                           - Normal mucosa in the entire examined colon.                            Biopsied.                           - The distal rectum and anal verge are normal on                            retroflexion view. Recommendation:           - Patient has a contact number available for                            emergencies. The signs and symptoms of potential                            delayed complications were discussed with the                            patient. Return to normal activities tomorrow.                            Written discharge instructions were provided to the                             patient.                           - Resume previous diet.                           - Continue present medications.                           - Await pathology results. If biopsy results                            unrevealing then I recommend a course of rifaximin                            for diarrhea.                           - Repeat colonoscopy at age 41 for screening  purposes. Jerene Bears, MD 11/17/2016 4:08:37 PM This report has been signed electronically.

## 2016-11-17 NOTE — Progress Notes (Signed)
Called to room to assist during endoscopic procedure.  Patient ID and intended procedure confirmed with present staff. Received instructions for my participation in the procedure from the performing physician.  

## 2016-11-17 NOTE — Progress Notes (Signed)
A and O x3. Report to RN. Tolerated MAC anesthesia well.

## 2016-11-17 NOTE — Progress Notes (Signed)
Pt's states no medical or surgical changes since previsit or office visit. 

## 2016-11-17 NOTE — Patient Instructions (Signed)
Impression/recommendations:  Normal exam  YOU HAD AN ENDOSCOPIC PROCEDURE TODAY AT [facility]:   Refer to the procedure report that was given to you for any specific questions about what was found during the examination.  If the procedure report does not answer your questions, please call your gastroenterologist to clarify.  If you requested that your care partner not be given the details of your procedure findings, then the procedure report has been included in a sealed envelope for you to review at your convenience later.  YOU SHOULD EXPECT: Some feelings of bloating in the abdomen. Passage of more gas than usual.  Walking can help get rid of the air that was put into your GI tract during the procedure and reduce the bloating. If you had a lower endoscopy (such as a colonoscopy or flexible sigmoidoscopy) you may notice spotting of blood in your stool or on the toilet paper. If you underwent a bowel prep for your procedure, you may not have a normal bowel movement for a few days.  Please Note:  You might notice some irritation and congestion in your nose or some drainage.  This is from the oxygen used during your procedure.  There is no need for concern and it should clear up in a day or so.  SYMPTOMS TO REPORT IMMEDIATELY:   Following lower endoscopy (colonoscopy or flexible sigmoidoscopy):  Excessive amounts of blood in the stool  Significant tenderness or worsening of abdominal pains  Swelling of the abdomen that is new, acute  Fever of 100F or higher   For urgent or emergent issues, a gastroenterologist can be reached at any hour by calling [phone].   DIET:  We do recommend a small meal at first, but then you may proceed to your regular diet.  Drink plenty of fluids but you should avoid alcoholic beverages for 24 hours.  ACTIVITY:  You should plan to take it easy for the rest of today and you should NOT DRIVE or use heavy machinery until tomorrow (because of  the sedation medicines used during the test).    FOLLOW UP: Our staff will call the number listed on your records the next business day following your procedure to check on you and address any questions or concerns that you may have regarding the information given to you following your procedure. If we do not reach you, we will leave a message.  However, if you are feeling well and you are not experiencing any problems, there is no need to return our call.  We will assume that you have returned to your regular daily activities without incident.  If any biopsies were taken you will be contacted by phone or by letter within the next 1-3 weeks.  Please call us at [phone] if you have not heard about the biopsies in 3 weeks.    SIGNATURES/CONFIDENTIALITY: You and/or your care partner have signed paperwork which will be entered into your electronic medical record.  These signatures attest to the fact that that the information above on your After Visit Summary has been reviewed and is understood.  Full responsibility of the confidentiality of this discharge information lies with you and/or your care-partner.

## 2016-11-18 ENCOUNTER — Telehealth: Payer: Self-pay

## 2016-11-18 NOTE — Telephone Encounter (Signed)
  Follow up Call-  Call back number 11/17/2016  Post procedure Call Back phone  # [phone]  Permission to leave phone message Yes  Some recent data might be hidden     Patient questions:  Do you have a fever, pain , or abdominal swelling? No. Pain Score  0 *  Have you tolerated food without any problems? Yes.    Have you been able to return to your normal activities? Yes.    Do you have any questions about your discharge instructions: Diet   No. Medications  No. Follow up visit  No.  Do you have questions or concerns about your Care? No.  Actions: * If pain score is 4 or above: No action needed, pain <4.  No problems noted per pt. maw

## 2016-11-22 ENCOUNTER — Encounter: Payer: BC Managed Care – PPO | Admitting: Gynecology

## 2016-11-24 ENCOUNTER — Ambulatory Visit (INDEPENDENT_AMBULATORY_CARE_PROVIDER_SITE_OTHER): Payer: 59 | Admitting: Obstetrics & Gynecology

## 2016-11-24 ENCOUNTER — Encounter: Payer: Self-pay | Admitting: Obstetrics & Gynecology

## 2016-11-24 VITALS — BP 130/90 | Ht 63.75 in | Wt 175.0 lb

## 2016-11-24 DIAGNOSIS — Z01419 Encounter for gynecological examination (general) (routine) without abnormal findings: Secondary | ICD-10-CM | POA: Diagnosis not present

## 2016-11-24 DIAGNOSIS — Z23 Encounter for immunization: Secondary | ICD-10-CM

## 2016-11-24 DIAGNOSIS — Z975 Presence of (intrauterine) contraceptive device: Secondary | ICD-10-CM

## 2016-11-24 NOTE — Progress Notes (Signed)
Maria Frank May 04, 1975 [id number]   History:    41 y.o. G0  Married  RP:  Established patient presenting for annual gyn exam  HPI:  Well on Paragard IUD x 11/2012.   Had Abdominal pain/Diarrhea probably secondary to IBS, CT scan of Abdomen/Pelvis 10/26/2016 showed the IUD a little low in uterus, located in body and lower uterine segment, absent from fundus, and with the Rt arm deep in uterus.  No pelvic pain.  Menses regular normal every month.  Not sexually active x 7 yrs, but her and husband are now good with that.   Breasts wnl.  Past medical history,surgical history, family history and social history were all reviewed and documented in the EPIC chart.  Gynecologic History Patient's last menstrual period was 11/11/2016. Contraception: IUD Paragard 2014 Last Pap: 12/2015. Results were: normal Last mammogram: 2017. Results were: normal  Obstetric History OB History  Gravida Para Term Preterm AB Living  0 0 0 0 0 0  SAB TAB Ectopic Multiple Live Births  0 0 0 0           ROS: A ROS was performed and pertinent positives and negatives are included in the history.  GENERAL: No fevers or chills. HEENT: No change in vision, no earache, sore throat or sinus congestion. NECK: No pain or stiffness. CARDIOVASCULAR: No chest pain or pressure. No palpitations. PULMONARY: No shortness of breath, cough or wheeze. GASTROINTESTINAL: No abdominal pain, nausea, vomiting or diarrhea, melena or bright red blood per rectum. GENITOURINARY: No urinary frequency, urgency, hesitancy or dysuria. MUSCULOSKELETAL: No joint or muscle pain, no back pain, no recent trauma. DERMATOLOGIC: No rash, no itching, no lesions. ENDOCRINE: No polyuria, polydipsia, no heat or cold intolerance. No recent change in weight. HEMATOLOGICAL: No anemia or easy bruising or bleeding. NEUROLOGIC: No headache, seizures, numbness, tingling or weakness. PSYCHIATRIC: No depression, no loss of interest in normal activity or change in  sleep pattern.     Exam:   BP 130/90   Ht 5' 3.75" (1.619 m)   Wt 175 lb (79.4 kg)   LMP 11/11/2016 Comment: MIRENA  BMI 30.27 kg/m   Body mass index is 30.27 kg/m.  General appearance : Well developed well nourished female. No acute distress HEENT: Eyes: no retinal hemorrhage or exudates,  Neck supple, trachea midline, no carotid bruits, no thyroidmegaly Lungs: Clear to auscultation, no rhonchi or wheezes, or rib retractions  Heart: Regular rate and rhythm, no murmurs or gallops Breast:Examined in sitting and supine position were symmetrical in appearance, no palpable masses or tenderness,  no skin retraction, no nipple inversion, no nipple discharge, no skin discoloration, no axillary or supraclavicular lymphadenopathy Abdomen: no palpable masses or tenderness, no rebound or guarding Extremities: no edema or skin discoloration or tenderness  Pelvic: Vulva normal  Bartholin, Urethra, Skene Glands: Within normal limits             Vagina: No gross lesions or discharge  Cervix: No gross lesions or discharge.  IUD strings visible.  Uterus  AV, normal size, shape and consistency, non-tender and mobile  Adnexa  Without masses or tenderness  Anus and perineum  normal    Assessment/Plan:  41 y.o. female for annual exam   1. Well female exam with routine gynecological exam Normal gyn exam.  Pap normal 2017, will repeat next year.  Breasts wnl.  Will schedule screening mammo 12/2016.  2. IUD contraception Paragard IUD x 2014, well tolerated.  Strings visible.  Per CT scan 10/2016,  may be too low and Rt arm embedded in uterus.  Will f/u to evaluate with Pelvic US.  -Pelvic US  3. Flu vaccine need  - Flu Vaccine QUAD 36+ mos IM (Fluarix, Quad PF)  [name] MD, 8:26 AM 11/24/2016

## 2016-11-24 NOTE — Patient Instructions (Signed)
1. Well female exam with routine gynecological exam Normal gyn exam.  Pap normal 2017, will repeat next year.  Breasts wnl.  Will schedule screening mammo 12/2016.  2. IUD contraception Paragard IUD x 2014, well tolerated.  Strings visible.  Per CT scan 10/2016, may be too low and Rt arm embedded in uterus.  Will f/u to evaluate with Pelvic US.  -Pelvic US  3. Flu vaccine need  - Flu Vaccine QUAD 36+ mos IM (Fluarix, Quad PF)  Anyelina, it was a pleasure meeting you today!  See you again soon for your Pelvic US.  Health Maintenance, Female Adopting a healthy lifestyle and getting preventive care can go a long way to promote health and wellness. Talk with your health care provider about what schedule of regular examinations is right for you. This is a good chance for you to check in with your provider about disease prevention and staying healthy. In between checkups, there are plenty of things you can do on your own. Experts have done a lot of research about which lifestyle changes and preventive measures are most likely to keep you healthy. Ask your health care provider for more information. Weight and diet Eat a healthy diet  Be sure to include plenty of vegetables, fruits, low-fat dairy products, and lean protein.  Do not eat a lot of foods high in solid fats, added sugars, or salt.  Get regular exercise. This is one of the most important things you can do for your health. ? Most adults should exercise for at least 150 minutes each week. The exercise should increase your heart rate and make you sweat (moderate-intensity exercise). ? Most adults should also do strengthening exercises at least twice a week. This is in addition to the moderate-intensity exercise.  Maintain a healthy weight  Body mass index (BMI) is a measurement that can be used to identify possible weight problems. It estimates body fat based on height and weight. Your health care provider can help determine your BMI and  help you achieve or maintain a healthy weight.  For females 49 years of age and older: ? A BMI below 18.5 is considered underweight. ? A BMI of 18.5 to 24.9 is normal. ? A BMI of 25 to 29.9 is considered overweight. ? A BMI of 30 and above is considered obese.  Watch levels of cholesterol and blood lipids  You should start having your blood tested for lipids and cholesterol at 42 years of age, then have this test every 5 years.  You may need to have your cholesterol levels checked more often if: ? Your lipid or cholesterol levels are high. ? You are older than 42 years of age. ? You are at high risk for heart disease.  Cancer screening Lung Cancer  Lung cancer screening is recommended for adults 21-56 years old who are at high risk for lung cancer because of a history of smoking.  A yearly low-dose CT scan of the lungs is recommended for people who: ? Currently smoke. ? Have quit within the past 15 years. ? Have at least a 30-pack-year history of smoking. A pack year is smoking an average of one pack of cigarettes a day for 1 year.  Yearly screening should continue until it has been 15 years since you quit.  Yearly screening should stop if you develop a health problem that would prevent you from having lung cancer treatment.  Breast Cancer  Practice breast self-awareness. This means understanding how your breasts normally appear  and feel.  It also means doing regular breast self-exams. Let your health care provider know about any changes, no matter how small.  If you are in your 20s or 30s, you should have a clinical breast exam (CBE) by a health care provider every 1-3 years as part of a regular health exam.  If you are 27 or older, have a CBE every year. Also consider having a breast X-ray (mammogram) every year.  If you have a family history of breast cancer, talk to your health care provider about genetic screening.  If you are at high risk for breast cancer, talk to  your health care provider about having an MRI and a mammogram every year.  Breast cancer gene (BRCA) assessment is recommended for women who have family members with BRCA-related cancers. BRCA-related cancers include: ? Breast. ? Ovarian. ? Tubal. ? Peritoneal cancers.  Results of the assessment will determine the need for genetic counseling and BRCA1 and BRCA2 testing.  Cervical Cancer Your health care provider may recommend that you be screened regularly for cancer of the pelvic organs (ovaries, uterus, and vagina). This screening involves a pelvic examination, including checking for microscopic changes to the surface of your cervix (Pap test). You may be encouraged to have this screening done every 3 years, beginning at age 39.  For women ages 10-65, health care providers may recommend pelvic exams and Pap testing every 3 years, or they may recommend the Pap and pelvic exam, combined with testing for human papilloma virus (HPV), every 5 years. Some types of HPV increase your risk of cervical cancer. Testing for HPV may also be done on women of any age with unclear Pap test results.  Other health care providers may not recommend any screening for nonpregnant women who are considered low risk for pelvic cancer and who do not have symptoms. Ask your health care provider if a screening pelvic exam is right for you.  If you have had past treatment for cervical cancer or a condition that could lead to cancer, you need Pap tests and screening for cancer for at least 20 years after your treatment. If Pap tests have been discontinued, your risk factors (such as having a new sexual partner) need to be reassessed to determine if screening should resume. Some women have medical problems that increase the chance of getting cervical cancer. In these cases, your health care provider may recommend more frequent screening and Pap tests.  Colorectal Cancer  This type of cancer can be detected and often  prevented.  Routine colorectal cancer screening usually begins at 42 years of age and continues through 42 years of age.  Your health care provider may recommend screening at an earlier age if you have risk factors for colon cancer.  Your health care provider may also recommend using home test kits to check for hidden blood in the stool.  A small camera at the end of a tube can be used to examine your colon directly (sigmoidoscopy or colonoscopy). This is done to check for the earliest forms of colorectal cancer.  Routine screening usually begins at age 20.  Direct examination of the colon should be repeated every 5-10 years through 42 years of age. However, you may need to be screened more often if early forms of precancerous polyps or small growths are found.  Skin Cancer  Check your skin from head to toe regularly.  Tell your health care provider about any new moles or changes in moles, especially if  there is a change in a mole's shape or color.  Also tell your health care provider if you have a mole that is larger than the size of a pencil eraser.  Always use sunscreen. Apply sunscreen liberally and repeatedly throughout the day.  Protect yourself by wearing long sleeves, pants, a wide-brimmed hat, and sunglasses whenever you are outside.  Heart disease, diabetes, and high blood pressure  High blood pressure causes heart disease and increases the risk of stroke. High blood pressure is more likely to develop in: ? People who have blood pressure in the high end of the normal range (130-139/85-89 mm Hg). ? People who are overweight or obese. ? People who are African American.  If you are 27-51 years of age, have your blood pressure checked every 3-5 years. If you are 18 years of age or older, have your blood pressure checked every year. You should have your blood pressure measured twice-once when you are at a hospital or clinic, and once when you are not at a hospital or clinic.  Record the average of the two measurements. To check your blood pressure when you are not at a hospital or clinic, you can use: ? An automated blood pressure machine at a pharmacy. ? A home blood pressure monitor.  If you are between 54 years and 4 years old, ask your health care provider if you should take aspirin to prevent strokes.  Have regular diabetes screenings. This involves taking a blood sample to check your fasting blood sugar level. ? If you are at a normal weight and have a low risk for diabetes, have this test once every three years after 42 years of age. ? If you are overweight and have a high risk for diabetes, consider being tested at a younger age or more often. Preventing infection Hepatitis B  If you have a higher risk for hepatitis B, you should be screened for this virus. You are considered at high risk for hepatitis B if: ? You were born in a country where hepatitis B is common. Ask your health care provider which countries are considered high risk. ? Your parents were born in a high-risk country, and you have not been immunized against hepatitis B (hepatitis B vaccine). ? You have HIV or AIDS. ? You use needles to inject street drugs. ? You live with someone who has hepatitis B. ? You have had sex with someone who has hepatitis B. ? You get hemodialysis treatment. ? You take certain medicines for conditions, including cancer, organ transplantation, and autoimmune conditions.  Hepatitis C  Blood testing is recommended for: ? Everyone born from [birth year] through 1966. ? Anyone with known risk factors for hepatitis C.  Sexually transmitted infections (STIs)  You should be screened for sexually transmitted infections (STIs) including gonorrhea and chlamydia if: ? You are sexually active and are younger than 42 years of age. ? You are older than 42 years of age and your health care provider tells you that you are at risk for this type of infection. ? Your sexual  activity has changed since you were last screened and you are at an increased risk for chlamydia or gonorrhea. Ask your health care provider if you are at risk.  If you do not have HIV, but are at risk, it may be recommended that you take a prescription medicine daily to prevent HIV infection. This is called pre-exposure prophylaxis (PrEP). You are considered at risk if: ? You are sexually active and  do not regularly use condoms or know the HIV status of your partner(s). ? You take drugs by injection. ? You are sexually active with a partner who has HIV.  Talk with your health care provider about whether you are at high risk of being infected with HIV. If you choose to begin PrEP, you should first be tested for HIV. You should then be tested every 3 months for as long as you are taking PrEP. Pregnancy  If you are premenopausal and you may become pregnant, ask your health care provider about preconception counseling.  If you may become pregnant, take 400 to 800 micrograms (mcg) of folic acid every day.  If you want to prevent pregnancy, talk to your health care provider about birth control (contraception). Osteoporosis and menopause  Osteoporosis is a disease in which the bones lose minerals and strength with aging. This can result in serious bone fractures. Your risk for osteoporosis can be identified using a bone density scan.  If you are 60 years of age or older, or if you are at risk for osteoporosis and fractures, ask your health care provider if you should be screened.  Ask your health care provider whether you should take a calcium or vitamin D supplement to lower your risk for osteoporosis.  Menopause may have certain physical symptoms and risks.  Hormone replacement therapy may reduce some of these symptoms and risks. Talk to your health care provider about whether hormone replacement therapy is right for you. Follow these instructions at home:  Schedule regular health, dental,  and eye exams.  Stay current with your immunizations.  Do not use any tobacco products including cigarettes, chewing tobacco, or electronic cigarettes.  If you are pregnant, do not drink alcohol.  If you are breastfeeding, limit how much and how often you drink alcohol.  Limit alcohol intake to no more than 1 drink per day for nonpregnant women. One drink equals 12 ounces of beer, 5 ounces of wine, or 1 ounces of hard liquor.  Do not use street drugs.  Do not share needles.  Ask your health care provider for help if you need support or information about quitting drugs.  Tell your health care provider if you often feel depressed.  Tell your health care provider if you have ever been abused or do not feel safe at home. This information is not intended to replace advice given to you by your health care provider. Make sure you discuss any questions you have with your health care provider. Document Released: 08/03/2010 Document Revised: 06/26/2015 Document Reviewed: 10/22/2014 Elsevier Interactive Patient Education  [employer].

## 2016-11-29 ENCOUNTER — Other Ambulatory Visit: Payer: Self-pay

## 2016-11-29 MED ORDER — RIFAXIMIN 550 MG PO TABS: 550.0000 mg | ORAL_TABLET | Freq: Three times a day (TID) | ORAL | 0 refills | Status: DC

## 2016-12-16 ENCOUNTER — Ambulatory Visit (INDEPENDENT_AMBULATORY_CARE_PROVIDER_SITE_OTHER): Payer: 59

## 2016-12-16 ENCOUNTER — Other Ambulatory Visit: Payer: Self-pay | Admitting: Obstetrics & Gynecology

## 2016-12-16 ENCOUNTER — Ambulatory Visit (INDEPENDENT_AMBULATORY_CARE_PROVIDER_SITE_OTHER): Payer: 59 | Admitting: Obstetrics & Gynecology

## 2016-12-16 DIAGNOSIS — Z975 Presence of (intrauterine) contraceptive device: Secondary | ICD-10-CM | POA: Diagnosis not present

## 2016-12-16 DIAGNOSIS — T8332XA Displacement of intrauterine contraceptive device, initial encounter: Secondary | ICD-10-CM | POA: Diagnosis not present

## 2016-12-16 DIAGNOSIS — Z30431 Encounter for routine checking of intrauterine contraceptive device: Secondary | ICD-10-CM | POA: Diagnosis not present

## 2016-12-16 NOTE — Progress Notes (Signed)
    Maria Frank 07/25/1975 [id number]        41 y.o.  G0  RP:  Paragard IUD location by Pelvic US  HPI:  Paragard IUD inserted 12/01/2012.  No pelvic pain.  No dyspareunia.  Menses q24-28 days.  Seen by me for her Annual Gyn exam on 11/24/2016.  Past medical history,surgical history, problem list, medications, allergies, family history and social history were all reviewed and documented in the EPIC chart.  Directed ROS with pertinent positives and negatives documented in the history of present illness/assessment and plan.  Exam:  There were no vitals filed for this visit. General appearance:  Normal  Pelvic US today: The uterus anteverted and homogeneous measuring 9.91 x 5.14 x 3.75 cm.  Endometrial lining normal try layered measuring 9.7 mm.  IUD located in the lower uterine segment and extends from the cervix to the body of the uterus.  Right and left ovary normal.  No apparent mass in the right and left adnexa.  No free fluid in posterior cul-de-sac.   Assessment/Plan:  41 y.o. G0  1. IUD check up Reassured.  ParaGard IUD in good intrauterine location, completely in the uterus and not migrating in the myometrium.  Positioned in the lower part of the uterus, but still extending all the way to the body of the uterus, probably simply because the overall intrauterine cavity is relatively  Large.  At last visit the IUD strings were visible at the cervix with no IUD part otherwise visible.  Decision therefore taken to observe.  Patient will call back if develops pelvic pain or abnormal bleeding.  Counseling on above issues >50% x 15 minutes.  [name] MD, 3:10 PM 12/16/2016

## 2016-12-19 ENCOUNTER — Encounter: Payer: Self-pay | Admitting: Obstetrics & Gynecology

## 2016-12-19 NOTE — Patient Instructions (Signed)
1. IUD check up Reassured.  ParaGard IUD in good intrauterine location, completely in the uterus and not migrating in the myometrium.  Positioned in the lower part of the uterus, but still extending all the way to the body of the uterus, probably simply because the overall intrauterine cavity is relatively  Large.  At last visit the IUD strings were visible at the cervix with no IUD part otherwise visible.  Decision therefore taken to observe.  Patient will call back if develops pelvic pain or abnormal bleeding.   Estill Bamberg, good seeing you today!

## 2017-02-07 ENCOUNTER — Ambulatory Visit (INDEPENDENT_AMBULATORY_CARE_PROVIDER_SITE_OTHER): Payer: 59 | Admitting: Internal Medicine

## 2017-02-07 ENCOUNTER — Encounter: Payer: Self-pay | Admitting: Internal Medicine

## 2017-02-07 VITALS — BP 110/70 | HR 62 | Ht 63.75 in | Wt 185.1 lb

## 2017-02-07 DIAGNOSIS — K589 Irritable bowel syndrome without diarrhea: Secondary | ICD-10-CM

## 2017-02-07 MED ORDER — DICYCLOMINE HCL 10 MG PO CAPS: 20.0000 mg | ORAL_CAPSULE | Freq: Four times a day (QID) | ORAL | 3 refills | Status: DC | PRN

## 2017-02-07 NOTE — Patient Instructions (Signed)
We have sent the following medications to your pharmacy for you to pick up at your convenience: Bentyl  Please follow up with Dr Hilarie Fredrickson in 2 years.  If you are age 43 or older, your body mass index should be between 23-30. Your Body mass index is 32.03 kg/m. If this is out of the aforementioned range listed, please consider follow up with your Primary Care Provider.  If you are age 50 or younger, your body mass index should be between 19-25. Your Body mass index is 32.03 kg/m. If this is out of the aformentioned range listed, please consider follow up with your Primary Care Provider.

## 2017-02-07 NOTE — Progress Notes (Signed)
   Subjective:    Patient ID: Maria Frank, female    DOB: [birth year], 42 y.o.   MRN: [id number]  HPI Maria Frank is a 42 year old female with a history of IBS, probable  infectious colitis in September 2018 who is here for follow-up.  She is here alone today.  She was seen by Ellouise Newer, PA-C in September and October 2018 when she developed somewhat acute onset abdominal pain associated with diarrhea and blood in her stool.  She had an abnormal CT scan in late September 2018 showing wall thickening of the distal transverse colon.  Stool studies were negative but she was treated empirically with oral antibiotics.  Thereafter she came for a colonoscopy which was performed on 11/17/2016.  This was to the terminal ileum with a good bowel preparation.  The entire colon was normal and random biopsies were also normal thereafter she was treated with rifaximin 550 mg 3 times daily times 14 days.  She reports with the rifaximin her bowel habits have returned to regular.  She has not had any further diarrhea, blood in her stool or constipation.  She reports having IBS dating back to when she was in high school and college.  This did not bother her when she lived abroad in [place] but came back when she moved back to [place].  She is still using Bentyl 10 mg once daily to prevent lower abdominal crampy discomfort which is her usual IBS symptom.  With this she denies lower abdominal cramping.  Currently she is having 2-3 bowel movements per day like "clockwork".  She denies upper GI and hepatobiliary complaint.   Review of Systems As per HPI, otherwise negative  Current Medications, Allergies, Past Medical History, Past Surgical History, Family History and Social History were reviewed in [employer] record.     Objective:   Physical Exam BP 110/70   Pulse 62   Ht 5' 3.75" (1.619 m)   Wt 185 lb 2 oz (84 kg)   BMI 32.03 kg/m  Constitutional: Well-developed and  well-nourished. No distress. HEENT: Normocephalic and atraumatic. Conjunctivae are normal.  No scleral icterus. Neck: Neck supple. Trachea midline. Cardiovascular: Normal rate, regular rhythm and intact distal pulses. No M/R/G Pulmonary/chest: Effort normal and breath sounds normal. No wheezing, rales or rhonchi. Abdominal: Soft, nontender, nondistended. Bowel sounds active throughout. There are no masses palpable. No hepatosplenomegaly. Extremities: no clubbing, cyanosis, or edema Neurological: Alert and oriented to person place and time. Skin: Skin is warm and dry. Psychiatric: Normal mood and affect. Behavior is normal.      Assessment & Plan:  42 year old female with a history of IBS, probable  infectious colitis in September 2018 who is here for follow-up.  1.  Resolved infectious colitis with postinfectious exacerbation of IBS/history of IBS --she is doing well and her infectious colitis resolved.  She had a normal colonoscopy in October 2018 which is very reassuring.  No further complaints today.  She is using Bentyl 10 mg daily which helps with lower abdominal crampy discomfort.  She can continue this daily and/or on an as-needed basis.  If she is needing this medication for IBS I recommended she follow-up in 2 years for continuity otherwise she can be seen as needed.  2.  CRC screening --repeat colonoscopy recommended in October 2028 for screening (after normal colonoscopy in October 2018)  15 minutes spent with the patient today. Greater than 50% was spent in counseling and coordination of care with the patient

## 2017-04-13 ENCOUNTER — Other Ambulatory Visit (INDEPENDENT_AMBULATORY_CARE_PROVIDER_SITE_OTHER): Payer: 59

## 2017-04-13 ENCOUNTER — Encounter: Payer: Self-pay | Admitting: Internal Medicine

## 2017-04-13 ENCOUNTER — Ambulatory Visit (INDEPENDENT_AMBULATORY_CARE_PROVIDER_SITE_OTHER): Payer: 59 | Admitting: Internal Medicine

## 2017-04-13 VITALS — BP 124/82 | HR 98 | Temp 98.7°F | Resp 16 | Wt 183.0 lb

## 2017-04-13 DIAGNOSIS — R51 Headache: Secondary | ICD-10-CM | POA: Diagnosis not present

## 2017-04-13 DIAGNOSIS — R5383 Other fatigue: Secondary | ICD-10-CM | POA: Insufficient documentation

## 2017-04-13 DIAGNOSIS — J028 Acute pharyngitis due to other specified organisms: Secondary | ICD-10-CM

## 2017-04-13 DIAGNOSIS — R519 Headache, unspecified: Secondary | ICD-10-CM | POA: Insufficient documentation

## 2017-04-13 DIAGNOSIS — J029 Acute pharyngitis, unspecified: Secondary | ICD-10-CM

## 2017-04-13 DIAGNOSIS — B9789 Other viral agents as the cause of diseases classified elsewhere: Secondary | ICD-10-CM

## 2017-04-13 LAB — COMPREHENSIVE METABOLIC PANEL
ALBUMIN: 4 g/dL (ref 3.5–5.2)
ALK PHOS: 48 U/L (ref 39–117)
ALT: 16 U/L (ref 0–35)
AST: 15 U/L (ref 0–37)
BILIRUBIN TOTAL: 0.4 mg/dL (ref 0.2–1.2)
BUN: 10 mg/dL (ref 6–23)
CO2: 24 mEq/L (ref 19–32)
Calcium: 9.3 mg/dL (ref 8.4–10.5)
Chloride: 107 mEq/L (ref 96–112)
Creatinine, Ser: 0.66 mg/dL (ref 0.40–1.20)
GFR: 103.9 mL/min (ref >60.00)
GLUCOSE: 91 mg/dL (ref 70–99)
Potassium: 3.8 mEq/L (ref 3.5–5.1)
SODIUM: 138 meq/L (ref 135–145)
TOTAL PROTEIN: 7.1 g/dL (ref 6.0–8.3)

## 2017-04-13 LAB — TSH: TSH: 1.44 u[IU]/mL (ref 0.35–4.50)

## 2017-04-13 LAB — CBC WITH DIFFERENTIAL/PLATELET
BASOS ABS: 0 10*3/uL (ref 0.0–0.1)
Basophils Relative: 0.3 % (ref 0.0–3.0)
Eosinophils Absolute: 0 10*3/uL (ref 0.0–0.7)
Eosinophils Relative: 0.4 % (ref 0.0–5.0)
HCT: 38.4 % (ref 36.0–46.0)
Hemoglobin: 13 g/dL (ref 12.0–15.0)
LYMPHS ABS: 2.5 10*3/uL (ref 0.7–4.0)
Lymphocytes Relative: 30.6 % (ref 12.0–46.0)
MCHC: 33.9 g/dL (ref 30.0–36.0)
MCV: 92.8 fl (ref 78.0–100.0)
MONO ABS: 0.7 10*3/uL (ref 0.1–1.0)
Monocytes Relative: 9.1 % (ref 3.0–12.0)
NEUTROS PCT: 59.6 % (ref 43.0–77.0)
Neutro Abs: 4.8 10*3/uL (ref 1.4–7.7)
Platelets: 216 10*3/uL (ref 150.0–400.0)
RBC: 4.14 Mil/uL (ref 3.87–5.11)
RDW: 13.2 % (ref 11.5–15.5)
WBC: 8 10*3/uL (ref 4.0–10.5)

## 2017-04-13 LAB — MONONUCLEOSIS SCREEN: MONO SCREEN: NEGATIVE

## 2017-04-13 MED ORDER — CETIRIZINE HCL 10 MG PO TABS: 10.0000 mg | ORAL_TABLET | Freq: Every day | ORAL | 11 refills | Status: DC

## 2017-04-13 NOTE — Assessment & Plan Note (Signed)
Possibly related to allergies She may also have a viral infection such as mono given that her coworker has not Continue allergy medications Continue increased rest Will check basic blood work including Monospot, CBC, CMP, TSH given her other symptoms

## 2017-04-13 NOTE — Progress Notes (Signed)
Subjective:    Patient ID: Maria Frank, female    DOB: 1975/09/16, 42 y.o.   MRN: [id number]  HPI She is here for an acute visit for cold symptoms.  Her symptoms started a few weeks ago  Her co-worker has mono - diagnosed last week.   She is experiencing fatigue, headaches and feeling cold/chills.  She has had some nasal congestion, left-sided ear pain, sinus pain/pressure and sore throat that has improved.  She does have allergies and assumed that some of the symptoms were related to her allergies.  It was not until her coworker told her which she had that she wondered if there is something else going on in addition to the allergies..    She has taken zyrtec, nasonex, tylenol.   Medications and allergies reviewed with patient and updated if appropriate.  Patient Active Problem List   Diagnosis Date Noted  . Bloody diarrhea 10/25/2016  . Generalized abdominal pain 10/25/2016  . Upper back pain on left side 07/01/2015  . Onychomycosis 03/04/2015  . Athlete's foot 03/04/2015  . Renal cyst 03/04/2015  . Osteopenia 03/04/2015  . Insomnia 03/04/2015  . IUD (intrauterine device) in place 12/01/2012  . Mild mitral regurgitation 07/03/2010  . HYPERLIPIDEMIA 03/30/2010  . Mild depression ([place]) 03/30/2010  . ALLERGIC RHINITIS 03/30/2010  . ARTHRITIS 03/30/2010  . HEADACHE 03/30/2010    Current Outpatient Medications on File Prior to Visit  Medication Sig Dispense Refill  . ALPRAZolam (XANAX) 0.5 MG tablet Take 0.25 mg by mouth at bedtime as needed for anxiety.     . dicyclomine (BENTYL) 10 MG capsule Take 2 capsules (20 mg total) by mouth 4 (four) times daily as needed for spasms. 90 capsule 3  . EPIPEN 2-PAK 0.3 MG/0.3ML SOAJ injection Inject 0.3 mLs (0.3 mg total) into the skin once as needed. 1 Device   . mometasone (NASONEX) 50 MCG/ACT nasal spray 2 sprays by Nasal route daily.      . Multiple Vitamin (MULTIVITAMIN) tablet Take 1 tablet by mouth daily.    Marland Kitchen PAZEO 0.7 % SOLN  PLACE 1 DROP INTO BOTH EYES EVERY DAY  6   No current facility-administered medications on file prior to visit.     Past Medical History:  Diagnosis Date  . ALLERGIC RHINITIS   . ARTHRITIS   . CHICKENPOX, HX OF   . DEPRESSION   . Heart murmur   . HYPERLIPIDEMIA   . IBS (irritable bowel syndrome)   . Migraine     Past Surgical History:  Procedure Laterality Date  . COLONOSCOPY  11/2016  . NO PAST SURGERIES      Social History   Socioeconomic History  . Marital status: Married    Spouse name: None  . Number of children: None  . Years of education: None  . Highest education level: None  Social Needs  . Financial resource strain: None  . Food insecurity - worry: None  . Food insecurity - inability: None  . Transportation needs - medical: None  . Transportation needs - non-medical: None  Occupational History  . None  Tobacco Use  . Smoking status: Never Smoker  . Smokeless tobacco: Never Used  Substance and Sexual Activity  . Alcohol use: Yes    Alcohol/week: 0.0 oz    Comment: Social  . Drug use: No  . Sexual activity: Yes    Partners: Male    Birth control/protection: None, IUD  Other Topics Concern  . None  Social History Narrative  Married since 2004, but separated 10/2012 from spouse. Originally from [place], in [place] since 2008. 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 Hypertension Father   . Cardiomyopathy Father   . Sleep apnea Father   . Cancer Mother        pancreatic, liver and renal   . Osteoporosis Mother   . Renal cancer Mother   . Hypertension Paternal Grandfather   . Arthritis Other        parent & grandparents  . Breast cancer Maternal Aunt 60  . Breast cancer Cousin 30  . Asthma Sister   . Sleep apnea Brother   . Asthma Sister   . Sleep apnea Brother   . Stomach cancer Neg Hx   . Rectal cancer Neg Hx   . Esophageal cancer Neg Hx   . Colon cancer Neg Hx     Review of Systems    Constitutional: Positive for chills and fatigue. Negative for appetite change and fever.  HENT: Positive for congestion, ear pain (left), sinus pressure, sinus pain and sore throat (mild, improved - there for while).   Respiratory: Negative for cough, shortness of breath and wheezing.   Gastrointestinal: Negative for abdominal pain, diarrhea and nausea.  Endocrine: Positive for cold intolerance.  Genitourinary: Negative for dysuria and hematuria.  Musculoskeletal: Negative for myalgias.  Neurological: Positive for headaches. Negative for dizziness and light-headedness.       Objective:   Vitals:   04/13/17 1100  BP: 124/82  Pulse: 98  Resp: 16  Temp: 98.7 F (37.1 C)  SpO2: 98%   Filed Weights   04/13/17 1100  Weight: 183 lb (83 kg)   Body mass index is 31.66 kg/m.  Wt Readings from Last 3 Encounters:  04/13/17 183 lb (83 kg)  02/07/17 185 lb 2 oz (84 kg)  11/24/16 175 lb (79.4 kg)     Physical Exam GENERAL APPEARANCE: Appears stated age, well appearing, NAD EYES: conjunctiva clear, no icterus HEENT: bilateral tympanic membranes and ear canals normal, oropharynx with no erythema, no thyromegaly, trachea midline, no cervical or supraclavicular lymphadenopathy LUNGS: Clear to auscultation without wheeze or crackles, unlabored breathing, good air entry bilaterally CARDIOVASCULAR: Normal S1,S2 without murmurs, no edema Abd: soft, ND, NT, no HSM SKIN: warm, dry        Assessment & Plan:   See Problem List for Assessment and Plan of chronic medical problems.

## 2017-04-13 NOTE — Assessment & Plan Note (Signed)
Possibly related to allergies She may also have a viral infection such as mono given that her coworker has not Continue allergy medications Will check basic blood work including Monospot, CBC, CMP, TSH given her other symptoms

## 2017-04-13 NOTE — Patient Instructions (Addendum)
  Test(s) ordered today. Your results will be released to MyChart (or called to you) after review, usually within 72hours after test completion. If any changes need to be made, you will be notified at that same time.    Medications reviewed and updated.  No changes recommended at this time.     

## 2017-05-03 DIAGNOSIS — J3081 Allergic rhinitis due to animal (cat) (dog) hair and dander: Secondary | ICD-10-CM | POA: Diagnosis not present

## 2017-05-03 DIAGNOSIS — J301 Allergic rhinitis due to pollen: Secondary | ICD-10-CM | POA: Diagnosis not present

## 2017-05-03 DIAGNOSIS — J3089 Other allergic rhinitis: Secondary | ICD-10-CM | POA: Diagnosis not present

## 2017-05-05 DIAGNOSIS — J301 Allergic rhinitis due to pollen: Secondary | ICD-10-CM | POA: Diagnosis not present

## 2017-05-05 DIAGNOSIS — J3089 Other allergic rhinitis: Secondary | ICD-10-CM | POA: Diagnosis not present

## 2017-05-05 DIAGNOSIS — J3081 Allergic rhinitis due to animal (cat) (dog) hair and dander: Secondary | ICD-10-CM | POA: Diagnosis not present

## 2017-05-19 DIAGNOSIS — J301 Allergic rhinitis due to pollen: Secondary | ICD-10-CM | POA: Diagnosis not present

## 2017-05-19 DIAGNOSIS — J3089 Other allergic rhinitis: Secondary | ICD-10-CM | POA: Diagnosis not present

## 2017-05-19 DIAGNOSIS — J3081 Allergic rhinitis due to animal (cat) (dog) hair and dander: Secondary | ICD-10-CM | POA: Diagnosis not present

## 2017-05-30 DIAGNOSIS — J3089 Other allergic rhinitis: Secondary | ICD-10-CM | POA: Diagnosis not present

## 2017-05-30 DIAGNOSIS — J3081 Allergic rhinitis due to animal (cat) (dog) hair and dander: Secondary | ICD-10-CM | POA: Diagnosis not present

## 2017-05-30 DIAGNOSIS — J301 Allergic rhinitis due to pollen: Secondary | ICD-10-CM | POA: Diagnosis not present

## 2017-06-06 DIAGNOSIS — J301 Allergic rhinitis due to pollen: Secondary | ICD-10-CM | POA: Diagnosis not present

## 2017-06-06 DIAGNOSIS — J3089 Other allergic rhinitis: Secondary | ICD-10-CM | POA: Diagnosis not present

## 2017-06-06 DIAGNOSIS — J3081 Allergic rhinitis due to animal (cat) (dog) hair and dander: Secondary | ICD-10-CM | POA: Diagnosis not present

## 2017-06-14 DIAGNOSIS — J3081 Allergic rhinitis due to animal (cat) (dog) hair and dander: Secondary | ICD-10-CM | POA: Diagnosis not present

## 2017-06-14 DIAGNOSIS — J301 Allergic rhinitis due to pollen: Secondary | ICD-10-CM | POA: Diagnosis not present

## 2017-06-14 DIAGNOSIS — J3089 Other allergic rhinitis: Secondary | ICD-10-CM | POA: Diagnosis not present

## 2017-06-16 DIAGNOSIS — J3081 Allergic rhinitis due to animal (cat) (dog) hair and dander: Secondary | ICD-10-CM | POA: Diagnosis not present

## 2017-06-16 DIAGNOSIS — J3089 Other allergic rhinitis: Secondary | ICD-10-CM | POA: Diagnosis not present

## 2017-06-16 DIAGNOSIS — J301 Allergic rhinitis due to pollen: Secondary | ICD-10-CM | POA: Diagnosis not present

## 2017-06-28 DIAGNOSIS — J3089 Other allergic rhinitis: Secondary | ICD-10-CM | POA: Diagnosis not present

## 2017-06-28 DIAGNOSIS — J301 Allergic rhinitis due to pollen: Secondary | ICD-10-CM | POA: Diagnosis not present

## 2017-06-28 DIAGNOSIS — J3081 Allergic rhinitis due to animal (cat) (dog) hair and dander: Secondary | ICD-10-CM | POA: Diagnosis not present

## 2017-07-28 DIAGNOSIS — J3089 Other allergic rhinitis: Secondary | ICD-10-CM | POA: Diagnosis not present

## 2017-07-28 DIAGNOSIS — J301 Allergic rhinitis due to pollen: Secondary | ICD-10-CM | POA: Diagnosis not present

## 2017-07-28 DIAGNOSIS — H1045 Other chronic allergic conjunctivitis: Secondary | ICD-10-CM | POA: Diagnosis not present

## 2017-07-28 DIAGNOSIS — J3081 Allergic rhinitis due to animal (cat) (dog) hair and dander: Secondary | ICD-10-CM | POA: Diagnosis not present

## 2017-08-01 DIAGNOSIS — J3081 Allergic rhinitis due to animal (cat) (dog) hair and dander: Secondary | ICD-10-CM | POA: Diagnosis not present

## 2017-08-01 DIAGNOSIS — J301 Allergic rhinitis due to pollen: Secondary | ICD-10-CM | POA: Diagnosis not present

## 2017-08-01 DIAGNOSIS — J3089 Other allergic rhinitis: Secondary | ICD-10-CM | POA: Diagnosis not present

## 2017-08-03 DIAGNOSIS — J3089 Other allergic rhinitis: Secondary | ICD-10-CM | POA: Diagnosis not present

## 2017-08-03 DIAGNOSIS — J301 Allergic rhinitis due to pollen: Secondary | ICD-10-CM | POA: Diagnosis not present

## 2017-08-03 DIAGNOSIS — J3081 Allergic rhinitis due to animal (cat) (dog) hair and dander: Secondary | ICD-10-CM | POA: Diagnosis not present

## 2017-08-09 DIAGNOSIS — J3089 Other allergic rhinitis: Secondary | ICD-10-CM | POA: Diagnosis not present

## 2017-08-09 DIAGNOSIS — J301 Allergic rhinitis due to pollen: Secondary | ICD-10-CM | POA: Diagnosis not present

## 2017-08-09 DIAGNOSIS — J3081 Allergic rhinitis due to animal (cat) (dog) hair and dander: Secondary | ICD-10-CM | POA: Diagnosis not present

## 2017-08-12 DIAGNOSIS — J3081 Allergic rhinitis due to animal (cat) (dog) hair and dander: Secondary | ICD-10-CM | POA: Diagnosis not present

## 2017-08-12 DIAGNOSIS — J301 Allergic rhinitis due to pollen: Secondary | ICD-10-CM | POA: Diagnosis not present

## 2017-08-12 DIAGNOSIS — J3089 Other allergic rhinitis: Secondary | ICD-10-CM | POA: Diagnosis not present

## 2017-08-18 DIAGNOSIS — J3081 Allergic rhinitis due to animal (cat) (dog) hair and dander: Secondary | ICD-10-CM | POA: Diagnosis not present

## 2017-08-18 DIAGNOSIS — J3089 Other allergic rhinitis: Secondary | ICD-10-CM | POA: Diagnosis not present

## 2017-08-18 DIAGNOSIS — J301 Allergic rhinitis due to pollen: Secondary | ICD-10-CM | POA: Diagnosis not present

## 2017-08-30 DIAGNOSIS — J3089 Other allergic rhinitis: Secondary | ICD-10-CM | POA: Diagnosis not present

## 2017-08-30 DIAGNOSIS — J301 Allergic rhinitis due to pollen: Secondary | ICD-10-CM | POA: Diagnosis not present

## 2017-08-30 DIAGNOSIS — J3081 Allergic rhinitis due to animal (cat) (dog) hair and dander: Secondary | ICD-10-CM | POA: Diagnosis not present

## 2017-09-09 DIAGNOSIS — J3081 Allergic rhinitis due to animal (cat) (dog) hair and dander: Secondary | ICD-10-CM | POA: Diagnosis not present

## 2017-09-09 DIAGNOSIS — J301 Allergic rhinitis due to pollen: Secondary | ICD-10-CM | POA: Diagnosis not present

## 2017-09-09 DIAGNOSIS — J3089 Other allergic rhinitis: Secondary | ICD-10-CM | POA: Diagnosis not present

## 2017-10-14 DIAGNOSIS — J301 Allergic rhinitis due to pollen: Secondary | ICD-10-CM | POA: Diagnosis not present

## 2017-10-14 DIAGNOSIS — J3081 Allergic rhinitis due to animal (cat) (dog) hair and dander: Secondary | ICD-10-CM | POA: Diagnosis not present

## 2017-10-14 DIAGNOSIS — J3089 Other allergic rhinitis: Secondary | ICD-10-CM | POA: Diagnosis not present

## 2017-10-18 DIAGNOSIS — J3089 Other allergic rhinitis: Secondary | ICD-10-CM | POA: Diagnosis not present

## 2017-10-18 DIAGNOSIS — J3081 Allergic rhinitis due to animal (cat) (dog) hair and dander: Secondary | ICD-10-CM | POA: Diagnosis not present

## 2017-10-18 DIAGNOSIS — J301 Allergic rhinitis due to pollen: Secondary | ICD-10-CM | POA: Diagnosis not present

## 2017-10-20 DIAGNOSIS — J301 Allergic rhinitis due to pollen: Secondary | ICD-10-CM | POA: Diagnosis not present

## 2017-10-20 DIAGNOSIS — J3089 Other allergic rhinitis: Secondary | ICD-10-CM | POA: Diagnosis not present

## 2017-10-20 DIAGNOSIS — J3081 Allergic rhinitis due to animal (cat) (dog) hair and dander: Secondary | ICD-10-CM | POA: Diagnosis not present

## 2017-10-25 DIAGNOSIS — J3081 Allergic rhinitis due to animal (cat) (dog) hair and dander: Secondary | ICD-10-CM | POA: Diagnosis not present

## 2017-10-25 DIAGNOSIS — J3089 Other allergic rhinitis: Secondary | ICD-10-CM | POA: Diagnosis not present

## 2017-10-25 DIAGNOSIS — J301 Allergic rhinitis due to pollen: Secondary | ICD-10-CM | POA: Diagnosis not present

## 2017-10-28 DIAGNOSIS — J3089 Other allergic rhinitis: Secondary | ICD-10-CM | POA: Diagnosis not present

## 2017-10-28 DIAGNOSIS — J301 Allergic rhinitis due to pollen: Secondary | ICD-10-CM | POA: Diagnosis not present

## 2017-10-28 DIAGNOSIS — J3081 Allergic rhinitis due to animal (cat) (dog) hair and dander: Secondary | ICD-10-CM | POA: Diagnosis not present

## 2017-11-01 DIAGNOSIS — J3081 Allergic rhinitis due to animal (cat) (dog) hair and dander: Secondary | ICD-10-CM | POA: Diagnosis not present

## 2017-11-01 DIAGNOSIS — J3089 Other allergic rhinitis: Secondary | ICD-10-CM | POA: Diagnosis not present

## 2017-11-01 DIAGNOSIS — J301 Allergic rhinitis due to pollen: Secondary | ICD-10-CM | POA: Diagnosis not present

## 2017-11-08 DIAGNOSIS — J301 Allergic rhinitis due to pollen: Secondary | ICD-10-CM | POA: Diagnosis not present

## 2017-11-08 DIAGNOSIS — J3089 Other allergic rhinitis: Secondary | ICD-10-CM | POA: Diagnosis not present

## 2017-11-08 DIAGNOSIS — J3081 Allergic rhinitis due to animal (cat) (dog) hair and dander: Secondary | ICD-10-CM | POA: Diagnosis not present

## 2017-11-14 DIAGNOSIS — J3081 Allergic rhinitis due to animal (cat) (dog) hair and dander: Secondary | ICD-10-CM | POA: Diagnosis not present

## 2017-11-14 DIAGNOSIS — J301 Allergic rhinitis due to pollen: Secondary | ICD-10-CM | POA: Diagnosis not present

## 2017-11-14 DIAGNOSIS — J3089 Other allergic rhinitis: Secondary | ICD-10-CM | POA: Diagnosis not present

## 2017-11-22 DIAGNOSIS — Z23 Encounter for immunization: Secondary | ICD-10-CM | POA: Diagnosis not present

## 2017-11-22 DIAGNOSIS — J3089 Other allergic rhinitis: Secondary | ICD-10-CM | POA: Diagnosis not present

## 2017-11-22 DIAGNOSIS — J3081 Allergic rhinitis due to animal (cat) (dog) hair and dander: Secondary | ICD-10-CM | POA: Diagnosis not present

## 2017-11-22 DIAGNOSIS — J301 Allergic rhinitis due to pollen: Secondary | ICD-10-CM | POA: Diagnosis not present

## 2017-11-25 ENCOUNTER — Encounter: Payer: 59 | Admitting: Obstetrics & Gynecology

## 2017-12-01 ENCOUNTER — Encounter: Payer: 59 | Admitting: Obstetrics & Gynecology

## 2017-12-13 DIAGNOSIS — J3081 Allergic rhinitis due to animal (cat) (dog) hair and dander: Secondary | ICD-10-CM | POA: Diagnosis not present

## 2017-12-13 DIAGNOSIS — J3089 Other allergic rhinitis: Secondary | ICD-10-CM | POA: Diagnosis not present

## 2017-12-13 DIAGNOSIS — J301 Allergic rhinitis due to pollen: Secondary | ICD-10-CM | POA: Diagnosis not present

## 2017-12-20 DIAGNOSIS — J3081 Allergic rhinitis due to animal (cat) (dog) hair and dander: Secondary | ICD-10-CM | POA: Diagnosis not present

## 2017-12-20 DIAGNOSIS — J301 Allergic rhinitis due to pollen: Secondary | ICD-10-CM | POA: Diagnosis not present

## 2017-12-20 DIAGNOSIS — J3089 Other allergic rhinitis: Secondary | ICD-10-CM | POA: Diagnosis not present

## 2017-12-27 DIAGNOSIS — J3089 Other allergic rhinitis: Secondary | ICD-10-CM | POA: Diagnosis not present

## 2017-12-27 DIAGNOSIS — J301 Allergic rhinitis due to pollen: Secondary | ICD-10-CM | POA: Diagnosis not present

## 2017-12-27 DIAGNOSIS — J3081 Allergic rhinitis due to animal (cat) (dog) hair and dander: Secondary | ICD-10-CM | POA: Diagnosis not present

## 2018-01-03 DIAGNOSIS — H10413 Chronic giant papillary conjunctivitis, bilateral: Secondary | ICD-10-CM | POA: Diagnosis not present

## 2018-01-03 DIAGNOSIS — H43812 Vitreous degeneration, left eye: Secondary | ICD-10-CM | POA: Diagnosis not present

## 2018-01-03 DIAGNOSIS — H50812 Duane's syndrome, left eye: Secondary | ICD-10-CM | POA: Diagnosis not present

## 2018-01-06 DIAGNOSIS — J301 Allergic rhinitis due to pollen: Secondary | ICD-10-CM | POA: Diagnosis not present

## 2018-01-06 DIAGNOSIS — J3089 Other allergic rhinitis: Secondary | ICD-10-CM | POA: Diagnosis not present

## 2018-01-06 DIAGNOSIS — J3081 Allergic rhinitis due to animal (cat) (dog) hair and dander: Secondary | ICD-10-CM | POA: Diagnosis not present

## 2018-01-11 DIAGNOSIS — J3089 Other allergic rhinitis: Secondary | ICD-10-CM | POA: Diagnosis not present

## 2018-01-11 DIAGNOSIS — J301 Allergic rhinitis due to pollen: Secondary | ICD-10-CM | POA: Diagnosis not present

## 2018-01-11 DIAGNOSIS — J3081 Allergic rhinitis due to animal (cat) (dog) hair and dander: Secondary | ICD-10-CM | POA: Diagnosis not present

## 2018-01-19 DIAGNOSIS — J3089 Other allergic rhinitis: Secondary | ICD-10-CM | POA: Diagnosis not present

## 2018-01-19 DIAGNOSIS — J301 Allergic rhinitis due to pollen: Secondary | ICD-10-CM | POA: Diagnosis not present

## 2018-01-19 DIAGNOSIS — J3081 Allergic rhinitis due to animal (cat) (dog) hair and dander: Secondary | ICD-10-CM | POA: Diagnosis not present

## 2018-01-23 DIAGNOSIS — J3081 Allergic rhinitis due to animal (cat) (dog) hair and dander: Secondary | ICD-10-CM | POA: Diagnosis not present

## 2018-01-23 DIAGNOSIS — J301 Allergic rhinitis due to pollen: Secondary | ICD-10-CM | POA: Diagnosis not present

## 2018-01-23 DIAGNOSIS — J3089 Other allergic rhinitis: Secondary | ICD-10-CM | POA: Diagnosis not present

## 2018-01-30 DIAGNOSIS — J301 Allergic rhinitis due to pollen: Secondary | ICD-10-CM | POA: Diagnosis not present

## 2018-01-30 DIAGNOSIS — J3089 Other allergic rhinitis: Secondary | ICD-10-CM | POA: Diagnosis not present

## 2018-01-30 DIAGNOSIS — J3081 Allergic rhinitis due to animal (cat) (dog) hair and dander: Secondary | ICD-10-CM | POA: Diagnosis not present

## 2018-02-04 NOTE — Progress Notes (Signed)
Subjective:    Patient ID: Maria Frank, female    DOB: Aug 01, 1975, 43 y.o.   MRN: [id number]  HPI She is here for a physical exam.   She has gained 50 lbs in the past few years.  She is tired, her BP is on the high side.  She wants to lose weight.  She has a membership at the rec on campus and knows she needs to start going-she has not been exercising.    She has itchy skin - it is in multiple areas. It is usually on her torso someplace.  She denies new products.  She wants to see derm.  Lotion does not help.  She denies rash.  She is taking zyrtec daily.  She gets allergy injections.    Feet pain - she has pain in her joints - especially the past 4 months.  She did break her right foot and it hurts there, but the toes hurt.  She also has onychomycosis.    Medications and allergies reviewed with patient and updated if appropriate.  Patient Active Problem List   Diagnosis Date Noted  . Foot pain, bilateral 02/06/2018  . Itchy skin 02/06/2018  . IBS (irritable bowel syndrome) 02/06/2018  . Fatigue 04/13/2017  . Onychomycosis 03/04/2015  . Renal cyst 03/04/2015  . Osteopenia 03/04/2015  . Insomnia 03/04/2015  . IUD (intrauterine device) in place 12/01/2012  . Trivial mitral regurgitation 07/03/2010  . Hyperlipidemia 03/30/2010  . ALLERGIC RHINITIS 03/30/2010  . Headache 03/30/2010    Current Outpatient Medications on File Prior to Visit  Medication Sig Dispense Refill  . cetirizine (ZYRTEC) 10 MG tablet Take 1 tablet (10 mg total) by mouth daily. 30 tablet 11  . dicyclomine (BENTYL) 10 MG capsule Take 2 capsules (20 mg total) by mouth 4 (four) times daily as needed for spasms. 90 capsule 3  . EPIPEN 2-PAK 0.3 MG/0.3ML SOAJ injection Inject 0.3 mLs (0.3 mg total) into the skin once as needed. 1 Device   . mometasone (NASONEX) 50 MCG/ACT nasal spray 2 sprays by Nasal route daily.      Marland Kitchen PAZEO 0.7 % SOLN PLACE 1 DROP INTO BOTH EYES EVERY DAY  6   No current  facility-administered medications on file prior to visit.     Past Medical History:  Diagnosis Date  . ALLERGIC RHINITIS   . ARTHRITIS   . CHICKENPOX, HX OF   . DEPRESSION   . Heart murmur   . HYPERLIPIDEMIA   . IBS (irritable bowel syndrome)   . Migraine     Past Surgical History:  Procedure Laterality Date  . COLONOSCOPY  11/2016  . NO PAST SURGERIES      Social History   Socioeconomic History  . Marital status: Married    Spouse name: Not on file  . Number of children: Not on file  . Years of education: Not on file  . Highest education level: Not on file  Occupational History  . Not on file  Social Needs  . Financial resource strain: Not on file  . Food insecurity:    Worry: Not on file    Inability: Not on file  . Transportation needs:    Medical: Not on file    Non-medical: Not on file  Tobacco Use  . Smoking status: Never Smoker  . Smokeless tobacco: Never Used  Substance and Sexual Activity  . Alcohol use: Yes    Alcohol/week: 0.0 standard drinks    Comment: Social  .  Drug use: No  . Sexual activity: Yes    Partners: Male    Birth control/protection: None, I.U.D.  Lifestyle  . Physical activity:    Days per week: Not on file    Minutes per session: Not on file  . Stress: Not on file  Relationships  . Social connections:    Talks on phone: Not on file    Gets together: Not on file    Attends religious service: Not on file    Active member of club or organization: Not on file    Attends meetings of clubs or organizations: Not on file    Relationship status: Not on file  Other Topics Concern  . Not on file  Social History Narrative   Married since 2004, but separated 10/2012 from spouse. Originally from [place], in [place] since 2008. 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 Hypertension Father   . Cardiomyopathy Father   . Sleep apnea Father   . Cancer Mother        pancreatic, liver and renal     . Osteoporosis Mother   . Renal cancer Mother   . Hypertension Paternal Grandfather   . Arthritis Other        parent & grandparents  . Breast cancer Maternal Aunt 60  . Breast cancer Cousin 30  . Asthma Sister   . Sleep apnea Brother   . Asthma Sister   . Sleep apnea Brother   . Stomach cancer Neg Hx   . Rectal cancer Neg Hx   . Esophageal cancer Neg Hx   . Colon cancer Neg Hx     Review of Systems  Constitutional: Negative for chills and fever.  Eyes: Negative for visual disturbance.  Respiratory: Negative for cough, shortness of breath and wheezing.   Cardiovascular: Negative for chest pain, palpitations and leg swelling.  Gastrointestinal: Positive for nausea. Negative for abdominal pain, blood in stool, constipation and diarrhea.       Rare GERD  Genitourinary: Negative for dysuria and hematuria.  Musculoskeletal: Positive for arthralgias (feet, toes, occ hands).  Skin: Negative for rash.       Itchy skin - torso  Neurological: Positive for headaches (cluster headaches - about 4 times a year).  Psychiatric/Behavioral: Negative for decreased concentration. The patient is not nervous/anxious.        Objective:   Vitals:   02/06/18 0839  BP: 132/80  Pulse: 78  Resp: 16  Temp: 98.5 F (36.9 C)  SpO2: 98%   Filed Weights   02/06/18 0839  Weight: 190 lb 6.4 oz (86.4 kg)   Body mass index is 32.94 kg/m.  BP Readings from Last 3 Encounters:  02/06/18 132/80  04/13/17 124/82  02/07/17 110/70    Wt Readings from Last 3 Encounters:  02/06/18 190 lb 6.4 oz (86.4 kg)  04/13/17 183 lb (83 kg)  02/07/17 185 lb 2 oz (84 kg)     Physical Exam Constitutional: She appears well-developed and well-nourished. No distress.  HENT:  Head: Normocephalic and atraumatic.  Right Ear: External ear normal. Normal ear canal and TM Left Ear: External ear normal.  Normal ear canal and TM Mouth/Throat: Oropharynx is clear and moist.  Eyes: Conjunctivae and EOM are normal.   Neck: Neck supple. No tracheal deviation present. No thyromegaly present.  No carotid bruit  Cardiovascular: Normal rate, regular rhythm and normal heart sounds.   1/6 systolic murmur heard.  No edema. Pulmonary/Chest: Effort normal and breath sounds normal. No respiratory distress. She has no wheezes. She has no rales.  Breast: deferred to Gyn Abdominal: Soft. She exhibits no distension. There is no tenderness.  Lymphadenopathy: She has no cervical adenopathy.  Skin: Skin is warm and dry. She is not diaphoretic.  Psychiatric: She has a normal mood and affect. Her behavior is normal.        Assessment & Plan:   Physical exam: Screening blood work  ordered Immunizations  Flu vaccine up to date, tdap up to date Mammogram  - last done ? 2017 - will schedule Gyn  Due - will schedule Eye exams   Up to date  Exercise  None - will start Weight   Discussed weight loss at length Skin  Itchy skin - will refer to derm for that and skin check Substance abuse  none  See Problem List for Assessment and Plan of chronic medical problems.

## 2018-02-04 NOTE — Patient Instructions (Addendum)
Tests ordered today. Your results will be released to [facility] (or called to you) after review, usually within 72hours after test completion. If any changes need to be made, you will be notified at that same time.  All other Health Maintenance issues reviewed.   All recommended immunizations and age-appropriate screenings are up-to-date or discussed.  No immunizations administered today.   Medications reviewed and updated.  Changes include :   none   A referral was ordered for dermatology, podiatry.  Please followup in one year   Health Maintenance, Female Adopting a healthy lifestyle and getting preventive care can go a long way to promote health and wellness. Talk with your health care provider about what schedule of regular examinations is right for you. This is a good chance for you to check in with your provider about disease prevention and staying healthy. In between checkups, there are plenty of things you can do on your own. Experts have done a lot of research about which lifestyle changes and preventive measures are most likely to keep you healthy. Ask your health care provider for more information. Weight and diet Eat a healthy diet  Be sure to include plenty of vegetables, fruits, low-fat dairy products, and lean protein.  Do not eat a lot of foods high in solid fats, added sugars, or salt.  Get regular exercise. This is one of the most important things you can do for your health. ? Most adults should exercise for at least 150 minutes each week. The exercise should increase your heart rate and make you sweat (moderate-intensity exercise). ? Most adults should also do strengthening exercises at least twice a week. This is in addition to the moderate-intensity exercise. Maintain a healthy weight  Body mass index (BMI) is a measurement that can be used to identify possible weight problems. It estimates body fat based on height and weight. Your health care provider can help  determine your BMI and help you achieve or maintain a healthy weight.  For females 50 years of age and older: ? A BMI below 18.5 is considered underweight. ? A BMI of 18.5 to 24.9 is normal. ? A BMI of 25 to 29.9 is considered overweight. ? A BMI of 30 and above is considered obese. Watch levels of cholesterol and blood lipids  You should start having your blood tested for lipids and cholesterol at 44 years of age, then have this test every 5 years.  You may need to have your cholesterol levels checked more often if: ? Your lipid or cholesterol levels are high. ? You are older than 44 years of age. ? You are at high risk for heart disease. Cancer screening Lung Cancer  Lung cancer screening is recommended for adults 7-28 years old who are at high risk for lung cancer because of a history of smoking.  A yearly low-dose CT scan of the lungs is recommended for people who: ? Currently smoke. ? Have quit within the past 15 years. ? Have at least a 30-pack-year history of smoking. A pack year is smoking an average of one pack of cigarettes a day for 1 year.  Yearly screening should continue until it has been 15 years since you quit.  Yearly screening should stop if you develop a health problem that would prevent you from having lung cancer treatment. Breast Cancer  Practice breast self-awareness. This means understanding how your breasts normally appear and feel.  It also means doing regular breast self-exams. Let your health care provider  know about any changes, no matter how small.  If you are in your 20s or 30s, you should have a clinical breast exam (CBE) by a health care provider every 1-3 years as part of a regular health exam.  If you are 45 or older, have a CBE every year. Also consider having a breast X-ray (mammogram) every year.  If you have a family history of breast cancer, talk to your health care provider about genetic screening.  If you are at high risk for breast  cancer, talk to your health care provider about having an MRI and a mammogram every year.  Breast cancer gene (BRCA) assessment is recommended for women who have family members with BRCA-related cancers. BRCA-related cancers include: ? Breast. ? Ovarian. ? Tubal. ? Peritoneal cancers.  Results of the assessment will determine the need for genetic counseling and BRCA1 and BRCA2 testing. Cervical Cancer Your health care provider may recommend that you be screened regularly for cancer of the pelvic organs (ovaries, uterus, and vagina). This screening involves a pelvic examination, including checking for microscopic changes to the surface of your cervix (Pap test). You may be encouraged to have this screening done every 3 years, beginning at age 70.  For women ages 51-65, health care providers may recommend pelvic exams and Pap testing every 3 years, or they may recommend the Pap and pelvic exam, combined with testing for human papilloma virus (HPV), every 5 years. Some types of HPV increase your risk of cervical cancer. Testing for HPV may also be done on women of any age with unclear Pap test results.  Other health care providers may not recommend any screening for nonpregnant women who are considered low risk for pelvic cancer and who do not have symptoms. Ask your health care provider if a screening pelvic exam is right for you.  If you have had past treatment for cervical cancer or a condition that could lead to cancer, you need Pap tests and screening for cancer for at least 20 years after your treatment. If Pap tests have been discontinued, your risk factors (such as having a new sexual partner) need to be reassessed to determine if screening should resume. Some women have medical problems that increase the chance of getting cervical cancer. In these cases, your health care provider may recommend more frequent screening and Pap tests. Colorectal Cancer  This type of cancer can be detected and  often prevented.  Routine colorectal cancer screening usually begins at 44 years of age and continues through 44 years of age.  Your health care provider may recommend screening at an earlier age if you have risk factors for colon cancer.  Your health care provider may also recommend using home test kits to check for hidden blood in the stool.  A small camera at the end of a tube can be used to examine your colon directly (sigmoidoscopy or colonoscopy). This is done to check for the earliest forms of colorectal cancer.  Routine screening usually begins at age 36.  Direct examination of the colon should be repeated every 5-10 years through 44 years of age. However, you may need to be screened more often if early forms of precancerous polyps or small growths are found. Skin Cancer  Check your skin from head to toe regularly.  Tell your health care provider about any new moles or changes in moles, especially if there is a change in a mole's shape or color.  Also tell your health care provider if  you have a mole that is larger than the size of a pencil eraser.  Always use sunscreen. Apply sunscreen liberally and repeatedly throughout the day.  Protect yourself by wearing long sleeves, pants, a wide-brimmed hat, and sunglasses whenever you are outside. Heart disease, diabetes, and high blood pressure  High blood pressure causes heart disease and increases the risk of stroke. High blood pressure is more likely to develop in: ? People who have blood pressure in the high end of the normal range (130-139/85-89 mm Hg). ? People who are overweight or obese. ? People who are African American.  If you are 14-35 years of age, have your blood pressure checked every 3-5 years. If you are 68 years of age or older, have your blood pressure checked every year. You should have your blood pressure measured twice-once when you are at a hospital or clinic, and once when you are not at a hospital or clinic.  Record the average of the two measurements. To check your blood pressure when you are not at a hospital or clinic, you can use: ? An automated blood pressure machine at a pharmacy. ? A home blood pressure monitor.  If you are between 61 years and 62 years old, ask your health care provider if you should take aspirin to prevent strokes.  Have regular diabetes screenings. This involves taking a blood sample to check your fasting blood sugar level. ? If you are at a normal weight and have a low risk for diabetes, have this test once every three years after 44 years of age. ? If you are overweight and have a high risk for diabetes, consider being tested at a younger age or more often. Preventing infection Hepatitis B  If you have a higher risk for hepatitis B, you should be screened for this virus. You are considered at high risk for hepatitis B if: ? You were born in a country where hepatitis B is common. Ask your health care provider which countries are considered high risk. ? Your parents were born in a high-risk country, and you have not been immunized against hepatitis B (hepatitis B vaccine). ? You have HIV or AIDS. ? You use needles to inject street drugs. ? You live with someone who has hepatitis B. ? You have had sex with someone who has hepatitis B. ? You get hemodialysis treatment. ? You take certain medicines for conditions, including cancer, organ transplantation, and autoimmune conditions. Hepatitis C  Blood testing is recommended for: ? Everyone born from [birth year] through 1966. ? Anyone with known risk factors for hepatitis C. Sexually transmitted infections (STIs)  You should be screened for sexually transmitted infections (STIs) including gonorrhea and chlamydia if: ? You are sexually active and are younger than 44 years of age. ? You are older than 44 years of age and your health care provider tells you that you are at risk for this type of infection. ? Your sexual activity  has changed since you were last screened and you are at an increased risk for chlamydia or gonorrhea. Ask your health care provider if you are at risk.  If you do not have HIV, but are at risk, it may be recommended that you take a prescription medicine daily to prevent HIV infection. This is called pre-exposure prophylaxis (PrEP). You are considered at risk if: ? You are sexually active and do not regularly use condoms or know the HIV status of your partner(s). ? You take drugs by injection. ? You  are sexually active with a partner who has HIV. Talk with your health care provider about whether you are at high risk of being infected with HIV. If you choose to begin PrEP, you should first be tested for HIV. You should then be tested every 3 months for as long as you are taking PrEP. Pregnancy  If you are premenopausal and you may become pregnant, ask your health care provider about preconception counseling.  If you may become pregnant, take 400 to 800 micrograms (mcg) of folic acid every day.  If you want to prevent pregnancy, talk to your health care provider about birth control (contraception). Osteoporosis and menopause  Osteoporosis is a disease in which the bones lose minerals and strength with aging. This can result in serious bone fractures. Your risk for osteoporosis can be identified using a bone density scan.  If you are 40 years of age or older, or if you are at risk for osteoporosis and fractures, ask your health care provider if you should be screened.  Ask your health care provider whether you should take a calcium or vitamin D supplement to lower your risk for osteoporosis.  Menopause may have certain physical symptoms and risks.  Hormone replacement therapy may reduce some of these symptoms and risks. Talk to your health care provider about whether hormone replacement therapy is right for you. Follow these instructions at home:  Schedule regular health, dental, and eye  exams.  Stay current with your immunizations.  Do not use any tobacco products including cigarettes, chewing tobacco, or electronic cigarettes.  If you are pregnant, do not drink alcohol.  If you are breastfeeding, limit how much and how often you drink alcohol.  Limit alcohol intake to no more than 1 drink per day for nonpregnant women. One drink equals 12 ounces of beer, 5 ounces of wine, or 1 ounces of hard liquor.  Do not use street drugs.  Do not share needles.  Ask your health care provider for help if you need support or information about quitting drugs.  Tell your health care provider if you often feel depressed.  Tell your health care provider if you have ever been abused or do not feel safe at home. This information is not intended to replace advice given to you by your health care provider. Make sure you discuss any questions you have with your health care provider. Document Released: 08/03/2010 Document Revised: 06/26/2015 Document Reviewed: 10/22/2014 Elsevier Interactive Patient Education  2019 [employer].

## 2018-02-06 ENCOUNTER — Other Ambulatory Visit (INDEPENDENT_AMBULATORY_CARE_PROVIDER_SITE_OTHER): Payer: BLUE CROSS/BLUE SHIELD

## 2018-02-06 ENCOUNTER — Ambulatory Visit (INDEPENDENT_AMBULATORY_CARE_PROVIDER_SITE_OTHER): Payer: BLUE CROSS/BLUE SHIELD | Admitting: Internal Medicine

## 2018-02-06 ENCOUNTER — Encounter: Payer: Self-pay | Admitting: Internal Medicine

## 2018-02-06 VITALS — BP 132/80 | HR 78 | Temp 98.5°F | Resp 16 | Ht 63.75 in | Wt 190.4 lb

## 2018-02-06 DIAGNOSIS — E7849 Other hyperlipidemia: Secondary | ICD-10-CM

## 2018-02-06 DIAGNOSIS — E669 Obesity, unspecified: Secondary | ICD-10-CM

## 2018-02-06 DIAGNOSIS — I340 Nonrheumatic mitral (valve) insufficiency: Secondary | ICD-10-CM

## 2018-02-06 DIAGNOSIS — J3089 Other allergic rhinitis: Secondary | ICD-10-CM | POA: Diagnosis not present

## 2018-02-06 DIAGNOSIS — J301 Allergic rhinitis due to pollen: Secondary | ICD-10-CM | POA: Diagnosis not present

## 2018-02-06 DIAGNOSIS — M79671 Pain in right foot: Secondary | ICD-10-CM

## 2018-02-06 DIAGNOSIS — Z0000 Encounter for general adult medical examination without abnormal findings: Secondary | ICD-10-CM

## 2018-02-06 DIAGNOSIS — G44019 Episodic cluster headache, not intractable: Secondary | ICD-10-CM

## 2018-02-06 DIAGNOSIS — Z114 Encounter for screening for human immunodeficiency virus [HIV]: Secondary | ICD-10-CM

## 2018-02-06 DIAGNOSIS — Z1283 Encounter for screening for malignant neoplasm of skin: Secondary | ICD-10-CM | POA: Diagnosis not present

## 2018-02-06 DIAGNOSIS — M79672 Pain in left foot: Secondary | ICD-10-CM

## 2018-02-06 DIAGNOSIS — L299 Pruritus, unspecified: Secondary | ICD-10-CM | POA: Diagnosis not present

## 2018-02-06 DIAGNOSIS — B351 Tinea unguium: Secondary | ICD-10-CM

## 2018-02-06 DIAGNOSIS — K582 Mixed irritable bowel syndrome: Secondary | ICD-10-CM

## 2018-02-06 DIAGNOSIS — E66811 Obesity, class 1: Secondary | ICD-10-CM

## 2018-02-06 DIAGNOSIS — J3081 Allergic rhinitis due to animal (cat) (dog) hair and dander: Secondary | ICD-10-CM | POA: Diagnosis not present

## 2018-02-06 DIAGNOSIS — K589 Irritable bowel syndrome without diarrhea: Secondary | ICD-10-CM | POA: Insufficient documentation

## 2018-02-06 LAB — COMPREHENSIVE METABOLIC PANEL
ALT: 22 U/L (ref 0–35)
AST: 23 U/L (ref 0–37)
Albumin: 4 g/dL (ref 3.5–5.2)
Alkaline Phosphatase: 54 U/L (ref 39–117)
BUN: 13 mg/dL (ref 6–23)
CO2: 23 mEq/L (ref 19–32)
Calcium: 9.2 mg/dL (ref 8.4–10.5)
Chloride: 104 mEq/L (ref 96–112)
Creatinine, Ser: 0.81 mg/dL (ref 0.40–1.20)
GFR: 81.72 mL/min (ref >60.00)
Glucose, Bld: 88 mg/dL (ref 70–99)
POTASSIUM: 4.1 meq/L (ref 3.5–5.1)
Sodium: 136 mEq/L (ref 135–145)
Total Bilirubin: 0.3 mg/dL (ref 0.2–1.2)
Total Protein: 7.1 g/dL (ref 6.0–8.3)

## 2018-02-06 LAB — CBC WITH DIFFERENTIAL/PLATELET
BASOS PCT: 0.6 % (ref 0.0–3.0)
Basophils Absolute: 0.1 10*3/uL (ref 0.0–0.1)
EOS PCT: 0.7 % (ref 0.0–5.0)
Eosinophils Absolute: 0.1 10*3/uL (ref 0.0–0.7)
HEMATOCRIT: 39.3 % (ref 36.0–46.0)
Hemoglobin: 13.3 g/dL (ref 12.0–15.0)
Lymphocytes Relative: 26.5 % (ref 12.0–46.0)
Lymphs Abs: 2.3 10*3/uL (ref 0.7–4.0)
MCHC: 33.8 g/dL (ref 30.0–36.0)
MCV: 92.8 fl (ref 78.0–100.0)
MONOS PCT: 7.3 % (ref 3.0–12.0)
Monocytes Absolute: 0.6 10*3/uL (ref 0.1–1.0)
Neutro Abs: 5.7 10*3/uL (ref 1.4–7.7)
Neutrophils Relative %: 64.9 % (ref 43.0–77.0)
Platelets: 247 10*3/uL (ref 150.0–400.0)
RBC: 4.23 Mil/uL (ref 3.87–5.11)
RDW: 12.8 % (ref 11.5–15.5)
WBC: 8.9 10*3/uL (ref 4.0–10.5)

## 2018-02-06 LAB — LIPID PANEL
Cholesterol: 237 mg/dL — ABNORMAL HIGH (ref 0–200)
HDL: 68.6 mg/dL (ref >39.00)
LDL Cholesterol: 130 mg/dL — ABNORMAL HIGH (ref 0–99)
NonHDL: 168.49
TRIGLYCERIDES: 191 mg/dL — AB (ref 0.0–149.0)
Total CHOL/HDL Ratio: 3
VLDL: 38.2 mg/dL (ref 0.0–40.0)

## 2018-02-06 LAB — TSH: TSH: 2.6 u[IU]/mL (ref 0.35–4.50)

## 2018-02-06 NOTE — Assessment & Plan Note (Signed)
Having pain where she broke 1 of her metatarsals and also pain great toes bilaterally Likely arthritis Would like to see podiatry-referred

## 2018-02-06 NOTE — Assessment & Plan Note (Signed)
Controlled with dicyclomine and FODMAP diet

## 2018-02-06 NOTE — Assessment & Plan Note (Signed)
History of hyperlipidemia Stressed importance of regular exercise Will be working on weight loss Low-fat/cholesterol diet Check CMP, lipid panel, TSH

## 2018-02-06 NOTE — Assessment & Plan Note (Addendum)
She has gained more weight-likely related to medication she was taking She does want to lose weight and plans on starting exercise Discussed the importance of decreasing portions, along with eating a healthy diet-low sugar/carbohydrate Can consider weight management clinic if she is not able to lose weight on her own

## 2018-02-06 NOTE — Assessment & Plan Note (Signed)
1/6 systolic murmur on exam No need to recheck echo at this time-we will monitor clinically

## 2018-02-06 NOTE — Assessment & Plan Note (Signed)
Has had onychomycosis for years-was successfully treated with oral medication in the past Will refer to podiatry as he is also having pain would like that evaluated

## 2018-02-06 NOTE — Assessment & Plan Note (Signed)
Gets cluster headaches approximately 4 times a year Treated with over-the-counter medication and sleeping

## 2018-02-06 NOTE — Assessment & Plan Note (Signed)
Having itchy skin on her torso-no changes in products and does not appear to be dry skin Interested in seeing dermatology-referred

## 2018-02-07 LAB — HIV ANTIBODY (ROUTINE TESTING W REFLEX): HIV 1&2 Ab, 4th Generation: NONREACTIVE

## 2018-02-09 ENCOUNTER — Encounter: Payer: Self-pay | Admitting: Internal Medicine

## 2018-02-16 ENCOUNTER — Other Ambulatory Visit: Payer: Self-pay | Admitting: Podiatry

## 2018-02-16 ENCOUNTER — Ambulatory Visit (INDEPENDENT_AMBULATORY_CARE_PROVIDER_SITE_OTHER): Payer: BLUE CROSS/BLUE SHIELD

## 2018-02-16 ENCOUNTER — Ambulatory Visit (INDEPENDENT_AMBULATORY_CARE_PROVIDER_SITE_OTHER): Payer: BLUE CROSS/BLUE SHIELD | Admitting: Podiatry

## 2018-02-16 VITALS — BP 127/85 | HR 79

## 2018-02-16 DIAGNOSIS — M779 Enthesopathy, unspecified: Secondary | ICD-10-CM

## 2018-02-16 DIAGNOSIS — M79672 Pain in left foot: Secondary | ICD-10-CM

## 2018-02-16 DIAGNOSIS — M79671 Pain in right foot: Secondary | ICD-10-CM

## 2018-02-16 DIAGNOSIS — L600 Ingrowing nail: Secondary | ICD-10-CM

## 2018-02-16 DIAGNOSIS — M205X1 Other deformities of toe(s) (acquired), right foot: Secondary | ICD-10-CM

## 2018-02-16 DIAGNOSIS — M109 Gout, unspecified: Secondary | ICD-10-CM

## 2018-02-16 DIAGNOSIS — M7752 Other enthesopathy of left foot: Secondary | ICD-10-CM

## 2018-02-16 DIAGNOSIS — M7751 Other enthesopathy of right foot: Secondary | ICD-10-CM

## 2018-02-16 DIAGNOSIS — M778 Other enthesopathies, not elsewhere classified: Secondary | ICD-10-CM

## 2018-02-16 MED ORDER — TERBINAFINE HCL 250 MG PO TABS: 250.0000 mg | ORAL_TABLET | Freq: Every day | ORAL | 0 refills | Status: DC

## 2018-02-16 MED ORDER — NEOMYCIN-POLYMYXIN-HC 3.5-10000-1 OT SOLN: OTIC | 0 refills | Status: DC

## 2018-02-16 MED ORDER — TRIAMCINOLONE ACETONIDE 10 MG/ML IJ SUSP
10.0000 mg | Freq: Once | INTRAMUSCULAR | Status: AC
  Administered 2018-02-16: 10 mg

## 2018-02-16 NOTE — Progress Notes (Signed)
Subjective:   Patient ID: Timoteo Ace, female   DOB: 43 y.o.   MRN: [id number]   HPI Patient states she is had a lot of pain of her big toe joints right over left and gets mild to moderate pain of the other joints bilateral.  States that this is been going on for at least 8 months in the big toe joint right has been quite sore and also she complains of nail disease with severe thickness fourth right that she states is been chronically tender for a year and other nails that are yellow with discoloration with history of Lamisil 12 years ago which effectively handled the other nails except for the fourth right.  Patient does not smoke likes to be active   Review of Systems  All other systems reviewed and are negative.       Objective:  Physical Exam Vitals signs and nursing note reviewed.  Constitutional:      Appearance: She is well-developed.  Pulmonary:     Effort: Pulmonary effort is normal.  Musculoskeletal: Normal range of motion.  Skin:    General: Skin is warm.  Neurological:     Mental Status: She is alert.     Neurovascular status intact muscle strength is adequate range of motion within normal limits with patient noted to have a thickened dystrophic painful fourth nail right and inflammation pain of the first MPJ right upper left with mild discomfort in the adjacent joints but nowhere near the discomfort of the first MPJ.  I noted moderate reduction of motion first MPJ right foot but no crepitus of the joint.  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  I infiltrated the right fourth toe 60 mg like  Marcaine mixture sterile prep applied and using sterile instrumentation remove the fourth nail exposed matrix and applied phenol for applications 30 seconds followed by alcohol lavage and sterile dressing.  Gave instructions on soaks for this and to leave dressing on 24 hours but to take it off early if throbbing were to occur.  The big toe joint right I did discuss the elongated metatarsal and the possibility of hallux limitus rigidus with arthritis along with capsulitis and I did do an injection of the first MPJ 3 mg Kenalog 5 mg Xylocaine and advised on reduced activity and will reevaluate the possibility for shortening osteotomy.  Also placed on Lamisil 250 mg daily to take for 90 days and she did have liver functions just done and we will see her results and I did send for arthritic profile to rule out systemic arthritis given her inflammation she is experiencing  X-rays indicate elongated first metatarsal right over left with mild narrowing of the joint and indications of stress on the joint surface signed visit

## 2018-02-16 NOTE — Patient Instructions (Addendum)
Hallux Rigidus  Hallux rigidus is a type of joint pain or joint disease (arthritis) that affects your big toe (hallux). This condition involves the joint that connects the base of your big toe to the main part of your foot (metatarsophalangeal joint). This condition can cause your big toe to become stiff, painful, and difficult to move. Symptoms may get worse with movement or in cold or damp weather. The condition also gets worse over time. What are the causes? This condition may be caused by having a foot that does not function the way that it should or has an abnormal shape (structural deformity). These foot problems can run in families (be hereditary). This condition can also be caused by:  Injury.  Overuse.  Certain inflammatory diseases, including gout and rheumatoid arthritis. What increases the risk? This condition is more likely to develop in people who:  Have a foot bone (metatarsal) that is longer or higher than normal.  Have a family history of hallux rigidus.  Have previously injured their big toe.  Have feet that do not have a curve (arch) on the inner side of the foot. This may be called flat feet or fallen arches.  Turn their ankles in when they walk (pronation).  Have rheumatoid arthritis or gout.  Have to stoop down often at work. What are the signs or symptoms? Symptoms of this condition include:  Big toe pain.  Stiffness and difficulty moving the big toe.  Swelling of the toe and surrounding area.  Bone spurs. These are bony growths that can form on the joint of the big toe.  A limp. How is this diagnosed? This condition is diagnosed based on a medical history and physical exam. This may include X-rays. How is this treated? Treatment for this condition includes:  Wearing roomy, comfortable shoes that have a large toe box.  Putting orthotic devices in your shoes.  Pain medicines.  Physical therapy.  Icing the injured area.  Alternate between  putting your foot in cold water then warm water. If your condition is severe, treatment may include:  Corticosteroid injections to relieve pain.  Surgery to remove bone spurs, fuse damaged bones together, or replace the entire joint. Follow these instructions at home:  Take over-the-counter and prescription medicines only as told by your health care provider.  Do not wear high heels or other restrictive footwear. Wear comfortable, supportive shoes that have a large toe box.  Wear orthotics as told by your health care provider, if this applies.  Put your feet in cold water for 30 seconds, then in warm water for 30 seconds. Alternate between the cold and warm water for 5 minutes. Do this several times a day or as told by your health care provider.  If directed, apply ice to the injured area. ? Put ice in a plastic bag. ? Place a towel between your skin and the bag. ? Leave the ice on for 20 minutes, 2-3 times per day.  Do foot exercises as instructed by your health care provider or a physical therapist.  Keep all follow-up visits as told by your health care provider. This is important. Contact a health care provider if:  You notice bone spurs or growths on or around your big toe.  Your pain does not get better or it gets worse.  You have pain while resting.  You have pain in other parts of your body, such as your back, hip, or knee.  You start to limp. This information is not   intended to replace advice given to you by your health care provider. Make sure you discuss any questions you have with your health care provider. Document Released: 01/18/2005 Document Revised: 06/26/2015 Document Reviewed: 09/25/2014 Elsevier Interactive Patient Education  2019 [place] Instructions    THE DAY AFTER THE PROCEDURE  Place 1/4 cup of epsom salts in a quart of warm tap water.  Submerge your foot or feet with outer bandage intact for the initial soak; this will allow the  bandage to become moist and wet for easy lift off.  Once you remove your bandage, continue to soak in the solution for 20 minutes.  This soak should be done twice a day.  Next, remove your foot or feet from solution, blot dry the affected area and cover.  You may use a band aid large enough to cover the area or use gauze and tape.  Apply other medications to the area as directed by the doctor such as polysporin neosporin.  IF YOUR SKIN BECOMES IRRITATED WHILE USING THESE INSTRUCTIONS, IT IS OKAY TO SWITCH TO  WHITE VINEGAR AND WATER. Or you may use antibacterial soap and water to keep the toe clean  Monitor for any signs/symptoms of infection. Call the office immediately if any occur or go directly to the emergency room. Call with any questions/concerns.    [facility] Instructions-Post Nail Surgery  You have had your ingrown toenail and root treated with a chemical.  This chemical causes a burn that will drain and ooze like a blister.  This can drain for 6-8 weeks or longer.  It is important to keep this area clean, covered, and follow the soaking instructions dispensed at the time of your surgery.  This area will eventually dry and form a scab.  Once the scab forms you no longer need to soak or apply a dressing.  If at any time you experience an increase in pain, redness, swelling, or drainage, you should contact the office as soon as possible.

## 2018-02-17 DIAGNOSIS — M109 Gout, unspecified: Secondary | ICD-10-CM | POA: Diagnosis not present

## 2018-02-20 LAB — ANA, IFA COMPREHENSIVE PANEL
Anti Nuclear Antibody(ANA): NEGATIVE
ENA SM Ab Ser-aCnc: <1 AI
SM/RNP: <1 AI
SSA (Ro) (ENA) Antibody, IgG: 1.2 AI — AB
SSB (La) (ENA) Antibody, IgG: <1 AI
Scleroderma (Scl-70) (ENA) Antibody, IgG: <1 AI
ds DNA Ab: 1 [IU]/mL

## 2018-02-20 LAB — RHEUMATOID FACTOR: Rheumatoid fact SerPl-aCnc: <14 IU/mL (ref <14)

## 2018-02-20 LAB — SEDIMENTATION RATE: Sed Rate: 39 mm/h — ABNORMAL HIGH (ref 0–20)

## 2018-02-20 LAB — C-REACTIVE PROTEIN: CRP: 3.3 mg/L (ref <8.0)

## 2018-02-20 LAB — URIC ACID: Uric Acid, Serum: 2.2 mg/dL — ABNORMAL LOW (ref 2.5–7.0)

## 2018-02-21 DIAGNOSIS — J301 Allergic rhinitis due to pollen: Secondary | ICD-10-CM | POA: Diagnosis not present

## 2018-02-21 DIAGNOSIS — J3089 Other allergic rhinitis: Secondary | ICD-10-CM | POA: Diagnosis not present

## 2018-02-21 DIAGNOSIS — J3081 Allergic rhinitis due to animal (cat) (dog) hair and dander: Secondary | ICD-10-CM | POA: Diagnosis not present

## 2018-02-23 ENCOUNTER — Encounter: Payer: Self-pay | Admitting: Internal Medicine

## 2018-03-02 ENCOUNTER — Ambulatory Visit (INDEPENDENT_AMBULATORY_CARE_PROVIDER_SITE_OTHER): Payer: BLUE CROSS/BLUE SHIELD | Admitting: Podiatry

## 2018-03-02 ENCOUNTER — Encounter: Payer: Self-pay | Admitting: Podiatry

## 2018-03-02 DIAGNOSIS — M205X1 Other deformities of toe(s) (acquired), right foot: Secondary | ICD-10-CM | POA: Diagnosis not present

## 2018-03-02 DIAGNOSIS — L600 Ingrowing nail: Secondary | ICD-10-CM

## 2018-03-02 NOTE — Patient Instructions (Signed)
Pre-Operative Instructions  Congratulations, you have decided to take an important step towards improving your quality of life.  You can be assured that the doctors and staff at [facility] will be with you every step of the way.  Here are some important things you should know:  1. Plan to be at the surgery center/hospital at least 1 (one) hour prior to your scheduled time, unless otherwise directed by the surgical center/hospital staff.  You must have a responsible adult accompany you, remain during the surgery and drive you home.  Make sure you have directions to the surgical center/hospital to ensure you arrive on time. 2. If you are having surgery at [facility] or [facility], you will need a copy of your medical history and physical form from your family physician within one month prior to the date of surgery. We will give you a form for your primary physician to complete.  3. We make every effort to accommodate the date you request for surgery.  However, there are times where surgery dates or times have to be moved.  We will contact you as soon as possible if a change in schedule is required.   4. No aspirin/ibuprofen for one week before surgery.  If you are on aspirin, any non-steroidal anti-inflammatory medications (Mobic, Aleve, Ibuprofen) should not be taken seven (7) days prior to your surgery.  You make take Tylenol for pain prior to surgery.  5. Medications - If you are taking daily heart and blood pressure medications, seizure, reflux, allergy, asthma, anxiety, pain or diabetes medications, make sure you notify the surgery center/hospital before the day of surgery so they can tell you which medications you should take or avoid the day of surgery. 6. No food or drink after midnight the night before surgery unless directed otherwise by surgical center/hospital staff. 7. No alcoholic beverages 24-hours prior to surgery.  No smoking 24-hours prior or 24-hours after  surgery. 8. Wear loose pants or shorts. They should be loose enough to fit over bandages, boots, and casts. 9. Don't wear slip-on shoes. Sneakers are preferred. 10. Bring your boot with you to the surgery center/hospital.  Also bring crutches or a walker if your physician has prescribed it for you.  If you do not have this equipment, it will be provided for you after surgery. 11. If you have not been contacted by the surgery center/hospital by the day before your surgery, call to confirm the date and time of your surgery. 12. Leave-time from work may vary depending on the type of surgery you have.  Appropriate arrangements should be made prior to surgery with your employer. 13. Prescriptions will be provided immediately following surgery by your doctor.  Fill these as soon as possible after surgery and take the medication as directed. Pain medications will not be refilled on weekends and must be approved by the doctor. 14. Remove nail polish on the operative foot and avoid getting pedicures prior to surgery. 15. Wash the night before surgery.  The night before surgery wash the foot and leg well with water and the antibacterial soap provided. Be sure to pay special attention to beneath the toenails and in between the toes.  Wash for at least three (3) minutes. Rinse thoroughly with water and dry well with a towel.  Perform this wash unless told not to do so by your physician.  Enclosed: 1 Ice pack (please put in freezer the night before surgery)   1 Hibiclens skin cleaner     Pre-op instructions  If you have any questions regarding the instructions, please do not hesitate to call our office.  [place]: [address], [place], [place] [address] -- [phone]  [place]: [address] [place], [place] [address] -- [phone]  [place]: [address]  [place], [place] [address] -- [phone]  [place]: [address], [address], [place], [place] [address] -- [phone]  Website:  [email or web address]    Bunion  A bunion is a bump on the base of the big toe that forms when the bones of the big toe joint move out of position. Bunions may be small at first, but they often get larger over time. They can make walking painful. What are the causes? A bunion may be caused by:  Wearing narrow or pointed shoes that force the big toe to press against the other toes.  Abnormal foot development that causes the foot to roll inward (pronate).  Changes in the foot that are caused by certain diseases, such as rheumatoid arthritis or polio.  A foot injury. What increases the risk? The following factors may make you more likely to develop this condition:  Wearing shoes that squeeze the toes together.  Having certain diseases, such as: ? Rheumatoid arthritis. ? Polio. ? Cerebral palsy.  Having family members who have bunions.  Being born with a foot deformity, such as flat feet or low arches.  Doing activities that put a lot of pressure on the feet, such as ballet dancing. What are the signs or symptoms? The main symptom of a bunion is a noticeable bump on the big toe. Other symptoms may include:  Pain.  Swelling around the big toe.  Redness and inflammation.  Thick or hardened skin on the big toe or between the toes.  Stiffness or loss of motion in the big toe.  Trouble with walking. How is this diagnosed? A bunion may be diagnosed based on your symptoms, medical history, and activities. You may have tests, such as:  X-rays. These allow your health care provider to check the position of the bones in your foot and look for damage to your joint. They also help your health care provider determine the severity of your bunion and the best way to treat it.  Joint aspiration. In this test, a sample of fluid is removed from the toe joint. This test may be done if you are in a lot of pain. It helps rule out diseases that cause painful swelling of the joints, such  as arthritis. How is this treated? Treatment depends on the severity of your symptoms. The goal of treatment is to relieve symptoms and prevent the bunion from getting worse. Your health care provider may recommend:  Wearing shoes that have a wide toe box.  Using bunion pads to cushion the affected area.  Taping your toes together to keep them in a normal position.  Placing a device inside your shoe (orthotics) to help reduce pressure on your toe joint.  Taking medicine to ease pain, inflammation, and swelling.  Applying heat or ice to the affected area.  Doing stretching exercises.  Surgery to remove scar tissue and move the toes back into their normal position. This treatment is rare. Follow these instructions at home: Managing pain, stiffness, and swelling   If directed, put ice on the painful area: ? Put ice in a plastic bag. ? Place a towel between your skin and the bag. ? Leave the ice on for 20 minutes, 2-3 times a day. Activity     If directed, apply heat to the affected area before you exercise. Use the heat source that your health care provider recommends, such as a moist heat pack or a heating pad. ? Place a towel between your skin and the heat source. ? Leave the heat on for 20-30 minutes. ? Remove the heat if your skin turns bright red. This is especially important if you are unable to feel pain, heat, or cold. You may have a greater risk of getting burned.  Do exercises as told by your health care provider. General instructions  Support your toe joint with proper footwear, shoe padding, or taping as told by your health care provider.  Take over-the-counter and prescription medicines only as told by your health care provider.  Keep all follow-up visits as told by your health care provider. This is important. Contact a health care provider if your symptoms:  Get worse.  Do not improve in 2 weeks. Get help right away if you have:  Severe pain and trouble  with walking. Summary  A bunion is a bump on the base of the big toe that forms when the bones of the big toe joint move out of position.  Bunions can make walking painful.  Treatment depends on the severity of your symptoms.  Support your toe joint with proper footwear, shoe padding, or taping as told by your health care provider. This information is not intended to replace advice given to you by your health care provider. Make sure you discuss any questions you have with your health care provider. Document Released: 01/18/2005 Document Revised: 05/31/2017 Document Reviewed: 05/31/2017 Elsevier Interactive Patient Education  2019 Elsevier Inc.  

## 2018-03-03 NOTE — Progress Notes (Signed)
Subjective:   Patient ID: Maria Frank, female   DOB: 43 y.o.   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  I allowed her to read consent form going over alternative treatments complications and the fact it could be arthritis in the joint and that ultimately this may require implant for fusion procedure.  Patient will have shortening osteotomy first metatarsal right and I gave her all instructions and she signed consent form after review and she is comfortable with this understanding total recovery can take 6 months.  I dispensed air fracture walker for the postoperative.  And explained to her all risk and she is scheduled for outpatient surgery and is encouraged to call with questions

## 2018-03-06 ENCOUNTER — Other Ambulatory Visit: Payer: Self-pay | Admitting: Internal Medicine

## 2018-03-06 DIAGNOSIS — J301 Allergic rhinitis due to pollen: Secondary | ICD-10-CM | POA: Diagnosis not present

## 2018-03-06 DIAGNOSIS — K589 Irritable bowel syndrome without diarrhea: Secondary | ICD-10-CM

## 2018-03-06 DIAGNOSIS — J3089 Other allergic rhinitis: Secondary | ICD-10-CM | POA: Diagnosis not present

## 2018-03-06 DIAGNOSIS — J3081 Allergic rhinitis due to animal (cat) (dog) hair and dander: Secondary | ICD-10-CM | POA: Diagnosis not present

## 2018-03-20 DIAGNOSIS — J3089 Other allergic rhinitis: Secondary | ICD-10-CM | POA: Diagnosis not present

## 2018-03-20 DIAGNOSIS — J301 Allergic rhinitis due to pollen: Secondary | ICD-10-CM | POA: Diagnosis not present

## 2018-03-20 DIAGNOSIS — J3081 Allergic rhinitis due to animal (cat) (dog) hair and dander: Secondary | ICD-10-CM | POA: Diagnosis not present

## 2018-03-21 DIAGNOSIS — J3081 Allergic rhinitis due to animal (cat) (dog) hair and dander: Secondary | ICD-10-CM | POA: Diagnosis not present

## 2018-03-21 DIAGNOSIS — J301 Allergic rhinitis due to pollen: Secondary | ICD-10-CM | POA: Diagnosis not present

## 2018-03-22 DIAGNOSIS — J3089 Other allergic rhinitis: Secondary | ICD-10-CM | POA: Diagnosis not present

## 2018-03-28 ENCOUNTER — Encounter: Payer: Self-pay | Admitting: Podiatry

## 2018-03-28 DIAGNOSIS — E7800 Pure hypercholesterolemia, unspecified: Secondary | ICD-10-CM | POA: Diagnosis not present

## 2018-03-28 DIAGNOSIS — M2021 Hallux rigidus, right foot: Secondary | ICD-10-CM | POA: Diagnosis not present

## 2018-04-05 ENCOUNTER — Ambulatory Visit (INDEPENDENT_AMBULATORY_CARE_PROVIDER_SITE_OTHER): Payer: BLUE CROSS/BLUE SHIELD | Admitting: Podiatry

## 2018-04-05 ENCOUNTER — Ambulatory Visit (INDEPENDENT_AMBULATORY_CARE_PROVIDER_SITE_OTHER): Payer: BLUE CROSS/BLUE SHIELD

## 2018-04-05 VITALS — Temp 97.6°F

## 2018-04-05 DIAGNOSIS — M205X1 Other deformities of toe(s) (acquired), right foot: Secondary | ICD-10-CM

## 2018-04-05 DIAGNOSIS — Z09 Encounter for follow-up examination after completed treatment for conditions other than malignant neoplasm: Secondary | ICD-10-CM

## 2018-04-05 NOTE — Progress Notes (Signed)
Subjective:   Patient ID: Maria Frank, female   DOB: 43 y.o.   MRN: [id number]   HPI Patient states doing very well with surgery with minimal discomfort noted   ROS      Objective:  Physical Exam  Neurovascular status intact with patient's right first MPJ healing very well wound edges well coapted hallux in rectus position with good range of motion with still some restriction of dorsiflexion but no crepitus     Assessment:  Doing very well shortening plantarflexed re-osteotomy first metatarsal right     Plan:  H&P condition reviewed and recommended physical therapy to improve motion along with home exercises and surgical shoe usage.  Continue compression elevation immobilization and reappoint 3 weeks or earlier if needed  X-ray indicates osteotomies healing well fixation in place significant shortening plantar flexion of the first metatarsal right

## 2018-04-13 DIAGNOSIS — M25571 Pain in right ankle and joints of right foot: Secondary | ICD-10-CM | POA: Diagnosis not present

## 2018-04-13 DIAGNOSIS — R269 Unspecified abnormalities of gait and mobility: Secondary | ICD-10-CM | POA: Diagnosis not present

## 2018-04-13 DIAGNOSIS — M62571 Muscle wasting and atrophy, not elsewhere classified, right ankle and foot: Secondary | ICD-10-CM | POA: Diagnosis not present

## 2018-04-13 DIAGNOSIS — M25674 Stiffness of right foot, not elsewhere classified: Secondary | ICD-10-CM | POA: Diagnosis not present

## 2018-04-19 ENCOUNTER — Other Ambulatory Visit: Payer: Self-pay

## 2018-04-19 ENCOUNTER — Encounter: Payer: Self-pay | Admitting: Podiatry

## 2018-04-19 ENCOUNTER — Ambulatory Visit (INDEPENDENT_AMBULATORY_CARE_PROVIDER_SITE_OTHER): Payer: BLUE CROSS/BLUE SHIELD

## 2018-04-19 ENCOUNTER — Ambulatory Visit (INDEPENDENT_AMBULATORY_CARE_PROVIDER_SITE_OTHER): Payer: BLUE CROSS/BLUE SHIELD | Admitting: Podiatry

## 2018-04-19 DIAGNOSIS — M205X1 Other deformities of toe(s) (acquired), right foot: Secondary | ICD-10-CM

## 2018-04-19 DIAGNOSIS — M62571 Muscle wasting and atrophy, not elsewhere classified, right ankle and foot: Secondary | ICD-10-CM | POA: Diagnosis not present

## 2018-04-19 DIAGNOSIS — M25571 Pain in right ankle and joints of right foot: Secondary | ICD-10-CM | POA: Diagnosis not present

## 2018-04-19 DIAGNOSIS — Z09 Encounter for follow-up examination after completed treatment for conditions other than malignant neoplasm: Secondary | ICD-10-CM

## 2018-04-19 DIAGNOSIS — M25674 Stiffness of right foot, not elsewhere classified: Secondary | ICD-10-CM | POA: Diagnosis not present

## 2018-04-19 DIAGNOSIS — R269 Unspecified abnormalities of gait and mobility: Secondary | ICD-10-CM | POA: Diagnosis not present

## 2018-04-21 DIAGNOSIS — M62571 Muscle wasting and atrophy, not elsewhere classified, right ankle and foot: Secondary | ICD-10-CM | POA: Diagnosis not present

## 2018-04-21 DIAGNOSIS — M25674 Stiffness of right foot, not elsewhere classified: Secondary | ICD-10-CM | POA: Diagnosis not present

## 2018-04-21 DIAGNOSIS — J3081 Allergic rhinitis due to animal (cat) (dog) hair and dander: Secondary | ICD-10-CM | POA: Diagnosis not present

## 2018-04-21 DIAGNOSIS — R269 Unspecified abnormalities of gait and mobility: Secondary | ICD-10-CM | POA: Diagnosis not present

## 2018-04-21 DIAGNOSIS — J301 Allergic rhinitis due to pollen: Secondary | ICD-10-CM | POA: Diagnosis not present

## 2018-04-21 DIAGNOSIS — J3089 Other allergic rhinitis: Secondary | ICD-10-CM | POA: Diagnosis not present

## 2018-04-21 DIAGNOSIS — M25571 Pain in right ankle and joints of right foot: Secondary | ICD-10-CM | POA: Diagnosis not present

## 2018-04-23 NOTE — Progress Notes (Signed)
Subjective:   Patient ID: Maria Frank, female   DOB: 43 y.o.   MRN: [id number]   HPI Patient states doing well and very happy so far with how things are going   ROS      Objective:  Physical Exam  Neurovascular status intact negative Homans sign noted right foot healed well with wound edges well coapted and good range of motion of the first MPJ with no crepitus of the joint     Assessment:  Doing well post osteotomy first metatarsal right foot     Plan:  H&P reviewed condition and at this point discussed the importance of range of motion exercises and I want her to do physical therapy with referral.  I advised on more rigid bottom shoes to wear and gradual shoe gear usage over the next few weeks  X-rays indicate osteotomies healing well with good positional component fixation in place joint congruence and open

## 2018-04-26 DIAGNOSIS — M25571 Pain in right ankle and joints of right foot: Secondary | ICD-10-CM | POA: Diagnosis not present

## 2018-04-26 DIAGNOSIS — R269 Unspecified abnormalities of gait and mobility: Secondary | ICD-10-CM | POA: Diagnosis not present

## 2018-04-26 DIAGNOSIS — J3089 Other allergic rhinitis: Secondary | ICD-10-CM | POA: Diagnosis not present

## 2018-04-26 DIAGNOSIS — J3081 Allergic rhinitis due to animal (cat) (dog) hair and dander: Secondary | ICD-10-CM | POA: Diagnosis not present

## 2018-04-26 DIAGNOSIS — M25674 Stiffness of right foot, not elsewhere classified: Secondary | ICD-10-CM | POA: Diagnosis not present

## 2018-04-26 DIAGNOSIS — J301 Allergic rhinitis due to pollen: Secondary | ICD-10-CM | POA: Diagnosis not present

## 2018-04-26 DIAGNOSIS — M62571 Muscle wasting and atrophy, not elsewhere classified, right ankle and foot: Secondary | ICD-10-CM | POA: Diagnosis not present

## 2018-05-01 DIAGNOSIS — M62571 Muscle wasting and atrophy, not elsewhere classified, right ankle and foot: Secondary | ICD-10-CM | POA: Diagnosis not present

## 2018-05-01 DIAGNOSIS — R269 Unspecified abnormalities of gait and mobility: Secondary | ICD-10-CM | POA: Diagnosis not present

## 2018-05-01 DIAGNOSIS — M25571 Pain in right ankle and joints of right foot: Secondary | ICD-10-CM | POA: Diagnosis not present

## 2018-05-01 DIAGNOSIS — M25674 Stiffness of right foot, not elsewhere classified: Secondary | ICD-10-CM | POA: Diagnosis not present

## 2018-05-03 DIAGNOSIS — M62571 Muscle wasting and atrophy, not elsewhere classified, right ankle and foot: Secondary | ICD-10-CM | POA: Diagnosis not present

## 2018-05-03 DIAGNOSIS — M25571 Pain in right ankle and joints of right foot: Secondary | ICD-10-CM | POA: Diagnosis not present

## 2018-05-03 DIAGNOSIS — M25674 Stiffness of right foot, not elsewhere classified: Secondary | ICD-10-CM | POA: Diagnosis not present

## 2018-05-03 DIAGNOSIS — L299 Pruritus, unspecified: Secondary | ICD-10-CM | POA: Diagnosis not present

## 2018-05-03 DIAGNOSIS — R269 Unspecified abnormalities of gait and mobility: Secondary | ICD-10-CM | POA: Diagnosis not present

## 2018-05-05 DIAGNOSIS — M62571 Muscle wasting and atrophy, not elsewhere classified, right ankle and foot: Secondary | ICD-10-CM | POA: Diagnosis not present

## 2018-05-05 DIAGNOSIS — M25571 Pain in right ankle and joints of right foot: Secondary | ICD-10-CM | POA: Diagnosis not present

## 2018-05-05 DIAGNOSIS — J301 Allergic rhinitis due to pollen: Secondary | ICD-10-CM | POA: Diagnosis not present

## 2018-05-05 DIAGNOSIS — J3081 Allergic rhinitis due to animal (cat) (dog) hair and dander: Secondary | ICD-10-CM | POA: Diagnosis not present

## 2018-05-05 DIAGNOSIS — R269 Unspecified abnormalities of gait and mobility: Secondary | ICD-10-CM | POA: Diagnosis not present

## 2018-05-05 DIAGNOSIS — J3089 Other allergic rhinitis: Secondary | ICD-10-CM | POA: Diagnosis not present

## 2018-05-05 DIAGNOSIS — M25674 Stiffness of right foot, not elsewhere classified: Secondary | ICD-10-CM | POA: Diagnosis not present

## 2018-05-09 DIAGNOSIS — J3081 Allergic rhinitis due to animal (cat) (dog) hair and dander: Secondary | ICD-10-CM | POA: Diagnosis not present

## 2018-05-09 DIAGNOSIS — J3089 Other allergic rhinitis: Secondary | ICD-10-CM | POA: Diagnosis not present

## 2018-05-09 DIAGNOSIS — J301 Allergic rhinitis due to pollen: Secondary | ICD-10-CM | POA: Diagnosis not present

## 2018-05-10 DIAGNOSIS — M62571 Muscle wasting and atrophy, not elsewhere classified, right ankle and foot: Secondary | ICD-10-CM | POA: Diagnosis not present

## 2018-05-10 DIAGNOSIS — R269 Unspecified abnormalities of gait and mobility: Secondary | ICD-10-CM | POA: Diagnosis not present

## 2018-05-10 DIAGNOSIS — M25571 Pain in right ankle and joints of right foot: Secondary | ICD-10-CM | POA: Diagnosis not present

## 2018-05-10 DIAGNOSIS — M25674 Stiffness of right foot, not elsewhere classified: Secondary | ICD-10-CM | POA: Diagnosis not present

## 2018-05-11 DIAGNOSIS — J3081 Allergic rhinitis due to animal (cat) (dog) hair and dander: Secondary | ICD-10-CM | POA: Diagnosis not present

## 2018-05-11 DIAGNOSIS — J3089 Other allergic rhinitis: Secondary | ICD-10-CM | POA: Diagnosis not present

## 2018-05-11 DIAGNOSIS — J301 Allergic rhinitis due to pollen: Secondary | ICD-10-CM | POA: Diagnosis not present

## 2018-05-12 DIAGNOSIS — M62571 Muscle wasting and atrophy, not elsewhere classified, right ankle and foot: Secondary | ICD-10-CM | POA: Diagnosis not present

## 2018-05-12 DIAGNOSIS — M25674 Stiffness of right foot, not elsewhere classified: Secondary | ICD-10-CM | POA: Diagnosis not present

## 2018-05-12 DIAGNOSIS — R269 Unspecified abnormalities of gait and mobility: Secondary | ICD-10-CM | POA: Diagnosis not present

## 2018-05-12 DIAGNOSIS — M25571 Pain in right ankle and joints of right foot: Secondary | ICD-10-CM | POA: Diagnosis not present

## 2018-05-17 DIAGNOSIS — M25571 Pain in right ankle and joints of right foot: Secondary | ICD-10-CM | POA: Diagnosis not present

## 2018-05-17 DIAGNOSIS — J301 Allergic rhinitis due to pollen: Secondary | ICD-10-CM | POA: Diagnosis not present

## 2018-05-17 DIAGNOSIS — M62571 Muscle wasting and atrophy, not elsewhere classified, right ankle and foot: Secondary | ICD-10-CM | POA: Diagnosis not present

## 2018-05-17 DIAGNOSIS — R269 Unspecified abnormalities of gait and mobility: Secondary | ICD-10-CM | POA: Diagnosis not present

## 2018-05-17 DIAGNOSIS — J3081 Allergic rhinitis due to animal (cat) (dog) hair and dander: Secondary | ICD-10-CM | POA: Diagnosis not present

## 2018-05-17 DIAGNOSIS — J3089 Other allergic rhinitis: Secondary | ICD-10-CM | POA: Diagnosis not present

## 2018-05-17 DIAGNOSIS — M25674 Stiffness of right foot, not elsewhere classified: Secondary | ICD-10-CM | POA: Diagnosis not present

## 2018-05-18 ENCOUNTER — Other Ambulatory Visit: Payer: Self-pay

## 2018-05-18 ENCOUNTER — Ambulatory Visit (INDEPENDENT_AMBULATORY_CARE_PROVIDER_SITE_OTHER): Payer: BLUE CROSS/BLUE SHIELD | Admitting: Podiatry

## 2018-05-18 ENCOUNTER — Encounter: Payer: Self-pay | Admitting: Podiatry

## 2018-05-18 ENCOUNTER — Ambulatory Visit (INDEPENDENT_AMBULATORY_CARE_PROVIDER_SITE_OTHER): Payer: BLUE CROSS/BLUE SHIELD

## 2018-05-18 VITALS — Temp 97.5°F

## 2018-05-18 DIAGNOSIS — M205X1 Other deformities of toe(s) (acquired), right foot: Secondary | ICD-10-CM

## 2018-05-18 DIAGNOSIS — B351 Tinea unguium: Secondary | ICD-10-CM | POA: Diagnosis not present

## 2018-05-19 DIAGNOSIS — M25674 Stiffness of right foot, not elsewhere classified: Secondary | ICD-10-CM | POA: Diagnosis not present

## 2018-05-19 DIAGNOSIS — M62571 Muscle wasting and atrophy, not elsewhere classified, right ankle and foot: Secondary | ICD-10-CM | POA: Diagnosis not present

## 2018-05-19 DIAGNOSIS — M25571 Pain in right ankle and joints of right foot: Secondary | ICD-10-CM | POA: Diagnosis not present

## 2018-05-19 DIAGNOSIS — R269 Unspecified abnormalities of gait and mobility: Secondary | ICD-10-CM | POA: Diagnosis not present

## 2018-05-22 NOTE — Progress Notes (Signed)
Subjective:   Patient ID: Maria Frank, female   DOB: 43 y.o.   MRN: [id number]   HPI Patient states doing well with my right one and I know I will have to have the left one fixed in the future   ROS      Objective:  Physical Exam  Neurovascular status intact with range of motion continues to improve first MPJ right with no discomfort when I move the joint at the current time     Assessment:  Doing well post shortening osteotomy first metatarsal right with joint open currently     Plan:  Reviewed x-ray and at this point may return to normal shoe gear and continue range of motion exercises physical therapy and reappoint to recheck again in several months or earlier if any issues were to occur  X-rays indicate osteotomies healing well fixation in place no signs of movement

## 2018-05-24 DIAGNOSIS — J3089 Other allergic rhinitis: Secondary | ICD-10-CM | POA: Diagnosis not present

## 2018-05-24 DIAGNOSIS — M25571 Pain in right ankle and joints of right foot: Secondary | ICD-10-CM | POA: Diagnosis not present

## 2018-05-24 DIAGNOSIS — M25674 Stiffness of right foot, not elsewhere classified: Secondary | ICD-10-CM | POA: Diagnosis not present

## 2018-05-24 DIAGNOSIS — M62571 Muscle wasting and atrophy, not elsewhere classified, right ankle and foot: Secondary | ICD-10-CM | POA: Diagnosis not present

## 2018-05-24 DIAGNOSIS — R269 Unspecified abnormalities of gait and mobility: Secondary | ICD-10-CM | POA: Diagnosis not present

## 2018-05-24 DIAGNOSIS — J3081 Allergic rhinitis due to animal (cat) (dog) hair and dander: Secondary | ICD-10-CM | POA: Diagnosis not present

## 2018-05-24 DIAGNOSIS — J301 Allergic rhinitis due to pollen: Secondary | ICD-10-CM | POA: Diagnosis not present

## 2018-05-26 DIAGNOSIS — M25674 Stiffness of right foot, not elsewhere classified: Secondary | ICD-10-CM | POA: Diagnosis not present

## 2018-05-26 DIAGNOSIS — R269 Unspecified abnormalities of gait and mobility: Secondary | ICD-10-CM | POA: Diagnosis not present

## 2018-05-26 DIAGNOSIS — M25571 Pain in right ankle and joints of right foot: Secondary | ICD-10-CM | POA: Diagnosis not present

## 2018-05-26 DIAGNOSIS — M62571 Muscle wasting and atrophy, not elsewhere classified, right ankle and foot: Secondary | ICD-10-CM | POA: Diagnosis not present

## 2018-05-29 ENCOUNTER — Telehealth: Payer: BLUE CROSS/BLUE SHIELD | Admitting: Physician Assistant

## 2018-05-29 DIAGNOSIS — B3731 Acute candidiasis of vulva and vagina: Secondary | ICD-10-CM

## 2018-05-29 DIAGNOSIS — B373 Candidiasis of vulva and vagina: Secondary | ICD-10-CM

## 2018-05-29 MED ORDER — FLUCONAZOLE 150 MG PO TABS: 150.0000 mg | ORAL_TABLET | Freq: Once | ORAL | 0 refills | Status: AC

## 2018-05-29 NOTE — Progress Notes (Signed)
We are sorry that you are not feeling well. Here is how we plan to help! Based on what you shared with me it looks like you: May have a yeast vaginosis  Vaginosis is an inflammation of the vagina that can result in discharge, itching and pain. The cause is usually a change in the normal balance of vaginal bacteria or an infection. Vaginosis can also result from reduced estrogen levels after menopause.  The most common causes of vaginosis are:   Bacterial vaginosis which results from an overgrowth of one on several organisms that are normally present in your vagina.   Yeast infections which are caused by a naturally occurring fungus called candida.   Vaginal atrophy (atrophic vaginosis) which results from the thinning of the vagina from reduced estrogen levels after menopause.   Trichomoniasis which is caused by a parasite and is commonly transmitted by sexual intercourse.  Factors that increase your risk of developing vaginosis include: . Medications, such as antibiotics and steroids . Uncontrolled diabetes . Use of hygiene products such as bubble bath, vaginal spray or vaginal deodorant . Douching . Wearing damp or tight-fitting clothing . Using an intrauterine device (IUD) for birth control . Hormonal changes, such as those associated with pregnancy, birth control pills or menopause . Sexual activity . Having a sexually transmitted infection  Your treatment plan is A single Diflucan (fluconazole) 150mg tablet once.  I have electronically sent this prescription into the pharmacy that you have chosen.  Be sure to take all of the medication as directed. Stop taking any medication if you develop a rash, tongue swelling or shortness of breath. Mothers who are breast feeding should consider pumping and discarding their breast milk while on these antibiotics. However, there is no consensus that infant exposure at these doses would be harmful.  Remember that medication creams can weaken latex  condoms. .   HOME CARE:  Good hygiene may prevent some types of vaginosis from recurring and may relieve some symptoms:  . Avoid baths, hot tubs and whirlpool spas. Rinse soap from your outer genital area after a shower, and dry the area well to prevent irritation. Don't use scented or harsh soaps, such as those with deodorant or antibacterial action. . Avoid irritants. These include scented tampons and pads. . Wipe from front to back after using the toilet. Doing so avoids spreading fecal bacteria to your vagina.  Other things that may help prevent vaginosis include:  . Don't douche. Your vagina doesn't require cleansing other than normal bathing. Repetitive douching disrupts the normal organisms that reside in the vagina and can actually increase your risk of vaginal infection. Douching won't clear up a vaginal infection. . Use a latex condom. Both female and female latex condoms may help you avoid infections spread by sexual contact. . Wear cotton underwear. Also wear pantyhose with a cotton crotch. If you feel comfortable without it, skip wearing underwear to bed. Yeast thrives in moist environments Your symptoms should improve in the next day or two.  GET HELP RIGHT AWAY IF:  . You have pain in your lower abdomen ( pelvic area or over your ovaries) . You develop nausea or vomiting . You develop a fever . Your discharge changes or worsens . You have persistent pain with intercourse . You develop shortness of breath, a rapid pulse, or you faint.  These symptoms could be signs of problems or infections that need to be evaluated by a medical provider now.  MAKE SURE YOU      Understand these instructions.  Will watch your condition.  Will get help right away if you are not doing well or get worse.  Your e-visit answers were reviewed by a board certified advanced clinical practitioner to complete your personal care plan. Depending upon the condition, your plan could have included  both over the counter or prescription medications. Please review your pharmacy choice to make sure that you have choses a pharmacy that is open for you to pick up any needed prescription, Your safety is important to [place]. If you have drug allergies check your prescription carefully.   You can use MyChart to ask questions about today's visit, request a non-urgent call back, or ask for a work or school excuse for 24 hours related to this e-Visit. If it has been greater than 24 hours you will need to follow up with your provider, or enter a new e-Visit to address those concerns. You will get a MyChart message within the next two days asking about your experience. I hope that your e-visit has been valuable and will speed your recovery.  Approximately 5 minutes spent documenting and reviewing patient's chart.  Konrad Felix, PA-C

## 2018-05-30 DIAGNOSIS — R269 Unspecified abnormalities of gait and mobility: Secondary | ICD-10-CM | POA: Diagnosis not present

## 2018-05-30 DIAGNOSIS — M62571 Muscle wasting and atrophy, not elsewhere classified, right ankle and foot: Secondary | ICD-10-CM | POA: Diagnosis not present

## 2018-05-30 DIAGNOSIS — M25674 Stiffness of right foot, not elsewhere classified: Secondary | ICD-10-CM | POA: Diagnosis not present

## 2018-05-30 DIAGNOSIS — M25571 Pain in right ankle and joints of right foot: Secondary | ICD-10-CM | POA: Diagnosis not present

## 2018-06-01 DIAGNOSIS — M25571 Pain in right ankle and joints of right foot: Secondary | ICD-10-CM | POA: Diagnosis not present

## 2018-06-01 DIAGNOSIS — M62571 Muscle wasting and atrophy, not elsewhere classified, right ankle and foot: Secondary | ICD-10-CM | POA: Diagnosis not present

## 2018-06-01 DIAGNOSIS — M25674 Stiffness of right foot, not elsewhere classified: Secondary | ICD-10-CM | POA: Diagnosis not present

## 2018-06-01 DIAGNOSIS — R269 Unspecified abnormalities of gait and mobility: Secondary | ICD-10-CM | POA: Diagnosis not present

## 2018-06-02 DIAGNOSIS — J3081 Allergic rhinitis due to animal (cat) (dog) hair and dander: Secondary | ICD-10-CM | POA: Diagnosis not present

## 2018-06-02 DIAGNOSIS — J3089 Other allergic rhinitis: Secondary | ICD-10-CM | POA: Diagnosis not present

## 2018-06-02 DIAGNOSIS — J301 Allergic rhinitis due to pollen: Secondary | ICD-10-CM | POA: Diagnosis not present

## 2018-06-07 DIAGNOSIS — M62571 Muscle wasting and atrophy, not elsewhere classified, right ankle and foot: Secondary | ICD-10-CM | POA: Diagnosis not present

## 2018-06-07 DIAGNOSIS — M25571 Pain in right ankle and joints of right foot: Secondary | ICD-10-CM | POA: Diagnosis not present

## 2018-06-07 DIAGNOSIS — M25674 Stiffness of right foot, not elsewhere classified: Secondary | ICD-10-CM | POA: Diagnosis not present

## 2018-06-07 DIAGNOSIS — R269 Unspecified abnormalities of gait and mobility: Secondary | ICD-10-CM | POA: Diagnosis not present

## 2018-06-12 DIAGNOSIS — J301 Allergic rhinitis due to pollen: Secondary | ICD-10-CM | POA: Diagnosis not present

## 2018-06-12 DIAGNOSIS — J3089 Other allergic rhinitis: Secondary | ICD-10-CM | POA: Diagnosis not present

## 2018-06-12 DIAGNOSIS — J3081 Allergic rhinitis due to animal (cat) (dog) hair and dander: Secondary | ICD-10-CM | POA: Diagnosis not present

## 2018-06-14 DIAGNOSIS — M62571 Muscle wasting and atrophy, not elsewhere classified, right ankle and foot: Secondary | ICD-10-CM | POA: Diagnosis not present

## 2018-06-14 DIAGNOSIS — R269 Unspecified abnormalities of gait and mobility: Secondary | ICD-10-CM | POA: Diagnosis not present

## 2018-06-14 DIAGNOSIS — M25571 Pain in right ankle and joints of right foot: Secondary | ICD-10-CM | POA: Diagnosis not present

## 2018-06-14 DIAGNOSIS — M25674 Stiffness of right foot, not elsewhere classified: Secondary | ICD-10-CM | POA: Diagnosis not present

## 2018-06-21 DIAGNOSIS — R269 Unspecified abnormalities of gait and mobility: Secondary | ICD-10-CM | POA: Diagnosis not present

## 2018-06-21 DIAGNOSIS — M25674 Stiffness of right foot, not elsewhere classified: Secondary | ICD-10-CM | POA: Diagnosis not present

## 2018-06-21 DIAGNOSIS — J3081 Allergic rhinitis due to animal (cat) (dog) hair and dander: Secondary | ICD-10-CM | POA: Diagnosis not present

## 2018-06-21 DIAGNOSIS — M25571 Pain in right ankle and joints of right foot: Secondary | ICD-10-CM | POA: Diagnosis not present

## 2018-06-21 DIAGNOSIS — M62571 Muscle wasting and atrophy, not elsewhere classified, right ankle and foot: Secondary | ICD-10-CM | POA: Diagnosis not present

## 2018-06-21 DIAGNOSIS — J3089 Other allergic rhinitis: Secondary | ICD-10-CM | POA: Diagnosis not present

## 2018-06-21 DIAGNOSIS — J301 Allergic rhinitis due to pollen: Secondary | ICD-10-CM | POA: Diagnosis not present

## 2018-06-29 DIAGNOSIS — M25674 Stiffness of right foot, not elsewhere classified: Secondary | ICD-10-CM | POA: Diagnosis not present

## 2018-06-29 DIAGNOSIS — M62571 Muscle wasting and atrophy, not elsewhere classified, right ankle and foot: Secondary | ICD-10-CM | POA: Diagnosis not present

## 2018-06-29 DIAGNOSIS — M25571 Pain in right ankle and joints of right foot: Secondary | ICD-10-CM | POA: Diagnosis not present

## 2018-06-29 DIAGNOSIS — R269 Unspecified abnormalities of gait and mobility: Secondary | ICD-10-CM | POA: Diagnosis not present

## 2018-07-05 DIAGNOSIS — L299 Pruritus, unspecified: Secondary | ICD-10-CM | POA: Diagnosis not present

## 2018-07-05 DIAGNOSIS — R269 Unspecified abnormalities of gait and mobility: Secondary | ICD-10-CM | POA: Diagnosis not present

## 2018-07-05 DIAGNOSIS — M62571 Muscle wasting and atrophy, not elsewhere classified, right ankle and foot: Secondary | ICD-10-CM | POA: Diagnosis not present

## 2018-07-05 DIAGNOSIS — M25571 Pain in right ankle and joints of right foot: Secondary | ICD-10-CM | POA: Diagnosis not present

## 2018-07-05 DIAGNOSIS — M25674 Stiffness of right foot, not elsewhere classified: Secondary | ICD-10-CM | POA: Diagnosis not present

## 2018-07-12 DIAGNOSIS — R269 Unspecified abnormalities of gait and mobility: Secondary | ICD-10-CM | POA: Diagnosis not present

## 2018-07-12 DIAGNOSIS — M62571 Muscle wasting and atrophy, not elsewhere classified, right ankle and foot: Secondary | ICD-10-CM | POA: Diagnosis not present

## 2018-07-12 DIAGNOSIS — M25674 Stiffness of right foot, not elsewhere classified: Secondary | ICD-10-CM | POA: Diagnosis not present

## 2018-07-12 DIAGNOSIS — M25571 Pain in right ankle and joints of right foot: Secondary | ICD-10-CM | POA: Diagnosis not present

## 2018-07-13 DIAGNOSIS — J3089 Other allergic rhinitis: Secondary | ICD-10-CM | POA: Diagnosis not present

## 2018-07-13 DIAGNOSIS — H1045 Other chronic allergic conjunctivitis: Secondary | ICD-10-CM | POA: Diagnosis not present

## 2018-07-13 DIAGNOSIS — J3081 Allergic rhinitis due to animal (cat) (dog) hair and dander: Secondary | ICD-10-CM | POA: Diagnosis not present

## 2018-07-13 DIAGNOSIS — J301 Allergic rhinitis due to pollen: Secondary | ICD-10-CM | POA: Diagnosis not present

## 2018-07-19 DIAGNOSIS — M25674 Stiffness of right foot, not elsewhere classified: Secondary | ICD-10-CM | POA: Diagnosis not present

## 2018-07-19 DIAGNOSIS — M62571 Muscle wasting and atrophy, not elsewhere classified, right ankle and foot: Secondary | ICD-10-CM | POA: Diagnosis not present

## 2018-07-19 DIAGNOSIS — M25571 Pain in right ankle and joints of right foot: Secondary | ICD-10-CM | POA: Diagnosis not present

## 2018-07-19 DIAGNOSIS — R269 Unspecified abnormalities of gait and mobility: Secondary | ICD-10-CM | POA: Diagnosis not present

## 2018-07-27 ENCOUNTER — Ambulatory Visit (INDEPENDENT_AMBULATORY_CARE_PROVIDER_SITE_OTHER): Payer: BC Managed Care – PPO

## 2018-07-27 ENCOUNTER — Other Ambulatory Visit: Payer: Self-pay

## 2018-07-27 ENCOUNTER — Ambulatory Visit (INDEPENDENT_AMBULATORY_CARE_PROVIDER_SITE_OTHER): Payer: BC Managed Care – PPO | Admitting: Podiatry

## 2018-07-27 ENCOUNTER — Encounter: Payer: Self-pay | Admitting: Podiatry

## 2018-07-27 VITALS — Temp 97.8°F

## 2018-07-27 DIAGNOSIS — M205X2 Other deformities of toe(s) (acquired), left foot: Secondary | ICD-10-CM | POA: Diagnosis not present

## 2018-07-27 DIAGNOSIS — M62571 Muscle wasting and atrophy, not elsewhere classified, right ankle and foot: Secondary | ICD-10-CM | POA: Diagnosis not present

## 2018-07-27 DIAGNOSIS — R269 Unspecified abnormalities of gait and mobility: Secondary | ICD-10-CM | POA: Diagnosis not present

## 2018-07-27 DIAGNOSIS — M25571 Pain in right ankle and joints of right foot: Secondary | ICD-10-CM | POA: Diagnosis not present

## 2018-07-27 DIAGNOSIS — M205X1 Other deformities of toe(s) (acquired), right foot: Secondary | ICD-10-CM

## 2018-07-27 DIAGNOSIS — M25674 Stiffness of right foot, not elsewhere classified: Secondary | ICD-10-CM | POA: Diagnosis not present

## 2018-07-30 NOTE — Progress Notes (Signed)
Subjective:   Patient ID: Maria Frank, female   DOB: 43 y.o.   MRN: [id number]   HPI Patient states my right foot is doing great but my left one is giving me a problem.  Patient states that she is very happy with the surgery and knows she is can need to get it done on her left one   ROS      Objective:  Physical Exam  Neurovascular status intact negative Homans sign noted with patient's right first MPJ doing very well with good range of motion no crepitus of the joint and quite a bit of discomfort first MPJ left with prominence of the joint surface and pain     Assessment:  Doing well post hallux limitus surgery right with hallux limitus left and reduced range of motion with elongated metatarsal     Plan:  H&P x-rays of both reviewed and discussed correction of the left going over a shortening plantarflexed 3 osteotomy and explained that just because one did so well no guarantee the other 1 will.  Patient wants surgery and at this time she did sign consent form after extensive review and is scheduled for outpatient procedure at her convenience and was encouraged to call with questions.  She is discharged from the right one at the current time  X-ray indicates there is a satisfactory shortening plantarflexed first metatarsal right with fixation in place and joint open congruence with an elongated elevated first metatarsal left with small spur formation

## 2018-08-15 DIAGNOSIS — J301 Allergic rhinitis due to pollen: Secondary | ICD-10-CM | POA: Diagnosis not present

## 2018-08-15 DIAGNOSIS — J3081 Allergic rhinitis due to animal (cat) (dog) hair and dander: Secondary | ICD-10-CM | POA: Diagnosis not present

## 2018-08-15 DIAGNOSIS — J3089 Other allergic rhinitis: Secondary | ICD-10-CM | POA: Diagnosis not present

## 2018-08-22 DIAGNOSIS — J3089 Other allergic rhinitis: Secondary | ICD-10-CM | POA: Diagnosis not present

## 2018-08-22 DIAGNOSIS — J301 Allergic rhinitis due to pollen: Secondary | ICD-10-CM | POA: Diagnosis not present

## 2018-08-22 DIAGNOSIS — J3081 Allergic rhinitis due to animal (cat) (dog) hair and dander: Secondary | ICD-10-CM | POA: Diagnosis not present

## 2018-08-29 DIAGNOSIS — J301 Allergic rhinitis due to pollen: Secondary | ICD-10-CM | POA: Diagnosis not present

## 2018-08-29 DIAGNOSIS — J3081 Allergic rhinitis due to animal (cat) (dog) hair and dander: Secondary | ICD-10-CM | POA: Diagnosis not present

## 2018-08-29 DIAGNOSIS — J3089 Other allergic rhinitis: Secondary | ICD-10-CM | POA: Diagnosis not present

## 2018-09-06 DIAGNOSIS — J301 Allergic rhinitis due to pollen: Secondary | ICD-10-CM | POA: Diagnosis not present

## 2018-09-06 DIAGNOSIS — J3089 Other allergic rhinitis: Secondary | ICD-10-CM | POA: Diagnosis not present

## 2018-09-06 DIAGNOSIS — J3081 Allergic rhinitis due to animal (cat) (dog) hair and dander: Secondary | ICD-10-CM | POA: Diagnosis not present

## 2018-09-13 DIAGNOSIS — J3089 Other allergic rhinitis: Secondary | ICD-10-CM | POA: Diagnosis not present

## 2018-09-13 DIAGNOSIS — J301 Allergic rhinitis due to pollen: Secondary | ICD-10-CM | POA: Diagnosis not present

## 2018-09-13 DIAGNOSIS — J3081 Allergic rhinitis due to animal (cat) (dog) hair and dander: Secondary | ICD-10-CM | POA: Diagnosis not present

## 2018-09-21 DIAGNOSIS — J3089 Other allergic rhinitis: Secondary | ICD-10-CM | POA: Diagnosis not present

## 2018-09-21 DIAGNOSIS — J301 Allergic rhinitis due to pollen: Secondary | ICD-10-CM | POA: Diagnosis not present

## 2018-09-21 DIAGNOSIS — J3081 Allergic rhinitis due to animal (cat) (dog) hair and dander: Secondary | ICD-10-CM | POA: Diagnosis not present

## 2018-09-26 ENCOUNTER — Other Ambulatory Visit: Payer: Self-pay | Admitting: Internal Medicine

## 2018-09-26 DIAGNOSIS — Z1231 Encounter for screening mammogram for malignant neoplasm of breast: Secondary | ICD-10-CM

## 2018-09-26 DIAGNOSIS — J3081 Allergic rhinitis due to animal (cat) (dog) hair and dander: Secondary | ICD-10-CM | POA: Diagnosis not present

## 2018-09-26 DIAGNOSIS — J3089 Other allergic rhinitis: Secondary | ICD-10-CM | POA: Diagnosis not present

## 2018-09-26 DIAGNOSIS — J301 Allergic rhinitis due to pollen: Secondary | ICD-10-CM | POA: Diagnosis not present

## 2018-09-28 ENCOUNTER — Other Ambulatory Visit: Payer: Self-pay | Admitting: Internal Medicine

## 2018-09-28 DIAGNOSIS — K589 Irritable bowel syndrome without diarrhea: Secondary | ICD-10-CM

## 2018-10-02 DIAGNOSIS — R3121 Asymptomatic microscopic hematuria: Secondary | ICD-10-CM | POA: Diagnosis not present

## 2018-10-02 DIAGNOSIS — N281 Cyst of kidney, acquired: Secondary | ICD-10-CM | POA: Diagnosis not present

## 2018-10-16 DIAGNOSIS — N281 Cyst of kidney, acquired: Secondary | ICD-10-CM | POA: Diagnosis not present

## 2018-10-16 DIAGNOSIS — R3121 Asymptomatic microscopic hematuria: Secondary | ICD-10-CM | POA: Diagnosis not present

## 2018-10-17 DIAGNOSIS — J3089 Other allergic rhinitis: Secondary | ICD-10-CM | POA: Diagnosis not present

## 2018-10-17 DIAGNOSIS — J301 Allergic rhinitis due to pollen: Secondary | ICD-10-CM | POA: Diagnosis not present

## 2018-10-17 DIAGNOSIS — J3081 Allergic rhinitis due to animal (cat) (dog) hair and dander: Secondary | ICD-10-CM | POA: Diagnosis not present

## 2018-10-27 ENCOUNTER — Telehealth: Payer: Self-pay | Admitting: *Deleted

## 2018-10-27 NOTE — Telephone Encounter (Signed)
"  I'm supposed to be calling back to schedule a bunionectomy in November.  Call me back."

## 2018-10-31 DIAGNOSIS — J3081 Allergic rhinitis due to animal (cat) (dog) hair and dander: Secondary | ICD-10-CM | POA: Diagnosis not present

## 2018-10-31 DIAGNOSIS — J3089 Other allergic rhinitis: Secondary | ICD-10-CM | POA: Diagnosis not present

## 2018-10-31 DIAGNOSIS — J301 Allergic rhinitis due to pollen: Secondary | ICD-10-CM | POA: Diagnosis not present

## 2018-11-02 DIAGNOSIS — J301 Allergic rhinitis due to pollen: Secondary | ICD-10-CM | POA: Diagnosis not present

## 2018-11-02 DIAGNOSIS — J3081 Allergic rhinitis due to animal (cat) (dog) hair and dander: Secondary | ICD-10-CM | POA: Diagnosis not present

## 2018-11-03 DIAGNOSIS — J3089 Other allergic rhinitis: Secondary | ICD-10-CM | POA: Diagnosis not present

## 2018-11-03 NOTE — Telephone Encounter (Signed)
"  I'm calling to schedule my surgery with Dr. Paulla Dolly.  He did surgery on my other foot in February."  I can schedule you a date but you're going to need to schedule an appointment to see Dr. Paulla Dolly for a consultation.  "I need to see him even though I signed some papers in February?"  Yes, that consent form was only for your right foot.  You need to sign for you left foot now.  "Okay, I understand."  Do you have a date that you like?  "I'd like to do it in November."  Dr. Paulla Dolly can do it on December 05, 2018.  Is that date okay?  "Yes, that will be fine."  Would you like me to transfer you to an appointment scheduler?  "Yes, that would be great."  I transferred her to [place].

## 2018-11-07 ENCOUNTER — Telehealth: Payer: Self-pay | Admitting: *Deleted

## 2018-11-07 NOTE — Telephone Encounter (Signed)
Called and spoke with Maria Frank from [facility] and the representative stated that the procedure code 28296 is a non covered service under the patients insurance plan and the reference number is [id number]. [name]

## 2018-11-08 ENCOUNTER — Telehealth: Payer: Self-pay | Admitting: *Deleted

## 2018-11-08 ENCOUNTER — Telehealth: Payer: Self-pay | Admitting: Podiatry

## 2018-11-08 NOTE — Telephone Encounter (Signed)
Left voicemail letting pt knows she needs to come in a week or two prior to 12/05/2018 to go over and sign her consent forms. Told pt to call back and schedule an appointment with one of the appointment schedulers.

## 2018-11-08 NOTE — Telephone Encounter (Signed)
Called and spoke with Colon Branch at [facility] and the representative stated that there is no pre-cert or prior authorization required for the procedure code 28306 and the reference number is [id number]. [name]

## 2018-11-09 ENCOUNTER — Telehealth: Payer: Self-pay | Admitting: *Deleted

## 2018-11-09 NOTE — Telephone Encounter (Signed)
"  I'm calling to reschedule my surgery that's scheduled for November 3."  Do you have a date that you would like to reschedule it to?  "Can he do it a week later on November 10?"  Yes, he can.  I will reschedule it from 12/05/2018 to 12/12/2018.  I called and left a message for Maria Frank at [facility] asking her to reschedule Maria Frank from 12/05/2018 to 12/12/2018.

## 2018-11-13 ENCOUNTER — Other Ambulatory Visit: Payer: Self-pay

## 2018-11-13 ENCOUNTER — Ambulatory Visit
Admission: RE | Admit: 2018-11-13 | Discharge: 2018-11-13 | Disposition: A | Discharge status: Home / Self Care | Payer: BC Managed Care – PPO | Source: Ambulatory Visit | Attending: Internal Medicine | Admitting: Internal Medicine

## 2018-11-13 DIAGNOSIS — Z1231 Encounter for screening mammogram for malignant neoplasm of breast: Secondary | ICD-10-CM

## 2018-11-20 ENCOUNTER — Telehealth: Payer: Self-pay | Admitting: Podiatry

## 2018-11-20 NOTE — Telephone Encounter (Signed)
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  Hospital - [facility]      In-Network Copay Coinsurance Authorization Required Not Applicable 51%  No Messages: OUT-OF-POCKET 100 PERCENT THEREAFTER  Per Jazmine M no pre cert or referrals are required. Ref# [id number] (CPT code 28296-[place] Bunionectomy Bi-Planar Lt and DX code M20.10-Hallux Abducto Valgus are not billable under this plan, per [name] C per Colon Branch.)

## 2018-11-21 DIAGNOSIS — J3081 Allergic rhinitis due to animal (cat) (dog) hair and dander: Secondary | ICD-10-CM | POA: Diagnosis not present

## 2018-11-21 DIAGNOSIS — J3089 Other allergic rhinitis: Secondary | ICD-10-CM | POA: Diagnosis not present

## 2018-11-21 DIAGNOSIS — J301 Allergic rhinitis due to pollen: Secondary | ICD-10-CM | POA: Diagnosis not present

## 2018-11-22 ENCOUNTER — Ambulatory Visit (INDEPENDENT_AMBULATORY_CARE_PROVIDER_SITE_OTHER): Payer: BC Managed Care – PPO | Admitting: Podiatry

## 2018-11-22 ENCOUNTER — Ambulatory Visit (INDEPENDENT_AMBULATORY_CARE_PROVIDER_SITE_OTHER): Payer: BC Managed Care – PPO

## 2018-11-22 ENCOUNTER — Encounter: Payer: Self-pay | Admitting: Podiatry

## 2018-11-22 ENCOUNTER — Other Ambulatory Visit: Payer: Self-pay

## 2018-11-22 DIAGNOSIS — M2011 Hallux valgus (acquired), right foot: Secondary | ICD-10-CM | POA: Diagnosis not present

## 2018-11-22 DIAGNOSIS — M2012 Hallux valgus (acquired), left foot: Secondary | ICD-10-CM

## 2018-11-22 NOTE — Patient Instructions (Addendum)
Pre-Operative Instructions  Congratulations, you have decided to take an important step towards improving your quality of life.  You can be assured that the doctors and staff at [facility] will be with you every step of the way.  Here are some important things you should know:  1. Plan to be at the surgery center/hospital at least 1 (one) hour prior to your scheduled time, unless otherwise directed by the surgical center/hospital staff.  You must have a responsible adult accompany you, remain during the surgery and drive you home.  Make sure you have directions to the surgical center/hospital to ensure you arrive on time. 2. If you are having surgery at [facility] or [facility], you will need a copy of your medical history and physical form from your family physician within one month prior to the date of surgery. We will give you a form for your primary physician to complete.  3. We make every effort to accommodate the date you request for surgery.  However, there are times where surgery dates or times have to be moved.  We will contact you as soon as possible if a change in schedule is required.   4. No aspirin/ibuprofen for one week before surgery.  If you are on aspirin, any non-steroidal anti-inflammatory medications (Mobic, Aleve, Ibuprofen) should not be taken seven (7) days prior to your surgery.  You make take Tylenol for pain prior to surgery.  5. Medications - If you are taking daily heart and blood pressure medications, seizure, reflux, allergy, asthma, anxiety, pain or diabetes medications, make sure you notify the surgery center/hospital before the day of surgery so they can tell you which medications you should take or avoid the day of surgery. 6. No food or drink after midnight the night before surgery unless directed otherwise by surgical center/hospital staff. 7. No alcoholic beverages 24-hours prior to surgery.  No smoking 24-hours prior or 24-hours after  surgery. 8. Wear loose pants or shorts. They should be loose enough to fit over bandages, boots, and casts. 9. Don't wear slip-on shoes. Sneakers are preferred. 10. Bring your boot with you to the surgery center/hospital.  Also bring crutches or a walker if your physician has prescribed it for you.  If you do not have this equipment, it will be provided for you after surgery. 11. If you have not been contacted by the surgery center/hospital by the day before your surgery, call to confirm the date and time of your surgery. 12. Leave-time from work may vary depending on the type of surgery you have.  Appropriate arrangements should be made prior to surgery with your employer. 13. Prescriptions will be provided immediately following surgery by your doctor.  Fill these as soon as possible after surgery and take the medication as directed. Pain medications will not be refilled on weekends and must be approved by the doctor. 14. Remove nail polish on the operative foot and avoid getting pedicures prior to surgery. 15. Wash the night before surgery.  The night before surgery wash the foot and leg well with water and the antibacterial soap provided. Be sure to pay special attention to beneath the toenails and in between the toes.  Wash for at least three (3) minutes. Rinse thoroughly with water and dry well with a towel.  Perform this wash unless told not to do so by your physician.  Enclosed: 1 Ice pack (please put in freezer the night before surgery)   1 Hibiclens skin cleaner     Pre-op instructions  If you have any questions regarding the instructions, please do not hesitate to call our office.  [place]: [address], [place], [place] [address] -- [place]: [address] [place], [place] [address] -- [phone]  [place]: [address], [place] [address] -- [phone]   Website: [email or web address]      Soak Instructions    THE DAY AFTER THE  PROCEDURE  Place 1/4 cup of epsom salts in a quart of warm tap water.  Submerge your foot or feet with outer bandage intact for the initial soak; this will allow the bandage to become moist and wet for easy lift off.  Once you remove your bandage, continue to soak in the solution for 20 minutes.  This soak should be done twice a day.  Next, remove your foot or feet from solution, blot dry the affected area and cover.  You may use a band aid large enough to cover the area or use gauze and tape.  Apply other medications to the area as directed by the doctor such as polysporin neosporin.  IF YOUR SKIN BECOMES IRRITATED WHILE USING THESE INSTRUCTIONS, IT IS OKAY TO SWITCH TO  WHITE VINEGAR AND WATER. Or you may use antibacterial soap and water to keep the toe clean  Monitor for any signs/symptoms of infection. Call the office immediately if any occur or go directly to the emergency room. Call with any questions/concerns.    Pace Instructions-Post Nail Surgery  You have had your ingrown toenail and root treated with a chemical.  This chemical causes a burn that will drain and ooze like a blister.  This can drain for 6-8 weeks or longer.  It is important to keep this area clean, covered, and follow the soaking instructions dispensed at the time of your surgery.  This area will eventually dry and form a scab.  Once the scab forms you no longer need to soak or apply a dressing.  If at any time you experience an increase in pain, redness, swelling, or drainage, you should contact the office as soon as possible.

## 2018-11-22 NOTE — Progress Notes (Signed)
Subjective:   Patient ID: Timoteo Ace, female   DOB: 43 y.o.   MRN: [id number]   HPI patient presents stating of ready to get the left foot fax not very pleased with how I done so far and I might have an ingrown toenail my left toe that has been sore   ROS      Objective:  Physical Exam  Neurovascular status intact with patient's left foot showing reduced range of motion spurring with better control the right with range of motion good and no indications of significant pathology right.  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  I recommended plantar flexor shortening osteotomy patient wants surgery and at this point I allowed her to read consent form going over alternative treatments complications associated with this the fact there is no long-term guarantees and just because 1 did so well no guarantee the other one will and she understands completely wanting surgery and we discussed possible ingrown toenail correction at postoperative visit.  Patient signed consent form given all preoperative instructions and is scheduled for outpatient surgery and is encouraged to call with questions concerns which may arise prior to procedure

## 2018-12-11 DIAGNOSIS — J3081 Allergic rhinitis due to animal (cat) (dog) hair and dander: Secondary | ICD-10-CM | POA: Diagnosis not present

## 2018-12-11 DIAGNOSIS — J301 Allergic rhinitis due to pollen: Secondary | ICD-10-CM | POA: Diagnosis not present

## 2018-12-11 DIAGNOSIS — J3089 Other allergic rhinitis: Secondary | ICD-10-CM | POA: Diagnosis not present

## 2018-12-12 ENCOUNTER — Encounter: Payer: Self-pay | Admitting: Podiatry

## 2018-12-12 DIAGNOSIS — M2022 Hallux rigidus, left foot: Secondary | ICD-10-CM

## 2018-12-12 DIAGNOSIS — R0683 Snoring: Secondary | ICD-10-CM | POA: Diagnosis not present

## 2018-12-13 ENCOUNTER — Telehealth: Payer: Self-pay | Admitting: *Deleted

## 2018-12-13 MED ORDER — HYDROCODONE-ACETAMINOPHEN 10-325 MG PO TABS: 1.0000 | ORAL_TABLET | ORAL | 0 refills | Status: AC | PRN

## 2018-12-13 NOTE — Telephone Encounter (Signed)
I called Maria Frank and she states she had surgery in February and had a reaction to the oxycodone, but was able to take hydrocodone. I asked Maria Frank for the reaction she was having to the oxycodone and she described shaking movement, nausea and dizziness. I told Maria Frank I would inform Dr. Paulla Dolly and once she or husband had given the prescription to me at the [facility], I would have Dr. Paulla Dolly send the hydrocodone rx to the [facility]. Maria Frank states understanding and will have husband bring the remaining oxycodone.

## 2018-12-13 NOTE — Telephone Encounter (Signed)
Dr. Paulla Dolly sent the [place] to the [facility]. I informed pt of Dr. Mellody Drown orders.

## 2018-12-13 NOTE — Addendum Note (Signed)
Addended by: Wallene Huh on: 12/13/2018 03:24 PM   Modules accepted: Orders

## 2018-12-13 NOTE — Telephone Encounter (Signed)
Pt states the pain medication has not been called to her pharmacy.

## 2018-12-13 NOTE — Telephone Encounter (Signed)
Pt called states she has some questions concerning the bunion surgery 12/12/2018.

## 2018-12-13 NOTE — Telephone Encounter (Signed)
Pt's husband Ronalee Belts brought in #15 Oycodone 10/325mg . I counted #15 and Sheppard Evens, CMA counted #15 and she watched me crush the pills and dispose in the sharps container.

## 2018-12-18 ENCOUNTER — Encounter: Payer: Self-pay | Admitting: Podiatry

## 2018-12-18 ENCOUNTER — Ambulatory Visit (INDEPENDENT_AMBULATORY_CARE_PROVIDER_SITE_OTHER): Payer: BC Managed Care – PPO

## 2018-12-18 ENCOUNTER — Other Ambulatory Visit: Payer: Self-pay

## 2018-12-18 ENCOUNTER — Ambulatory Visit (INDEPENDENT_AMBULATORY_CARE_PROVIDER_SITE_OTHER): Payer: BC Managed Care – PPO | Admitting: Podiatry

## 2018-12-18 DIAGNOSIS — M205X2 Other deformities of toe(s) (acquired), left foot: Secondary | ICD-10-CM

## 2018-12-19 DIAGNOSIS — B001 Herpesviral vesicular dermatitis: Secondary | ICD-10-CM | POA: Insufficient documentation

## 2018-12-19 NOTE — Progress Notes (Signed)
Subjective:    Patient ID: Maria Frank, female    DOB: 1975-12-01, 43 y.o.   MRN: [id number]  HPI The patient is here for an acute visit.   Cold sores:   She has been using her husbands medication for cold sores. She has more recurrences this year.  In the past 6 months she has at least one a month.  It is usually on her lips.  The medication works well.       Medications and allergies reviewed with patient and updated if appropriate.  Patient Active Problem List   Diagnosis Date Noted  . 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 Foot pain, bilateral 02/06/2018  . Itchy skin 02/06/2018  . IBS (irritable bowel syndrome) 02/06/2018  . Obesity (BMI 30.0-34.9) 02/06/2018  . Fatigue 04/13/2017  . Onychomycosis 03/04/2015  . Renal cyst 03/04/2015  . Osteopenia 03/04/2015  . Insomnia 03/04/2015  . IUD (intrauterine device) in place 12/01/2012  . Trivial mitral regurgitation 07/03/2010  . Hyperlipidemia 03/30/2010  . ALLERGIC RHINITIS 03/30/2010  . Headache 03/30/2010    Current Outpatient Medications on File Prior to Visit  Medication Sig Dispense Refill  . cetirizine (ZYRTEC) 10 MG tablet Take 1 tablet (10 mg total) by mouth daily. 30 tablet 11  . dicyclomine (BENTYL) 10 MG capsule TAKE 2 CAPSULES BY MOUTH 4 TIMES DAILY AS NEEDED FOR SPASMS. 90 capsule 1  . EPIPEN 2-PAK 0.3 MG/0.3ML SOAJ injection Inject 0.3 mLs (0.3 mg total) into the skin once as needed. 1 Device   . mometasone (NASONEX) 50 MCG/ACT nasal spray 2 sprays by Nasal route daily.      . ondansetron (ZOFRAN) 4 MG tablet Take 4 mg by mouth every 6 (six) hours as needed.    Marland Kitchen PAZEO 0.7 % SOLN PLACE 1 DROP INTO BOTH EYES EVERY DAY  6  . Polyethylene Glycol 3350 (MIRALAX PO) Take by mouth.    . terbinafine (LAMISIL) 250 MG tablet Take 1 tablet (250 mg total) by mouth daily. 90 tablet 0   No current facility-administered medications on file prior to visit.     Past Medical History:  Diagnosis Date  . ALLERGIC RHINITIS    . ARTHRITIS   . CHICKENPOX, HX OF   . DEPRESSION   . Heart murmur   . HYPERLIPIDEMIA   . IBS (irritable bowel syndrome)   . Migraine     Past Surgical History:  Procedure Laterality Date  . COLONOSCOPY  11/2016  . NO PAST SURGERIES      Social History   Socioeconomic History  . Marital status: Married    Spouse name: Not on file  . Number of children: Not on file  . Years of education: Not on file  . Highest education level: Not on file  Occupational History  . Not on file  Social Needs  . Financial resource strain: Not on file  . Food insecurity    Worry: Not on file    Inability: Not on file  . Transportation needs    Medical: Not on file    Non-medical: Not on file  Tobacco Use  . Smoking status: Never Smoker  . Smokeless tobacco: Never Used  Substance and Sexual Activity  . Alcohol use: Yes    Alcohol/week: 0.0 standard drinks    Comment: Social  . Drug use: No  . Sexual activity: Yes    Partners: Male    Birth control/protection: None, I.U.D.  Lifestyle  . Physical activity  Days per week: Not on file    Minutes per session: Not on file  . Stress: Not on file  Relationships  . Social Herbalist on phone: Not on file    Gets together: Not on file    Attends religious service: Not on file    Active member of club or organization: Not on file    Attends meetings of clubs or organizations: Not on file    Relationship status: Not on file  Other Topics Concern  . Not on file  Social History Narrative   Married since 2004, but separated 10/2012 from spouse. Originally from [place], in [place] since 2008. 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 Hypertension Father   . Cardiomyopathy Father   . Sleep apnea Father   . Cancer Mother        pancreatic, liver and renal   . Osteoporosis Mother   . Renal cancer Mother   . Hypertension Paternal Grandfather   . Arthritis Other        parent &  grandparents  . Breast cancer Maternal Aunt 60  . Breast cancer Cousin 30  . Asthma Sister   . Sleep apnea Brother   . Asthma Sister   . Sleep apnea Brother   . Stomach cancer Neg Hx   . Rectal cancer Neg Hx   . Esophageal cancer Neg Hx   . Colon cancer Neg Hx     Review of Systems Per HPI     Objective:   Vitals:   12/20/18 0809  BP: 128/84  Pulse: 95  Resp: 16  Temp: 98.1 F (36.7 C)  SpO2: 99%   BP Readings from Last 3 Encounters:  12/20/18 128/84  02/16/18 127/85  02/06/18 132/80   Wt Readings from Last 3 Encounters:  12/20/18 186 lb (84.4 kg)  02/06/18 190 lb 6.4 oz (86.4 kg)  04/13/17 183 lb (83 kg)   Body mass index is 32.18 kg/m.   Physical Exam Constitutional:      General: She is not in acute distress.    Appearance: Normal appearance.  HENT:     Head: Normocephalic and atraumatic.     Mouth/Throat:     Comments: Deferred since there are no lesions and she is wearing a mask Skin:    General: Skin is warm and dry.  Neurological:     Mental Status: She is alert.            Assessment & Plan:    See Problem List for Assessment and Plan of chronic medical problems.

## 2018-12-20 ENCOUNTER — Other Ambulatory Visit: Payer: Self-pay

## 2018-12-20 ENCOUNTER — Encounter: Payer: Self-pay | Admitting: Internal Medicine

## 2018-12-20 ENCOUNTER — Ambulatory Visit (INDEPENDENT_AMBULATORY_CARE_PROVIDER_SITE_OTHER): Payer: BC Managed Care – PPO | Admitting: Internal Medicine

## 2018-12-20 DIAGNOSIS — B001 Herpesviral vesicular dermatitis: Secondary | ICD-10-CM | POA: Diagnosis not present

## 2018-12-20 MED ORDER — VALACYCLOVIR HCL 1 G PO TABS: ORAL_TABLET | ORAL | 5 refills | Status: DC

## 2018-12-20 NOTE — Assessment & Plan Note (Signed)
Recurrent - about once a month Has been taking valtrex - her husband's and it works well Will give her own medication Valtrex 2000 mg q 12 hr x 1 day for outbreaks

## 2018-12-20 NOTE — Patient Instructions (Signed)
   Medications reviewed and updated.  Changes include :   Valtrex as needed  Your prescription(s) have been submitted to your pharmacy. Please take as directed and contact our office if you believe you are having problem(s) with the medication(s).

## 2018-12-20 NOTE — Progress Notes (Signed)
Subjective:   Patient ID: Maria Frank, female   DOB: 43 y.o.   MRN: [id number]   HPI States doing well and looking forward to physical therapy to improve the motion of my joint.  1 week after having shortening osteotomy left   ROS      Objective:  Physical Exam  Neurovascular status intact negative Bevelyn Buckles' sign noted with patient's left first MPJ showing good motion good position with mild restriction secondary to the long-term hallux limitus condition     Assessment:  Improving secondary to biplanar osteotomy first metatarsal of the left foot     Plan:  H&P reviewed condition and recommended the continuation of conservative care with immobilization elevation and dressing applied today.  We will begin physical therapy and I am scheduling her for this and patient will gradually increase activity over the next few weeks and begin surgical shoe usage  X-rays indicate osteotomy looks good good positional component no signs of pathology

## 2018-12-27 DIAGNOSIS — M25675 Stiffness of left foot, not elsewhere classified: Secondary | ICD-10-CM | POA: Diagnosis not present

## 2018-12-27 DIAGNOSIS — M25572 Pain in left ankle and joints of left foot: Secondary | ICD-10-CM | POA: Diagnosis not present

## 2018-12-27 DIAGNOSIS — R269 Unspecified abnormalities of gait and mobility: Secondary | ICD-10-CM | POA: Diagnosis not present

## 2018-12-27 DIAGNOSIS — M25475 Effusion, left foot: Secondary | ICD-10-CM | POA: Diagnosis not present

## 2019-01-01 ENCOUNTER — Encounter: Payer: BC Managed Care – PPO | Admitting: Podiatry

## 2019-01-01 DIAGNOSIS — M25475 Effusion, left foot: Secondary | ICD-10-CM | POA: Diagnosis not present

## 2019-01-01 DIAGNOSIS — M25675 Stiffness of left foot, not elsewhere classified: Secondary | ICD-10-CM | POA: Diagnosis not present

## 2019-01-01 DIAGNOSIS — R269 Unspecified abnormalities of gait and mobility: Secondary | ICD-10-CM | POA: Diagnosis not present

## 2019-01-01 DIAGNOSIS — M25572 Pain in left ankle and joints of left foot: Secondary | ICD-10-CM | POA: Diagnosis not present

## 2019-01-03 DIAGNOSIS — R269 Unspecified abnormalities of gait and mobility: Secondary | ICD-10-CM | POA: Diagnosis not present

## 2019-01-03 DIAGNOSIS — M25572 Pain in left ankle and joints of left foot: Secondary | ICD-10-CM | POA: Diagnosis not present

## 2019-01-03 DIAGNOSIS — M25475 Effusion, left foot: Secondary | ICD-10-CM | POA: Diagnosis not present

## 2019-01-03 DIAGNOSIS — M25675 Stiffness of left foot, not elsewhere classified: Secondary | ICD-10-CM | POA: Diagnosis not present

## 2019-01-05 DIAGNOSIS — H532 Diplopia: Secondary | ICD-10-CM | POA: Diagnosis not present

## 2019-01-08 DIAGNOSIS — R269 Unspecified abnormalities of gait and mobility: Secondary | ICD-10-CM | POA: Diagnosis not present

## 2019-01-08 DIAGNOSIS — M25572 Pain in left ankle and joints of left foot: Secondary | ICD-10-CM | POA: Diagnosis not present

## 2019-01-08 DIAGNOSIS — M25675 Stiffness of left foot, not elsewhere classified: Secondary | ICD-10-CM | POA: Diagnosis not present

## 2019-01-08 DIAGNOSIS — M25475 Effusion, left foot: Secondary | ICD-10-CM | POA: Diagnosis not present

## 2019-01-10 DIAGNOSIS — M25675 Stiffness of left foot, not elsewhere classified: Secondary | ICD-10-CM | POA: Diagnosis not present

## 2019-01-10 DIAGNOSIS — M25572 Pain in left ankle and joints of left foot: Secondary | ICD-10-CM | POA: Diagnosis not present

## 2019-01-10 DIAGNOSIS — M25475 Effusion, left foot: Secondary | ICD-10-CM | POA: Diagnosis not present

## 2019-01-10 DIAGNOSIS — R269 Unspecified abnormalities of gait and mobility: Secondary | ICD-10-CM | POA: Diagnosis not present

## 2019-01-18 DIAGNOSIS — R269 Unspecified abnormalities of gait and mobility: Secondary | ICD-10-CM | POA: Diagnosis not present

## 2019-01-18 DIAGNOSIS — M25572 Pain in left ankle and joints of left foot: Secondary | ICD-10-CM | POA: Diagnosis not present

## 2019-01-18 DIAGNOSIS — M25675 Stiffness of left foot, not elsewhere classified: Secondary | ICD-10-CM | POA: Diagnosis not present

## 2019-01-18 DIAGNOSIS — M25475 Effusion, left foot: Secondary | ICD-10-CM | POA: Diagnosis not present

## 2019-01-22 DIAGNOSIS — M25475 Effusion, left foot: Secondary | ICD-10-CM | POA: Diagnosis not present

## 2019-01-22 DIAGNOSIS — M25572 Pain in left ankle and joints of left foot: Secondary | ICD-10-CM | POA: Diagnosis not present

## 2019-01-22 DIAGNOSIS — M25675 Stiffness of left foot, not elsewhere classified: Secondary | ICD-10-CM | POA: Diagnosis not present

## 2019-01-22 DIAGNOSIS — R269 Unspecified abnormalities of gait and mobility: Secondary | ICD-10-CM | POA: Diagnosis not present

## 2019-01-24 DIAGNOSIS — M25675 Stiffness of left foot, not elsewhere classified: Secondary | ICD-10-CM | POA: Diagnosis not present

## 2019-01-24 DIAGNOSIS — R269 Unspecified abnormalities of gait and mobility: Secondary | ICD-10-CM | POA: Diagnosis not present

## 2019-01-24 DIAGNOSIS — M25572 Pain in left ankle and joints of left foot: Secondary | ICD-10-CM | POA: Diagnosis not present

## 2019-01-24 DIAGNOSIS — M25475 Effusion, left foot: Secondary | ICD-10-CM | POA: Diagnosis not present

## 2019-01-29 DIAGNOSIS — M25675 Stiffness of left foot, not elsewhere classified: Secondary | ICD-10-CM | POA: Diagnosis not present

## 2019-01-29 DIAGNOSIS — R269 Unspecified abnormalities of gait and mobility: Secondary | ICD-10-CM | POA: Diagnosis not present

## 2019-01-29 DIAGNOSIS — M25475 Effusion, left foot: Secondary | ICD-10-CM | POA: Diagnosis not present

## 2019-01-29 DIAGNOSIS — M25572 Pain in left ankle and joints of left foot: Secondary | ICD-10-CM | POA: Diagnosis not present

## 2019-01-31 DIAGNOSIS — M25475 Effusion, left foot: Secondary | ICD-10-CM | POA: Diagnosis not present

## 2019-01-31 DIAGNOSIS — M25675 Stiffness of left foot, not elsewhere classified: Secondary | ICD-10-CM | POA: Diagnosis not present

## 2019-01-31 DIAGNOSIS — R269 Unspecified abnormalities of gait and mobility: Secondary | ICD-10-CM | POA: Diagnosis not present

## 2019-01-31 DIAGNOSIS — M25572 Pain in left ankle and joints of left foot: Secondary | ICD-10-CM | POA: Diagnosis not present

## 2019-02-08 ENCOUNTER — Encounter: Payer: BLUE CROSS/BLUE SHIELD | Admitting: Internal Medicine

## 2019-02-14 NOTE — Progress Notes (Signed)
Subjective:    Patient ID: Maria Frank, female    DOB: 1975/06/29, 44 y.o.   MRN: [id number]  HPI She is here for a physical exam.   She denies major changes in her health since she was here last.  She was diagnosed with cluster headaches several years ago, which was typically a stabbing type pain in her left eye, change in vision, eye watering.  Her headaches are now in different places and some of them involve both eyes.  She knows stress may be partially the cause.  She is having headaches 4 out of the 7 days of the week.  She is interested in seeing a neurologist since that aches have changed her becoming much more frequent.  She has no other concerns.  She wants to focus on weight loss.  She did lose some weight last year.  Her foot pain has been hindering her activity and her foot pain is finally getting better and she is hoping that she will start to be more active, which will help.  Medications and allergies reviewed with patient and updated if appropriate.  Patient Active Problem List   Diagnosis Date Noted  . Cluster headaches 02/15/2019  . Eczema 02/15/2019  . 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 Foot pain, bilateral 02/06/2018  . IBS (irritable bowel syndrome) 02/06/2018  . Obesity (BMI 30.0-34.9) 02/06/2018  . Onychomycosis 03/04/2015  . Renal cyst 03/04/2015  . Osteopenia 03/04/2015  . Insomnia 03/04/2015  . IUD (intrauterine device) in place 12/01/2012  . Trivial mitral regurgitation 07/03/2010  . Hyperlipidemia 03/30/2010  . ALLERGIC RHINITIS 03/30/2010    Current Outpatient Medications on File Prior to Visit  Medication Sig Dispense Refill  . cetirizine (ZYRTEC) 10 MG tablet Take 1 tablet (10 mg total) by mouth daily. 30 tablet 11  . dicyclomine (BENTYL) 10 MG capsule TAKE 2 CAPSULES BY MOUTH 4 TIMES DAILY AS NEEDED FOR SPASMS. 90 capsule 1  . EPIPEN 2-PAK 0.3 MG/0.3ML SOAJ injection Inject 0.3 mLs (0.3 mg total) into the skin once as needed. 1 Device   .  mometasone (NASONEX) 50 MCG/ACT nasal spray 2 sprays by Nasal route daily.      . ondansetron (ZOFRAN) 4 MG tablet Take 4 mg by mouth every 6 (six) hours as needed.    Marland Kitchen PAZEO 0.7 % SOLN PLACE 1 DROP INTO BOTH EYES EVERY DAY  6  . Polyethylene Glycol 3350 (MIRALAX PO) Take by mouth.    . valACYclovir (VALTREX) 1000 MG tablet 2 tabs Q 12 hrs x 1 day for outbreak prn 20 tablet 5   No current facility-administered medications on file prior to visit.    Past Medical History:  Diagnosis Date  . ALLERGIC RHINITIS   . ARTHRITIS   . CHICKENPOX, HX OF   . DEPRESSION   . Heart murmur   . HYPERLIPIDEMIA   . IBS (irritable bowel syndrome)   . Migraine     Past Surgical History:  Procedure Laterality Date  . COLONOSCOPY  11/2016  . NO PAST SURGERIES      Social History   Socioeconomic History  . Marital status: Married    Spouse name: Not on file  . Number of children: Not on file  . Years of education: Not on file  . Highest education level: Not on file  Occupational History  . Not on file  Tobacco Use  . Smoking status: Never Smoker  . Smokeless tobacco: Never Used  Substance and Sexual Activity  .  Alcohol use: Yes    Alcohol/week: 0.0 standard drinks    Comment: Social  . Drug use: No  . Sexual activity: Yes    Partners: Male    Birth control/protection: None, I.U.D.  Other Topics Concern  . Not on file  Social History Narrative   Married since 2004, but separated 10/2012 from spouse. Originally from [place], in [place] since 2008. 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 Difficulty of Paying Living Expenses: Not on file  Food Insecurity:   . Worried About Charity fundraiser in the Last Year: Not on file  . Ran Out of Food in the Last Year: Not on file  Transportation Needs:   . Lack of Transportation (Medical): Not on file  . Lack of Transportation (Non-Medical): Not on file  Physical Activity:   . Days of  Exercise per Week: Not on file  . Minutes of Exercise per Session: Not on file  Stress:   . Feeling of Stress : Not on file  Social Connections:   . Frequency of Communication with Friends and Family: Not on file  . Frequency of Social Gatherings with Friends and Family: Not on file  . Attends Religious Services: Not on file  . Active Member of Clubs or Organizations: Not on file  . Attends Archivist Meetings: Not on file  . Marital Status: Not on file    Family History  Problem Relation Age of Onset  . Hypertension Father   . Cardiomyopathy Father   . Sleep apnea Father   . Cancer Mother        pancreatic, liver and renal   . Osteoporosis Mother   . Renal cancer Mother   . Hypertension Paternal Grandfather   . Arthritis Other        parent & grandparents  . Breast cancer Maternal Aunt 60  . Breast cancer Cousin 30  . Asthma Sister   . Sleep apnea Brother   . Asthma Sister   . Sleep apnea Brother   . Stomach cancer Neg Hx   . Rectal cancer Neg Hx   . Esophageal cancer Neg Hx   . Colon cancer Neg Hx     Review of Systems  Constitutional: Negative for chills and fever.  Eyes: Positive for visual disturbance (double vision - chronic).  Respiratory: Negative for cough, shortness of breath and wheezing.   Cardiovascular: Negative for chest pain, palpitations and leg swelling.  Gastrointestinal: Positive for nausea (related to headaches). Negative for abdominal pain, blood in stool, constipation and diarrhea.       Jerrye Bushy  - related to certain food - occ  Genitourinary: Negative for dysuria and hematuria.  Musculoskeletal: Negative for arthralgias and back pain.  Skin: Negative for color change and rash.  Neurological: Positive for dizziness (related to headaches) and headaches (frequent).  Psychiatric/Behavioral: Negative for dysphoric mood. The patient is not nervous/anxious.        Objective:   Vitals:   02/15/19 1004  BP: 126/74  Pulse: 93  Temp: 98.1  F (36.7 C)  SpO2: 98%   Filed Weights   02/15/19 1004  Weight: 186 lb 3.2 oz (84.5 kg)   Body mass index is 32.21 kg/m.  BP Readings from Last 3 Encounters:  02/15/19 126/74  12/20/18 128/84  02/16/18 127/85    Wt Readings from Last 3 Encounters:  02/15/19 186 lb 3.2 oz (84.5 kg)  12/20/18  186 lb (84.4 kg)  02/06/18 190 lb 6.4 oz (86.4 kg)     Physical Exam Constitutional: She appears well-developed and well-nourished. No distress.  HENT:  Head: Normocephalic and atraumatic.  Right Ear: External ear normal. Normal ear canal and TM Left Ear: External ear normal.  Normal ear canal and TM Mouth/Throat: Oropharynx is clear and moist.  Eyes: Conjunctivae and EOM are normal.  Neck: Neck supple. No tracheal deviation present. No thyromegaly present.  No carotid bruit  Cardiovascular: Normal rate, regular rhythm and normal heart sounds.  No murmur heard.  No edema. Pulmonary/Chest: Effort normal and breath sounds normal. No respiratory distress. She has no wheezes. She has no rales.  Breast: deferred   Abdominal: Soft. She exhibits no distension. There is no tenderness.  Lymphadenopathy: She has no cervical adenopathy.  Skin: Skin is warm and dry. She is not diaphoretic.  Psychiatric: She has a normal mood and affect. Her behavior is normal.        Assessment & Plan:   Physical exam: Screening blood work    ordered Immunizations  Up to date  Mammogram  Up to date  Gyn  Due - will schedule Exercise  Not regular - will start to be more active Weight  Wants to lose weight - feet pain finally getting better and wants to be more active Substance abuse  none  See Problem List for Assessment and Plan of chronic medical problems.   This visit occurred during the SARS-CoV-2 public health emergency.  Safety protocols were in place, including screening questions prior to the visit, additional usage of staff PPE, and extensive cleaning of exam room while observing appropriate  contact time as indicated for disinfecting solutions.        Follow-up in 1 year

## 2019-02-14 NOTE — Patient Instructions (Addendum)
Blood work was ordered.    All other Health Maintenance issues reviewed.   All recommended immunizations and age-appropriate screenings are up-to-date or discussed.  No immunization administered today.   Medications reviewed and updated.  Changes include :   none   [facility] was [facility]    Please followup in  1 year    Health Maintenance, Female Adopting a healthy lifestyle and getting preventive care are important in promoting health and wellness. Ask your health care provider about:  The right schedule for you to have regular tests and exams.  Things you can do on your own to prevent diseases and keep yourself healthy. What should I know about diet, weight, and exercise? Eat a healthy diet   Eat a diet that includes plenty of vegetables, fruits, low-fat dairy products, and lean protein.  Do not eat a lot of foods that are high in solid fats, added sugars, or sodium. Maintain a healthy weight Body mass index (BMI) is used to identify weight problems. It estimates body fat based on height and weight. Your health care provider can help determine your BMI and help you achieve or maintain a healthy weight. Get regular exercise Get regular exercise. This is one of the most important things you can do for your health. Most adults should:  Exercise for at least 150 minutes each week. The exercise should increase your heart rate and make you sweat (moderate-intensity exercise).  Do strengthening exercises at least twice a week. This is in addition to the moderate-intensity exercise.  Spend less time sitting. Even light physical activity can be beneficial. Watch cholesterol and blood lipids Have your blood tested for lipids and cholesterol at 45 years of age, then have this test every 5 years. Have your cholesterol levels checked more often if:  Your lipid or cholesterol levels are high.  You are older than 45 years of age.  You  are at high risk for heart disease. What should I know about cancer screening? Depending on your health history and family history, you may need to have cancer screening at various ages. This may include screening for:  Breast cancer.  Cervical cancer.  Colorectal cancer.  Skin cancer.  Lung cancer. What should I know about heart disease, diabetes, and high blood pressure? Blood pressure and heart disease  High blood pressure causes heart disease and increases the risk of stroke. This is more likely to develop in people who have high blood pressure readings, are of African descent, or are overweight.  Have your blood pressure checked: ? Every 3-5 years if you are 38-42 years of age. ? Every year if you are 46 years old or older. Diabetes Have regular diabetes screenings. This checks your fasting blood sugar level. Have the screening done:  Once every three years after age 32 if you are at a normal weight and have a low risk for diabetes.  More often and at a younger age if you are overweight or have a high risk for diabetes. What should I know about preventing infection? Hepatitis B If you have a higher risk for hepatitis B, you should be screened for this virus. Talk with your health care provider to find out if you are at risk for hepatitis B infection. Hepatitis C Testing is recommended for:  Everyone born from [birth year] through 1966.  Anyone with known risk factors for hepatitis C. Sexually transmitted infections (STIs)  Get screened for STIs, including gonorrhea and  chlamydia, if: ? You are sexually active and are younger than 45 years of age. ? You are older than 45 years of age and your health care provider tells you that you are at risk for this type of infection. ? Your sexual activity has changed since you were last screened, and you are at increased risk for chlamydia or gonorrhea. Ask your health care provider if you are at risk.  Ask your health care provider about  whether you are at high risk for HIV. Your health care provider may recommend a prescription medicine to help prevent HIV infection. If you choose to take medicine to prevent HIV, you should first get tested for HIV. You should then be tested every 3 months for as long as you are taking the medicine. Pregnancy  If you are about to stop having your period (premenopausal) and you may become pregnant, seek counseling before you get pregnant.  Take 400 to 800 micrograms (mcg) of folic acid every day if you become pregnant.  Ask for birth control (contraception) if you want to prevent pregnancy. Osteoporosis and menopause Osteoporosis is a disease in which the bones lose minerals and strength with aging. This can result in bone fractures. If you are 36 years old or older, or if you are at risk for osteoporosis and fractures, ask your health care provider if you should:  Be screened for bone loss.  Take a calcium or vitamin D supplement to lower your risk of fractures.  Be given hormone replacement therapy (HRT) to treat symptoms of menopause. Follow these instructions at home: Lifestyle  Do not use any products that contain nicotine or tobacco, such as cigarettes, e-cigarettes, and chewing tobacco. If you need help quitting, ask your health care provider.  Do not use street drugs.  Do not share needles.  Ask your health care provider for help if you need support or information about quitting drugs. Alcohol use  Do not drink alcohol if: ? Your health care provider tells you not to drink. ? You are pregnant, may be pregnant, or are planning to become pregnant.  If you drink alcohol: ? Limit how much you use to 0-1 drink a day. ? Limit intake if you are breastfeeding.  Be aware of how much alcohol is in your drink. In the [place] one drink equals one 12 oz bottle of beer (355 mL), one 5 oz glass of wine (148 mL), or one 1 oz glass of hard liquor (44 mL). General instructions  Schedule  regular health, dental, and eye exams.  Stay current with your vaccines.  Tell your health care provider if: ? You often feel depressed. ? You have ever been abused or do not feel safe at home. Summary  Adopting a healthy lifestyle and getting preventive care are important in promoting health and wellness.  Follow your health care provider's instructions about healthy diet, exercising, and getting tested or screened for diseases.  Follow your health care provider's instructions on monitoring your cholesterol and blood pressure. This information is not intended to replace advice given to you by your health care provider. Make sure you discuss any questions you have with your health care provider. Document Revised: 01/11/2018 Document Reviewed: 01/11/2018 Elsevier Patient Education  2020 [employer].

## 2019-02-15 ENCOUNTER — Encounter: Payer: Self-pay | Admitting: Internal Medicine

## 2019-02-15 ENCOUNTER — Ambulatory Visit (INDEPENDENT_AMBULATORY_CARE_PROVIDER_SITE_OTHER): Payer: 59 | Admitting: Internal Medicine

## 2019-02-15 ENCOUNTER — Other Ambulatory Visit: Payer: Self-pay

## 2019-02-15 VITALS — BP 126/74 | HR 93 | Temp 98.1°F | Ht 63.75 in | Wt 186.2 lb

## 2019-02-15 DIAGNOSIS — Z0000 Encounter for general adult medical examination without abnormal findings: Secondary | ICD-10-CM | POA: Diagnosis not present

## 2019-02-15 DIAGNOSIS — N281 Cyst of kidney, acquired: Secondary | ICD-10-CM | POA: Diagnosis not present

## 2019-02-15 DIAGNOSIS — G44009 Cluster headache syndrome, unspecified, not intractable: Secondary | ICD-10-CM | POA: Insufficient documentation

## 2019-02-15 DIAGNOSIS — G44019 Episodic cluster headache, not intractable: Secondary | ICD-10-CM | POA: Diagnosis not present

## 2019-02-15 DIAGNOSIS — L309 Dermatitis, unspecified: Secondary | ICD-10-CM | POA: Insufficient documentation

## 2019-02-15 DIAGNOSIS — B001 Herpesviral vesicular dermatitis: Secondary | ICD-10-CM

## 2019-02-15 DIAGNOSIS — E669 Obesity, unspecified: Secondary | ICD-10-CM

## 2019-02-15 LAB — COMPREHENSIVE METABOLIC PANEL
ALT: 16 U/L (ref 0–35)
AST: 18 U/L (ref 0–37)
Albumin: 4.3 g/dL (ref 3.5–5.2)
Alkaline Phosphatase: 57 U/L (ref 39–117)
BUN: 13 mg/dL (ref 6–23)
CO2: 24 mEq/L (ref 19–32)
Calcium: 9.5 mg/dL (ref 8.4–10.5)
Chloride: 104 mEq/L (ref 96–112)
Creatinine, Ser: 0.74 mg/dL (ref 0.40–1.20)
GFR: 84.94 mL/min (ref >60.00)
Glucose, Bld: 90 mg/dL (ref 70–99)
Potassium: 4.4 mEq/L (ref 3.5–5.1)
Sodium: 137 mEq/L (ref 135–145)
Total Bilirubin: 0.3 mg/dL (ref 0.2–1.2)
Total Protein: 7.5 g/dL (ref 6.0–8.3)

## 2019-02-15 LAB — CBC WITH DIFFERENTIAL/PLATELET
Basophils Absolute: 0 10*3/uL (ref 0.0–0.1)
Basophils Relative: 0.4 % (ref 0.0–3.0)
Eosinophils Absolute: 0 10*3/uL (ref 0.0–0.7)
Eosinophils Relative: 0.5 % (ref 0.0–5.0)
HCT: 38.7 % (ref 36.0–46.0)
Hemoglobin: 12.8 g/dL (ref 12.0–15.0)
Lymphocytes Relative: 37.4 % (ref 12.0–46.0)
Lymphs Abs: 2.8 10*3/uL (ref 0.7–4.0)
MCHC: 33.1 g/dL (ref 30.0–36.0)
MCV: 93.6 fl (ref 78.0–100.0)
Monocytes Absolute: 0.6 10*3/uL (ref 0.1–1.0)
Monocytes Relative: 8.2 % (ref 3.0–12.0)
Neutro Abs: 4 10*3/uL (ref 1.4–7.7)
Neutrophils Relative %: 53.5 % (ref 43.0–77.0)
Platelets: 261 10*3/uL (ref 150.0–400.0)
RBC: 4.13 Mil/uL (ref 3.87–5.11)
RDW: 13.4 % (ref 11.5–15.5)
WBC: 7.4 10*3/uL (ref 4.0–10.5)

## 2019-02-15 LAB — LIPID PANEL
Cholesterol: 234 mg/dL — ABNORMAL HIGH (ref 0–200)
HDL: 75.1 mg/dL (ref >39.00)
LDL Cholesterol: 142 mg/dL — ABNORMAL HIGH (ref 0–99)
NonHDL: 159.16
Total CHOL/HDL Ratio: 3
Triglycerides: 86 mg/dL (ref 0.0–149.0)
VLDL: 17.2 mg/dL (ref 0.0–40.0)

## 2019-02-15 LAB — TSH: TSH: 1.8 u[IU]/mL (ref 0.35–4.50)

## 2019-02-15 NOTE — Assessment & Plan Note (Signed)
Valacyclovir as needed

## 2019-02-15 NOTE — Assessment & Plan Note (Signed)
Chronic Working on weight loss She feels she eats reasonably well and more of her issue at this time is not been able to be active Her foot pain is finally improving and she was try to increase her activity

## 2019-02-15 NOTE — Assessment & Plan Note (Signed)
Pain in eye, blurry vision - takes cyclobenzaprine 2.5 mg prn Headaches have changed and has gotten worse - different location, 4/7 days having headaches. Not taking anything  Interested in seeing [facility]. Referred today

## 2019-02-15 NOTE — Assessment & Plan Note (Signed)
Following with urology

## 2019-03-30 ENCOUNTER — Telehealth: Payer: Self-pay | Admitting: Internal Medicine

## 2019-03-30 NOTE — Telephone Encounter (Signed)
Rec'd from [place] forwarded 6 pages to Dr.[facility]

## 2019-05-25 ENCOUNTER — Other Ambulatory Visit: Payer: Self-pay

## 2019-05-28 ENCOUNTER — Encounter: Payer: Self-pay | Admitting: Obstetrics & Gynecology

## 2019-05-28 ENCOUNTER — Other Ambulatory Visit: Payer: Self-pay

## 2019-05-28 ENCOUNTER — Ambulatory Visit (INDEPENDENT_AMBULATORY_CARE_PROVIDER_SITE_OTHER): Payer: 59 | Admitting: Obstetrics & Gynecology

## 2019-05-28 VITALS — BP 128/84 | Ht 63.0 in | Wt 180.0 lb

## 2019-05-28 DIAGNOSIS — Z01419 Encounter for gynecological examination (general) (routine) without abnormal findings: Secondary | ICD-10-CM | POA: Diagnosis not present

## 2019-05-28 DIAGNOSIS — Z975 Presence of (intrauterine) contraceptive device: Secondary | ICD-10-CM | POA: Diagnosis not present

## 2019-05-28 DIAGNOSIS — N945 Secondary dysmenorrhea: Secondary | ICD-10-CM | POA: Diagnosis not present

## 2019-05-28 DIAGNOSIS — N920 Excessive and frequent menstruation with regular cycle: Secondary | ICD-10-CM

## 2019-05-28 NOTE — Progress Notes (Signed)
Maria Frank Oct 20, 1975 [id number]   History:    44 y.o. G0  Married  RP:  Established patient presenting for annual gyn exam  HPI:  Well on Paragard IUD x 11/2012. Pelvic US 12/2016 IUD in good IU location.  No pelvic pain.  Menses more frequent every 21-27 days, increased flow and dysmenorrhea.  Not sexually active x 9 yrs, but her and husband are now good with that.   Breasts wnl.  BMI 31.89.     Past medical history,surgical history, family history and social history were all reviewed and documented in the EPIC chart.  Gynecologic History Patient's last menstrual period was 05/22/2019.  Obstetric History OB History  Gravida Para Term Preterm AB Living  0 0 0 0 0 0  SAB TAB Ectopic Multiple Live Births  0 0 0 0       ROS: A ROS was performed and pertinent positives and negatives are included in the history.  GENERAL: No fevers or chills. HEENT: No change in vision, no earache, sore throat or sinus congestion. NECK: No pain or stiffness. CARDIOVASCULAR: No chest pain or pressure. No palpitations. PULMONARY: No shortness of breath, cough or wheeze. GASTROINTESTINAL: No abdominal pain, nausea, vomiting or diarrhea, melena or bright red blood per rectum. GENITOURINARY: No urinary frequency, urgency, hesitancy or dysuria. MUSCULOSKELETAL: No joint or muscle pain, no back pain, no recent trauma. DERMATOLOGIC: No rash, no itching, no lesions. ENDOCRINE: No polyuria, polydipsia, no heat or cold intolerance. No recent change in weight. HEMATOLOGICAL: No anemia or easy bruising or bleeding. NEUROLOGIC: No headache, seizures, numbness, tingling or weakness. PSYCHIATRIC: No depression, no loss of interest in normal activity or change in sleep pattern.     Exam:   BP 128/84   Ht 5\' 3"  (1.6 m)   Wt 180 lb (81.6 kg)   LMP 05/22/2019 Comment: PARAGARD  BMI 31.89 kg/m   Body mass index is 31.89 kg/m.  General appearance : Well developed well nourished female. No acute  distress HEENT: Eyes: no retinal hemorrhage or exudates,  Neck supple, trachea midline, no carotid bruits, no thyroidmegaly Lungs: Clear to auscultation, no rhonchi or wheezes, or rib retractions  Heart: Regular rate and rhythm, no murmurs or gallops Breast:Examined in sitting and supine position were symmetrical in appearance, no palpable masses or tenderness,  no skin retraction, no nipple inversion, no nipple discharge, no skin discoloration, no axillary or supraclavicular lymphadenopathy Abdomen: no palpable masses or tenderness, no rebound or guarding Extremities: no edema or skin discoloration or tenderness  Pelvic: Vulva: Normal             Vagina: No gross lesions or discharge  Cervix: No gross lesions or discharge.  IUD strings visible at [facility].  Pap reflex done.  Uterus  AV, normal size, shape and consistency, non-tender and mobile  Adnexa  Without masses or tenderness  Anus: Normal   Assessment/Plan:  44 y.o. female for annual exam   1. Encounter for routine gynecological examination with Papanicolaou smear of cervix Normal gynecologic exam.  Pap reflex done.  Breast exam normal.  Screening mammogram October 2020 was negative.  Body mass index 31.89.  Recommend a lower calorie/carb diet.  Increase fitness with aerobic activities 5 times a week and light weightlifting every 2 days.  2. IUD contraception ParaGard IUD x 11/2012, in good location.  Abstinent for many years.  3. Polymenorrhea Frequent menses with slightly increased flow every 21 days.  Will investigate further with a pelvic ultrasound at  follow-up. - US Transvaginal Non-OB; Future  4. Secondary dysmenorrhea - US Transvaginal Non-OB; Future  Other orders - zonisamide (ZONEGRAN) 100 MG capsule; Take 100 mg by mouth daily. - metoCLOPramide (REGLAN) 10 MG tablet; Take 10 mg by mouth 4 (four) times daily.  [name] MD, 10:21 AM 05/28/2019

## 2019-05-28 NOTE — Addendum Note (Signed)
Addended by: Thurnell Garbe A on: 05/28/2019 02:43 PM   Modules accepted: Orders

## 2019-05-28 NOTE — Patient Instructions (Signed)
1. Encounter for routine gynecological examination with Papanicolaou smear of cervix Normal gynecologic exam.  Pap reflex done.  Breast exam normal.  Screening mammogram October 2020 was negative.  Body mass index 31.89.  Recommend a lower calorie/carb diet.  Increase fitness with aerobic activities 5 times a week and light weightlifting every 2 days.  2. IUD contraception ParaGard IUD x 11/2012, in good location.  Abstinent for many years.  3. Polymenorrhea Frequent menses with slightly increased flow every 21 days.  Will investigate further with a pelvic ultrasound at follow-up. - US Transvaginal Non-OB; Future  4. Secondary dysmenorrhea - US Transvaginal Non-OB; Future  Other orders - zonisamide (ZONEGRAN) 100 MG capsule; Take 100 mg by mouth daily. - metoCLOPramide (REGLAN) 10 MG tablet; Take 10 mg by mouth 4 (four) times daily.  Maria Frank, it was a pleasure seeing you today!  I will inform you of your results as soon as they are available.

## 2019-05-29 LAB — PAP IG W/ RFLX HPV ASCU

## 2019-06-04 ENCOUNTER — Encounter: Payer: Self-pay | Admitting: *Deleted

## 2019-06-13 ENCOUNTER — Other Ambulatory Visit: Payer: Self-pay

## 2019-06-14 ENCOUNTER — Encounter: Payer: Self-pay | Admitting: Obstetrics & Gynecology

## 2019-06-14 ENCOUNTER — Ambulatory Visit (INDEPENDENT_AMBULATORY_CARE_PROVIDER_SITE_OTHER): Payer: 59 | Admitting: Obstetrics & Gynecology

## 2019-06-14 ENCOUNTER — Ambulatory Visit (INDEPENDENT_AMBULATORY_CARE_PROVIDER_SITE_OTHER): Payer: 59

## 2019-06-14 DIAGNOSIS — D252 Subserosal leiomyoma of uterus: Secondary | ICD-10-CM

## 2019-06-14 DIAGNOSIS — Z30432 Encounter for removal of intrauterine contraceptive device: Secondary | ICD-10-CM | POA: Diagnosis not present

## 2019-06-14 DIAGNOSIS — N945 Secondary dysmenorrhea: Secondary | ICD-10-CM

## 2019-06-14 DIAGNOSIS — N854 Malposition of uterus: Secondary | ICD-10-CM | POA: Diagnosis not present

## 2019-06-14 DIAGNOSIS — N920 Excessive and frequent menstruation with regular cycle: Secondary | ICD-10-CM | POA: Diagnosis not present

## 2019-06-14 DIAGNOSIS — T8332XA Displacement of intrauterine contraceptive device, initial encounter: Secondary | ICD-10-CM

## 2019-06-14 NOTE — Progress Notes (Signed)
    Maria Frank 10/22/1975 [id number]        44 y.o.  G0 Married  RP: Polymenorrhea/Dysmenorrhea/Paragard IUD for Pelvic US  HPI: No change x last visit 05/28/2019 when we noted: Well on Paragard IUD x 11/2012. Pelvic US 12/2016 IUD in good IU location. No pelvic pain. Menses more frequent every 21-27 days, increased flow and dysmenorrhea. Not sexually active x 9 yrs, but her and husband are now good with that.   OB History  Gravida Para Term Preterm AB Living  0 0 0 0 0 0  SAB TAB Ectopic Multiple Live Births  0 0 0 0      Past medical history,surgical history, problem list, medications, allergies, family history and social history were all reviewed and documented in the EPIC chart.   Directed ROS with pertinent positives and negatives documented in the history of present illness/assessment and plan.  Exam:  There were no vitals filed for this visit. General appearance:  Normal  Pelvic US today: Nonlatex probe cover used.  T/V images.  Anteverted uterus normal in size and shape with several intramural and subserosal fibroids, one adjacent to the endometrial cavity.  The uterus is measured at 8.84 x 5.06 x 3.87 cm.  4 fibroids are measured all below 2 cm.  The endometrial lining is symmetrical with no mass or thickening seen.  The endometrial lining is measured at 7.81 mm.  The IUD is displaced into the lower uterine segment with the arms protruding into the myometrium.  Both ovaries are normal in size with normal follicular pattern.  A 1.8 cm resolving corpus luteum cyst is seen on the left ovary.  Patient is cycle day #21.  No adnexal mass.  No free fluid in the posterior cul-de-sac.  Gynecologic exam for removal of ParaGard IUD:  Vulva normal.  Speculum:  Cervix normal with IUD strings visible at the External Os.  Vagina normal.  Normal secretions.  Cervix sprayed with Hurricaine.  Betadine applied on the cervix.  Small IUD removal clamp used.  Lower aspect of the IUD device  grasped with a clamp under ultrasound guidance.  IUD is incarcerated, not moving freely.  Decision to stop at this point and proceed under hysteroscopy for removal.   Assessment/Plan:  44 y.o. G0  1. Polymenorrhea Polymenorrhea and dysmenorrhea associated with ParaGard IUD.  Pelvic ultrasound thoroughly reviewed with patient.  The ParaGard IUD is displaced in the lower uterine segment with the arms protruding into the myometrium.  Confirmed incarceration of the IUD with unsuccessful attempt at removal under ultrasound guidance.  We will proceed under hysteroscopy.  2. Secondary dysmenorrhea As above.  3. IUD migration, initial encounter Attempted removal, but given resistance, decision to remove under Hysteroscopy.  Schedule Cervical Dilation/HSC Shirlean Mylar of migrated Paragard IUD.  Follow-up preop televisit through WebEx.  [name] MD, 4:27 PM 06/14/2019

## 2019-06-16 ENCOUNTER — Encounter: Payer: Self-pay | Admitting: Obstetrics & Gynecology

## 2019-06-16 NOTE — Patient Instructions (Signed)
1. Polymenorrhea Polymenorrhea and dysmenorrhea associated with ParaGard IUD.  Pelvic ultrasound thoroughly reviewed with patient.  The ParaGard IUD is displaced in the lower uterine segment with the arms protruding into the myometrium.  Confirmed incarceration of the IUD with unsuccessful attempt at removal under ultrasound guidance.  We will proceed under hysteroscopy.  2. Secondary dysmenorrhea As above.  3. IUD migration, initial encounter Attempted removal, but given resistance, decision to remove under Hysteroscopy.  Schedule Cervical Dilation/HSC Shirlean Mylar of migrated Paragard IUD.  Follow-up preop televisit through WebEx.  Elliahna, it was a pleasure seeing you today!

## 2019-06-19 ENCOUNTER — Telehealth: Payer: Self-pay

## 2019-06-19 NOTE — Telephone Encounter (Signed)
I spoke with patient to schedule surgery. Surgery scheduled 07/13/19 at 9:45am at [facility]  I spoke with patient about her ins benefits and her estimated surgery prepayment amount due to GGA by one week prior to surgery. Financial letter will be sent in her packet.  I discussed with her the need for a Covid test prior to surgery and the associated quarantine protocol after the test.  Appt scheduled.  Pre op visit was scheduled 07/03/19 at 2:40pm.  Packet will be mailed.

## 2019-06-20 ENCOUNTER — Encounter: Payer: Self-pay | Admitting: Anesthesiology

## 2019-06-29 ENCOUNTER — Other Ambulatory Visit: Payer: Self-pay

## 2019-07-03 ENCOUNTER — Encounter: Payer: Self-pay | Admitting: Obstetrics & Gynecology

## 2019-07-03 ENCOUNTER — Other Ambulatory Visit: Payer: Self-pay

## 2019-07-03 ENCOUNTER — Ambulatory Visit (INDEPENDENT_AMBULATORY_CARE_PROVIDER_SITE_OTHER): Payer: No Typology Code available for payment source | Admitting: Obstetrics & Gynecology

## 2019-07-03 VITALS — BP 138/84

## 2019-07-03 DIAGNOSIS — T8332XA Displacement of intrauterine contraceptive device, initial encounter: Secondary | ICD-10-CM

## 2019-07-03 DIAGNOSIS — T8332XD Displacement of intrauterine contraceptive device, subsequent encounter: Secondary | ICD-10-CM

## 2019-07-06 ENCOUNTER — Encounter (HOSPITAL_BASED_OUTPATIENT_CLINIC_OR_DEPARTMENT_OTHER): Payer: Self-pay | Admitting: Obstetrics & Gynecology

## 2019-07-06 ENCOUNTER — Other Ambulatory Visit: Payer: Self-pay

## 2019-07-06 NOTE — Progress Notes (Signed)
[employer] COVID vaccine completed 04/2019

## 2019-07-06 NOTE — Progress Notes (Signed)
Spoke with: Estill Bamberg NPO:  After Midnight, no gum, candy, or mints   Arrival time: 0800 AM Lab needs dos----  UPT, Hgb            Lab results------N/A COVID test ------07/10/2019 Medications to take morning of surgery: None Pre op orders in epic No LMOM for [facility] requested on 07/06/2019 Diabetic medication -----N/A Patient Special Instructions -----N/A Pre-Op special Istructions -----N/A Ride home:Mike (husband) [phone]  Patient verbalized understanding of instructions that were given at this phone interview. Patient denies shortness of breath, chest pain, fever, cough a this phone interview.

## 2019-07-09 ENCOUNTER — Encounter: Payer: Self-pay | Admitting: Obstetrics & Gynecology

## 2019-07-09 NOTE — Patient Instructions (Signed)
1. Intrauterine device (IUD) migration, subsequent encounter Attempted removal, but given resistance, decision to remove under Hysteroscopy.  Scheduled for Cervical Dilation/HSC Shirlean Mylar of migrated Paragard IUD.  Preop preparation, surgical procedure with risks/benefits and post op expectations and precautions reviewed.                           Patient was counseled as to the risk of surgery to include the following:  1. Infection (prohylactic antibiotics will be administered)  2. DVT/Pulmonary Embolism (prophylactic pneumo compression stockings will be used)  3.Trauma to internal organs requiring additional surgical procedure to repair any injury to internal organs requiring perhaps additional hospitalization days.  4.Hemmorhage requiring transfusion and blood products which carry risks such as anaphylactic reaction, hepatitis and AIDS.  Patient had received literature information on the procedure scheduled and all her questions were answered and fully accepts all risk.   Maria Frank, it was a pleasure seeing you today!

## 2019-07-09 NOTE — Progress Notes (Signed)
    Maria Frank 09/28/1975 [id number]        44 y.o.   G0 Married.   RP: Polymenorrhea/Dysmenorrhea/Paragard IUD for Pelvic US  HPI: No change x last visit 06/14/2019 when we noted: Well on Paragard IUD x 11/2012.No pelvic pain. Mensesmore frequent every 21-27 days, increased flow and dysmenorrhea. Not sexually active x60yrs, but her and husband are now good with that.   OB History  Gravida Para Term Preterm AB Living  0 0 0 0 0 0  SAB TAB Ectopic Multiple Live Births  0 0 0 0      Past medical history,surgical history, problem list, medications, allergies, family history and social history were all reviewed and documented in the EPIC chart.   Directed ROS with pertinent positives and negatives documented in the history of present illness/assessment and plan.  Exam:  Vitals:   07/03/19 1617  BP: 138/84   General appearance:  Normal  Pelvic US 06/14/2019: Nonlatex probe cover used. T/V images. Anteverted uterus normal in size and shape with several intramural and subserosal fibroids, one adjacent to the endometrial cavity. The uterus is measured at 8.84 x 5.06 x 3.87 cm. 4 fibroids are measured all below 2 cm. The endometrial lining is symmetrical with no mass or thickening seen. The endometrial lining is measured at 7.81 mm. The IUD is displaced into the lower uterine segment with the arms protruding into the myometrium. Both ovaries are normal in size with normal follicular pattern. A 1.8 cm resolving corpus luteum cyst is seen on the left ovary. Patient is cycle day #21. No adnexal mass. No free fluid in the posterior cul-de-sac.   Assessment/Plan:  44 y.o. G0P0000   1. Intrauterine device (IUD) migration, subsequent encounter Attempted removal, but given resistance, decision to remove under Hysteroscopy.  Scheduled for Cervical Dilation/HSC Maria Frank of migrated Paragard IUD.  Preop preparation, surgical procedure with risks/benefits and post op expectations  and precautions reviewed.                           Patient was counseled as to the risk of surgery to include the following:  1. Infection (prohylactic antibiotics will be administered)  2. DVT/Pulmonary Embolism (prophylactic pneumo compression stockings will be used)  3.Trauma to internal organs requiring additional surgical procedure to repair any injury to internal organs requiring perhaps additional hospitalization days.  4.Hemmorhage requiring transfusion and blood products which carry risks such as anaphylactic reaction, hepatitis and AIDS.  Patient had received literature information on the procedure scheduled and all her questions were answered and fully accepts all risk.    Maria Bruins MD, 5:59 AM 07/09/2019

## 2019-07-10 ENCOUNTER — Other Ambulatory Visit (HOSPITAL_COMMUNITY)
Admission: RE | Admit: 2019-07-10 | Discharge: 2019-07-10 | Disposition: A | Discharge status: Home / Self Care | Payer: No Typology Code available for payment source | Source: Ambulatory Visit | Attending: Obstetrics & Gynecology | Admitting: Obstetrics & Gynecology

## 2019-07-10 DIAGNOSIS — Z20822 Contact with and (suspected) exposure to covid-19: Secondary | ICD-10-CM | POA: Diagnosis not present

## 2019-07-10 DIAGNOSIS — Z01812 Encounter for preprocedural laboratory examination: Secondary | ICD-10-CM | POA: Diagnosis present

## 2019-07-10 LAB — SARS CORONAVIRUS 2 (TAT 6-24 HRS): SARS Coronavirus 2: NEGATIVE

## 2019-07-13 ENCOUNTER — Other Ambulatory Visit: Payer: Self-pay

## 2019-07-13 ENCOUNTER — Ambulatory Visit (HOSPITAL_BASED_OUTPATIENT_CLINIC_OR_DEPARTMENT_OTHER)
Admission: RE | Admit: 2019-07-13 | Discharge: 2019-07-13 | Disposition: A | Discharge status: Home / Self Care | Payer: No Typology Code available for payment source | Attending: Obstetrics & Gynecology | Admitting: Obstetrics & Gynecology

## 2019-07-13 ENCOUNTER — Encounter (HOSPITAL_BASED_OUTPATIENT_CLINIC_OR_DEPARTMENT_OTHER): Payer: Self-pay | Admitting: Obstetrics & Gynecology

## 2019-07-13 ENCOUNTER — Encounter (HOSPITAL_BASED_OUTPATIENT_CLINIC_OR_DEPARTMENT_OTHER)
Admission: RE | Disposition: A | Discharge status: Home / Self Care | Payer: Self-pay | Source: Home / Self Care | Attending: Obstetrics & Gynecology

## 2019-07-13 ENCOUNTER — Ambulatory Visit (HOSPITAL_BASED_OUTPATIENT_CLINIC_OR_DEPARTMENT_OTHER): Payer: No Typology Code available for payment source | Admitting: Certified Registered"

## 2019-07-13 DIAGNOSIS — K589 Irritable bowel syndrome without diarrhea: Secondary | ICD-10-CM | POA: Diagnosis not present

## 2019-07-13 DIAGNOSIS — T8332XA Displacement of intrauterine contraceptive device, initial encounter: Secondary | ICD-10-CM | POA: Insufficient documentation

## 2019-07-13 DIAGNOSIS — F329 Major depressive disorder, single episode, unspecified: Secondary | ICD-10-CM | POA: Insufficient documentation

## 2019-07-13 DIAGNOSIS — Z6832 Body mass index (BMI) 32.0-32.9, adult: Secondary | ICD-10-CM | POA: Diagnosis not present

## 2019-07-13 DIAGNOSIS — G43909 Migraine, unspecified, not intractable, without status migrainosus: Secondary | ICD-10-CM | POA: Insufficient documentation

## 2019-07-13 DIAGNOSIS — Z30432 Encounter for removal of intrauterine contraceptive device: Secondary | ICD-10-CM | POA: Diagnosis not present

## 2019-07-13 DIAGNOSIS — E785 Hyperlipidemia, unspecified: Secondary | ICD-10-CM | POA: Diagnosis not present

## 2019-07-13 DIAGNOSIS — Y762 Prosthetic and other implants, materials and accessory obstetric and gynecological devices associated with adverse incidents: Secondary | ICD-10-CM | POA: Diagnosis not present

## 2019-07-13 DIAGNOSIS — E669 Obesity, unspecified: Secondary | ICD-10-CM | POA: Diagnosis not present

## 2019-07-13 DIAGNOSIS — Z79899 Other long term (current) drug therapy: Secondary | ICD-10-CM | POA: Diagnosis not present

## 2019-07-13 DIAGNOSIS — T8339XA Other mechanical complication of intrauterine contraceptive device, initial encounter: Secondary | ICD-10-CM | POA: Diagnosis not present

## 2019-07-13 HISTORY — PX: HYSTEROSCOPY: SHX211

## 2019-07-13 HISTORY — DX: Iron deficiency anemia, unspecified: D50.9

## 2019-07-13 HISTORY — DX: Obesity, unspecified: E66.9

## 2019-07-13 HISTORY — DX: Cyst of kidney, acquired: N28.1

## 2019-07-13 HISTORY — DX: Herpesviral vesicular dermatitis: B00.1

## 2019-07-13 HISTORY — DX: Presence of spectacles and contact lenses: Z97.3

## 2019-07-13 LAB — CBC
HCT: 36.8 % (ref 36.0–46.0)
Hemoglobin: 12.5 g/dL (ref 12.0–15.0)
MCH: 31.8 pg (ref 26.0–34.0)
MCHC: 34 g/dL (ref 30.0–36.0)
MCV: 93.6 fL (ref 80.0–100.0)
Platelets: 211 10*3/uL (ref 150–400)
RBC: 3.93 MIL/uL (ref 3.87–5.11)
RDW: 12.9 % (ref 11.5–15.5)
WBC: 8.2 10*3/uL (ref 4.0–10.5)
nRBC: 0 % (ref 0.0–0.2)

## 2019-07-13 LAB — POCT PREGNANCY, URINE: Preg Test, Ur: NEGATIVE

## 2019-07-13 SURGERY — HYSTEROSCOPY
Anesthesia: General

## 2019-07-13 MED ORDER — ONDANSETRON HCL 4 MG/2ML IJ SOLN: 4.0000 mg | Freq: Once | INTRAMUSCULAR | Status: DC | PRN

## 2019-07-13 MED ORDER — FENTANYL CITRATE (PF) 100 MCG/2ML IJ SOLN: 25.0000 ug | INTRAMUSCULAR | Status: DC | PRN

## 2019-07-13 MED ORDER — LACTATED RINGERS IV SOLN
INTRAVENOUS | Status: DC
  Administered 2019-07-13: 50 mL via INTRAVENOUS

## 2019-07-13 MED ORDER — ONDANSETRON HCL 4 MG/2ML IJ SOLN
INTRAMUSCULAR | Status: AC
  Filled 2019-07-13: qty 2

## 2019-07-13 MED ORDER — DEXAMETHASONE SODIUM PHOSPHATE 10 MG/ML IJ SOLN
INTRAMUSCULAR | Status: AC
  Filled 2019-07-13: qty 1

## 2019-07-13 MED ORDER — MIDAZOLAM HCL 2 MG/2ML IJ SOLN
INTRAMUSCULAR | Status: AC
  Filled 2019-07-13: qty 2

## 2019-07-13 MED ORDER — DEXAMETHASONE SODIUM PHOSPHATE 10 MG/ML IJ SOLN
INTRAMUSCULAR | Status: DC | PRN
Start: 2019-07-13 — End: 2019-07-13
  Administered 2019-07-13: 4 mg via INTRAVENOUS

## 2019-07-13 MED ORDER — ACETAMINOPHEN 160 MG/5ML PO SOLN: 325.0000 mg | ORAL | Status: DC | PRN

## 2019-07-13 MED ORDER — CEFAZOLIN SODIUM-DEXTROSE 2-4 GM/100ML-% IV SOLN
2.0000 g | INTRAVENOUS | Status: AC
  Administered 2019-07-13: 2 g via INTRAVENOUS

## 2019-07-13 MED ORDER — LACTATED RINGERS IV SOLN: INTRAVENOUS | Status: DC

## 2019-07-13 MED ORDER — MEPERIDINE HCL 25 MG/ML IJ SOLN: 6.2500 mg | INTRAMUSCULAR | Status: DC | PRN

## 2019-07-13 MED ORDER — FENTANYL CITRATE (PF) 100 MCG/2ML IJ SOLN
INTRAMUSCULAR | Status: AC
  Filled 2019-07-13: qty 2

## 2019-07-13 MED ORDER — KETOROLAC TROMETHAMINE 30 MG/ML IJ SOLN: 30.0000 mg | Freq: Once | INTRAMUSCULAR | Status: DC | PRN

## 2019-07-13 MED ORDER — ONDANSETRON HCL 4 MG/2ML IJ SOLN
INTRAMUSCULAR | Status: DC | PRN
  Administered 2019-07-13: 4 mg via INTRAVENOUS

## 2019-07-13 MED ORDER — SODIUM CHLORIDE 0.9 % IR SOLN
Status: DC | PRN
  Administered 2019-07-13: 3000 mL

## 2019-07-13 MED ORDER — MIDAZOLAM HCL 5 MG/5ML IJ SOLN
INTRAMUSCULAR | Status: DC | PRN
  Administered 2019-07-13: 2 mg via INTRAVENOUS

## 2019-07-13 MED ORDER — PROPOFOL 10 MG/ML IV BOLUS
INTRAVENOUS | Status: DC | PRN
  Administered 2019-07-13: 150 mg via INTRAVENOUS

## 2019-07-13 MED ORDER — ACETAMINOPHEN 325 MG PO TABS: 325.0000 mg | ORAL_TABLET | ORAL | Status: DC | PRN

## 2019-07-13 MED ORDER — LIDOCAINE 2% (20 MG/ML) 5 ML SYRINGE
INTRAMUSCULAR | Status: DC | PRN
  Administered 2019-07-13: 100 mg via INTRAVENOUS

## 2019-07-13 MED ORDER — CHLOROPROCAINE HCL 1 % IJ SOLN
INTRAMUSCULAR | Status: DC | PRN
  Administered 2019-07-13: 20 mL

## 2019-07-13 MED ORDER — FENTANYL CITRATE (PF) 250 MCG/5ML IJ SOLN
INTRAMUSCULAR | Status: DC | PRN
  Administered 2019-07-13 (×2): 50 ug via INTRAVENOUS

## 2019-07-13 MED ORDER — CEFAZOLIN SODIUM-DEXTROSE 2-4 GM/100ML-% IV SOLN
INTRAVENOUS | Status: AC
  Filled 2019-07-13: qty 100

## 2019-07-13 MED ORDER — HYDROCODONE-ACETAMINOPHEN 7.5-325 MG PO TABS: 1.0000 | ORAL_TABLET | Freq: Once | ORAL | Status: DC | PRN

## 2019-07-13 SURGICAL SUPPLY — 18 items
BIPOLAR CUTTING LOOP 21FR (ELECTRODE)
CATH SILICONE 16FRX5CC (CATHETERS) ×3 IMPLANT
COVER WAND RF STERILE (DRAPES) ×3 IMPLANT
GAUZE 4X4 16PLY RFD (DISPOSABLE) ×3 IMPLANT
GLOVE BIOGEL PI IND STRL 6.5 (GLOVE) ×1 IMPLANT
GLOVE BIOGEL PI IND STRL 7.0 (GLOVE) ×2 IMPLANT
GLOVE BIOGEL PI INDICATOR 6.5 (GLOVE) ×2
GLOVE BIOGEL PI INDICATOR 7.0 (GLOVE) ×4
GLOVE SURG SS PI 6.5 STRL IVOR (GLOVE) ×6 IMPLANT
GOWN STRL REUS W/TWL LRG LVL3 (GOWN DISPOSABLE) ×6 IMPLANT
IV NS IRRIG 3000ML ARTHROMATIC (IV SOLUTION) ×3 IMPLANT
KIT PROCEDURE FLUENT (KITS) ×3 IMPLANT
KIT TURNOVER CYSTO (KITS) ×3 IMPLANT
LOOP CUTTING BIPOLAR 21FR (ELECTRODE) IMPLANT
PACK VAGINAL MINOR WOMEN LF (CUSTOM PROCEDURE TRAY) ×3 IMPLANT
PAD OB MATERNITY 4.3X12.25 (PERSONAL CARE ITEMS) ×3 IMPLANT
TOWEL OR 17X26 10 PK STRL BLUE (TOWEL DISPOSABLE) ×6 IMPLANT
WATER STERILE IRR 500ML POUR (IV SOLUTION) IMPLANT

## 2019-07-13 NOTE — Anesthesia Procedure Notes (Signed)
Procedure Name: LMA Insertion Date/Time: 07/13/2019 9:36 AM Performed by: Myna Bright, CRNA Pre-anesthesia Checklist: Patient identified, Emergency Drugs available, Suction available and Patient being monitored Patient Re-evaluated:Patient Re-evaluated prior to induction Oxygen Delivery Method: Circle system utilized Preoxygenation: Pre-oxygenation with 100% oxygen Induction Type: IV induction Ventilation: Mask ventilation without difficulty LMA: LMA inserted LMA Size: 4.0 Tube type: Oral Number of attempts: 1 Placement Confirmation: positive ETCO2 and breath sounds checked- equal and bilateral Tube secured with: Tape Dental Injury: Teeth and Oropharynx as per pre-operative assessment

## 2019-07-13 NOTE — Anesthesia Preprocedure Evaluation (Signed)
Anesthesia Evaluation  Patient identified by MRN, date of birth, ID band Patient awake    Reviewed: Allergy & Precautions, NPO status , Patient's Chart, lab work & pertinent test results  Airway Mallampati: I       Dental no notable dental hx. (+) Teeth Intact   Pulmonary neg pulmonary ROS,    Pulmonary exam normal breath sounds clear to auscultation       Cardiovascular negative cardio ROS Normal cardiovascular exam Rhythm:Regular Rate:Normal     Neuro/Psych    GI/Hepatic negative GI ROS, Neg liver ROS,   Endo/Other  negative endocrine ROS  Renal/GU      Musculoskeletal negative musculoskeletal ROS (+)   Abdominal (+) + obese,   Peds  Hematology negative hematology ROS (+)   Anesthesia Other Findings   Reproductive/Obstetrics                             Anesthesia Physical Anesthesia Plan  ASA: II  Anesthesia Plan: General   Post-op Pain Management:    Induction: Intravenous  PONV Risk Score and Plan: 4 or greater and Ondansetron, Dexamethasone and Midazolam  Airway Management Planned: LMA  Additional Equipment: None  Intra-op Plan:   Post-operative Plan: Extubation in OR  Informed Consent: I have reviewed the patients History and Physical, chart, labs and discussed the procedure including the risks, benefits and alternatives for the proposed anesthesia with the patient or authorized representative who has indicated his/her understanding and acceptance.     Dental advisory given  Plan Discussed with: CRNA  Anesthesia Plan Comments:         Anesthesia Quick Evaluation

## 2019-07-13 NOTE — Transfer of Care (Signed)
Immediate Anesthesia Transfer of Care Note  Patient: Maria Frank  Procedure(s) Performed: DILATION OF CERVIX, HYSTEROSCOPY, REMOVAL OF INCARCERATED PARAGARD INTRAUTERINE DEVICE (N/A )  Patient Location: PACU  Anesthesia Type:General  Level of Consciousness: awake, alert , oriented and patient cooperative  Airway & Oxygen Therapy: Patient Spontanous Breathing and Patient connected to face mask oxygen  Post-op Assessment: Report given to RN, Post -op Vital signs reviewed and stable and Patient moving all extremities  Post vital signs: Reviewed and stable  Last Vitals:  Vitals Value Taken Time  BP 150/93 07/13/19 1015  Temp 36.6 C 07/13/19 1007  Pulse 82 07/13/19 1015  Resp 14 07/13/19 1015  SpO2 97 % 07/13/19 1015  Vitals shown include unvalidated device data.  Last Pain:  Vitals:   07/13/19 1007  TempSrc:   PainSc: 0-No pain      Patients Stated Pain Goal: 4 (85/46/27 0350)  Complications: No complications documented.

## 2019-07-13 NOTE — Op Note (Signed)
Operative Note  07/13/2019  10:19 AM  PATIENT:  Maria Frank  44 y.o. female  PRE-OPERATIVE DIAGNOSIS:  Paragard intrauterine device migrated to lower uterine segment with arms incarcerated in the myometrium.  POST-OPERATIVE DIAGNOSIS:  Paragard intrauterine device migrated to lower uterine segment with arms incarcerated in the myometrium.  PROCEDURE:  Procedure(s): DILATION OF CERVIX, HYSTEROSCOPY, REMOVAL OF INCARCERATED PARAGARD INTRAUTERINE DEVICE  SURGEON:  Surgeon(s): [name], MD  ANESTHESIA:   general  FINDINGS: Incarcerated Paragard Intrauterine device in the myometrium of the lower uterine segment.  Normal intrauterine cavity.  DESCRIPTION OF OPERATION: Under general anesthesia with laryngeal mask, the patient is in lithotomy position.  She is prepped with Betadine on the suprapubic, vulvar and vaginal areas.  She is draped as usual.  The bladder is catheterized.  Timeout is done.  The vaginal exam reveals an anteverted uterus, normal volume and mobile.  No adnexal mass.  The speculum is inserted in the vagina and the anterior lip of the cervix is grasped with a tenaculum.  The IUD strings are visible at the exocervix.  A paracervical block is done with Nesacaine 1% a total of 20 cc at 4 and 8:00.  Dilation of the cervix with Pratt dilators up to #19 without difficulty.  The hysteroscope was inserted in the intra uterine cavity.  Both ostia are seen.  The ParaGard IUD is in the lower uterine segment with both arms entering the myometrium.  Pictures are taken.  The hysteroscopic grasper is inserted.  The IUD arm on the right side is grasped and released from the myometrium intact.  The left arm is not readily accessible.  We grasped the vertical part of the IUD and remove the IUD at the same time as the hysteroscope is removed.  The IUD is in complete with 1 arm missing.  We therefore go back with the hysteroscope and visualize the remaining arm which is grasped with a grasper  and easily removed.  Pictures are taken after removal of the ParaGard IUD.  Hemostasis is adequate.  The hysteroscope with grasper are removed.  The tenaculum was removed from the cervix.  Hemostasis is adequate.  The speculum is removed.  We take a picture of the complete ParaGard IUD on the surgical table.  The IUD is then discarded.  The patient is brought to recovery room in good and stable status.  ESTIMATED BLOOD LOSS: 3 mL Fluid Deficit: 110 cc  Intake/Output Summary (Last 24 hours) at 07/13/2019 1019 Last data filed at 07/13/2019 1014 Gross per 24 hour  Intake 1000 ml  Output 103 ml  Net 897 ml     BLOOD ADMINISTERED:none   LOCAL MEDICATIONS USED:  Nesacaine 1% Amount: 20 ml  SPECIMEN:  Source of Specimen:  Paragard IUD  DISPOSITION OF SPECIMEN:  Picture taken and Paragard IUD discarded  COUNTS:  YES  PLAN OF CARE: Transfer to PACU  Marie-Lyne LavoieMD10:19 AM

## 2019-07-13 NOTE — Discharge Instructions (Addendum)
Hysteroscopy, Care After This sheet gives you information about how to care for yourself after your procedure. Your health care provider may also give you more specific instructions. If you have problems or questions, contact your health care provider. What can I expect after the procedure? After the procedure, it is common to have:  Cramping.  Bleeding. This can vary from light spotting to menstrual-like bleeding. Follow these instructions at home: Activity  Rest for 1-2 days after the procedure.  Do not douche, use tampons, or have sex for 2 weeks after the procedure, or until your health care provider approves.  Do not drive for 24 hours after the procedure, or for as long as told by your health care provider.  Do not drive, use heavy machinery, or drink alcohol while taking prescription pain medicines. Medicines   Take over-the-counter and prescription medicines only as told by your health care provider.  Do not take aspirin during recovery. It can increase the risk of bleeding. General instructions  Do not take baths, swim, or use a hot tub until your health care provider approves. Take showers instead of baths for 2 weeks, or for as long as told by your health care provider.  To prevent or treat constipation while you are taking prescription pain medicine, your health care provider may recommend that you: ? Drink enough fluid to keep your urine clear or pale yellow. ? Take over-the-counter or prescription medicines. ? Eat foods that are high in fiber, such as fresh fruits and vegetables, whole grains, and beans. ? Limit foods that are high in fat and processed sugars, such as fried and sweet foods.  Keep all follow-up visits as told by your health care provider. This is important. Contact a health care provider if:  You feel dizzy or lightheaded.  You feel nauseous.  You have abnormal vaginal discharge.  You have a rash.  You have pain that does not get better with  medicine.  You have chills. Get help right away if:  You have bleeding that is heavier than a normal menstrual period.  You have a fever.  You have pain or cramps that get worse.  You develop new abdominal pain.  You faint.  You have pain in your shoulders.  You have shortness of breath. Summary  After the procedure, you may have cramping and some vaginal bleeding.  Do not douche, use tampons, or have sex for 2 weeks after the procedure, or until your health care provider approves.  Do not take baths, swim, or use a hot tub until your health care provider approves. Take showers instead of baths for 2 weeks, or for as long as told by your health care provider.  Report any unusual symptoms to your health care provider.  Keep all follow-up visits as told by your health care provider. This is important. This information is not intended to replace advice given to you by your health care provider. Make sure you discuss any questions you have with your health care provider. Document Revised: 12/31/2016 Document Reviewed: 02/17/2016 Elsevier Patient Education  2020 [employer]  Post Anesthesia Home Care Instructions  Activity: Get plenty of rest for the remainder of the day. A responsible individual must stay with you for 24 hours following the procedure.  For the next 24 hours, DO NOT: -Drive a car -Operate machinery -Drink alcoholic beverages -Take any medication unless instructed by your physician -Make any legal decisions or sign important papers.  Meals: Start with liquid foods such   as gelatin or soup. Progress to regular foods as tolerated. Avoid greasy, spicy, heavy foods. If nausea and/or vomiting occur, drink only clear liquids until the nausea and/or vomiting subsides. Call your physician if vomiting continues.  Special Instructions/Symptoms: Your throat may feel dry or sore from the anesthesia or the breathing tube placed in your throat during surgery. If this  causes discomfort, gargle with warm salt water. The discomfort should disappear within 24 hours.  If you had a scopolamine patch placed behind your ear for the management of post- operative nausea and/or vomiting:  1. The medication in the patch is effective for 72 hours, after which it should be removed.  Wrap patch in a tissue and discard in the trash. Wash hands thoroughly with soap and water. 2. You may remove the patch earlier than 72 hours if you experience unpleasant side effects which may include dry mouth, dizziness or visual disturbances. 3. Avoid touching the patch. Wash your hands with soap and water after contact with the patch.     

## 2019-07-13 NOTE — Anesthesia Postprocedure Evaluation (Signed)
Anesthesia Post Note  Patient: Maria Frank  Procedure(s) Performed: DILATION OF CERVIX, HYSTEROSCOPY, REMOVAL OF INCARCERATED PARAGARD INTRAUTERINE DEVICE (N/A )     Patient location during evaluation: PACU Anesthesia Type: General Level of consciousness: awake and sedated Pain management: pain level controlled Respiratory status: spontaneous breathing Cardiovascular status: stable Postop Assessment: no apparent nausea or vomiting Anesthetic complications: no   No complications documented.  Last Vitals:  Vitals:   07/13/19 0830 07/13/19 1007  BP: 133/87 (!) 142/86  Pulse: 79 94  Resp: (!) 24 14  Temp: 37.1 C 36.6 C  SpO2: 98% 97%    Last Pain:  Vitals:   07/13/19 1007  TempSrc:   PainSc: 0-No pain                 Huston Foley

## 2019-07-13 NOTE — H&P (Signed)
Maria Frank is an 44 y.o. female.G0 Married  RP: Incarcerated Paragard IUD for removal under Hysteroscopy  HPI: No change x last visit on 06/14/2019. Well on Paragard IUD x 11/2012, but incarcerated in the myometrium at the lower uterine segment, unsuccessful removal in office. No pelvic pain. Mensesmore frequent every 21-27 days, increased flow and dysmenorrhea. Not sexually active x45yrs, but her and husband are now good with that.  Pertinent Gynecological History: Contraception: IUD Blood transfusions: none Sexually transmitted diseases: no past history Last mammogram: normal  Last pap: normal  OB History: G0  Menstrual History:  Patient's last menstrual period was 06/24/2019 (exact date).    Past Medical History:  Diagnosis Date  . ALLERGIC RHINITIS   . CHICKENPOX, HX OF   . DEPRESSION   . Heart murmur   . HYPERLIPIDEMIA    diet controlled  . IBS (irritable bowel syndrome)   . Iron deficiency anemia   . Low blood pressure   . Migraine   . Obesity   . Recurrent cold sores   . Renal cyst   . Wears glasses    reading    Past Surgical History:  Procedure Laterality Date  . BUNIONECTOMY Bilateral   . COLONOSCOPY  11/2016  . NO PAST SURGERIES      Family History  Problem Relation Age of Onset  . Hypertension Father   . Cardiomyopathy Father   . Sleep apnea Father   . Cancer Mother        pancreatic, liver and renal   . Osteoporosis Mother   . Renal cancer Mother   . Hypertension Paternal Grandfather   . Arthritis Other        parent & grandparents  . Breast cancer Maternal Aunt 60  . Breast cancer Cousin 30  . Asthma Sister   . Sleep apnea Brother   . Asthma Sister   . Sleep apnea Brother   . Stomach cancer Neg Hx   . Rectal cancer Neg Hx   . Esophageal cancer Neg Hx   . Colon cancer Neg Hx     Social History:  reports that she has never smoked. She has never used smokeless tobacco. She reports current alcohol use. She reports that she does  not use drugs.  Allergies:  Allergies  Allergen Reactions  . Latex   . Oxycodone     Nausea, dizziness and shaking.    Medications Prior to Admission  Medication Sig Dispense Refill Last Dose  . EPIPEN 2-PAK 0.3 MG/0.3ML SOAJ injection Inject 0.3 mLs (0.3 mg total) into the skin once as needed. 1 Device    . metoCLOPramide (REGLAN) 10 MG tablet Take 5 mg by mouth as needed.      . zonisamide (ZONEGRAN) 100 MG capsule Take 150 mg by mouth every evening.        REVIEW OF SYSTEMS: A ROS was performed and pertinent positives and negatives are included in the history.  GENERAL: No fevers or chills. HEENT: No change in vision, no earache, sore throat or sinus congestion. NECK: No pain or stiffness. CARDIOVASCULAR: No chest pain or pressure. No palpitations. PULMONARY: No shortness of breath, cough or wheeze. GASTROINTESTINAL: No abdominal pain, nausea, vomiting or diarrhea, melena or bright red blood per rectum. GENITOURINARY: No urinary frequency, urgency, hesitancy or dysuria. MUSCULOSKELETAL: No joint or muscle pain, no back pain, no recent trauma. DERMATOLOGIC: No rash, no itching, no lesions. ENDOCRINE: No polyuria, polydipsia, no heat or cold intolerance. No recent change in weight. HEMATOLOGICAL:  No anemia or easy bruising or bleeding. NEUROLOGIC: No headache, seizures, numbness, tingling or weakness. PSYCHIATRIC: No depression, no loss of interest in normal activity or change in sleep pattern.     Height 5\' 3"  (1.6 m), weight 79.4 kg, last menstrual period 06/24/2019.  Physical Exam:  See office notes  Covid Negative  Pelvic US 06/14/2019: Nonlatex probe cover used. T/V images. Anteverted uterus normal in size and shape with several intramural and subserosal fibroids, one adjacent to the endometrial cavity. The uterus is measured at 8.84 x 5.06 x 3.87 cm. 4 fibroids are measured all below 2 cm. The endometrial lining is symmetrical with no mass or thickening seen. The  endometrial lining is measured at 7.81 mm. The IUD is displaced into the lower uterine segment with the arms protruding into the myometrium. Both ovaries are normal in size with normal follicular pattern. A 1.8 cm resolving corpus luteum cyst is seen on the left ovary. Patient is cycle day #21. No adnexal mass. No free fluid in the posterior cul-de-sac.   Assessment/Plan:  44 y.o. G0P0000   1. Intrauterine device (IUD) migration, subsequent encounter Attempted removal, but given resistance, decision to remove under Hysteroscopy. Scheduled for Cervical Dilation/HSC Shirlean Mylar of migrated Paragard IUD. Preop preparation, surgical procedure with risks/benefits and post op expectations and precautions reviewed.                           Patient was counseled as to the risk of surgery to include the following:  1. Infection (prohylactic antibiotics will be administered)  2. DVT/Pulmonary Embolism (prophylactic pneumo compression stockings will be used)  3.Trauma to internal organs requiring additional surgical procedure to repair any injury to internal organs requiring perhaps additional hospitalization days.  4.Hemmorhage requiring transfusion and blood products which carry risks such as anaphylactic reaction, hepatitis and AIDS.  Patient had received literature information on the procedure scheduled and all her questions were answered and fully accepts all risk.    Marie-Lyne Zuma Hust 07/13/2019, 8:08 AM

## 2019-07-16 ENCOUNTER — Encounter (HOSPITAL_BASED_OUTPATIENT_CLINIC_OR_DEPARTMENT_OTHER): Payer: Self-pay | Admitting: Obstetrics & Gynecology

## 2019-08-07 ENCOUNTER — Ambulatory Visit (INDEPENDENT_AMBULATORY_CARE_PROVIDER_SITE_OTHER): Payer: No Typology Code available for payment source | Admitting: Obstetrics & Gynecology

## 2019-08-07 ENCOUNTER — Encounter: Payer: Self-pay | Admitting: Obstetrics & Gynecology

## 2019-08-07 ENCOUNTER — Other Ambulatory Visit: Payer: Self-pay

## 2019-08-07 VITALS — BP 130/80

## 2019-08-07 DIAGNOSIS — Z3009 Encounter for other general counseling and advice on contraception: Secondary | ICD-10-CM

## 2019-08-07 DIAGNOSIS — Z302 Encounter for sterilization: Secondary | ICD-10-CM | POA: Diagnosis not present

## 2019-08-07 DIAGNOSIS — Z09 Encounter for follow-up examination after completed treatment for conditions other than malignant neoplasm: Secondary | ICD-10-CM

## 2019-08-07 NOTE — Progress Notes (Signed)
    Maria Frank 1975-08-12 [id number]        44 y.o.  G0 Married  RP: Postop HSC/Removal of incarcerated IUD  HPI: Doing very well since surgery.  No abdominal pelvic pain.  No vaginal bleeding.  No vaginal discharge.  No fever.  Urine and bowel movements normal.  Desires tubal sterilization.   OB History  Gravida Para Term Preterm AB Living  0 0 0 0 0 0  SAB TAB Ectopic Multiple Live Births  0 0 0 0      Past medical history,surgical history, problem list, medications, allergies, family history and social history were all reviewed and documented in the EPIC chart.   Directed ROS with pertinent positives and negatives documented in the history of present illness/assessment and plan.  Exam:  Vitals:   08/07/19 1416  BP: 130/80   General appearance:  Normal  Abdomen: Normal  Gynecologic exam: Vulva normal.  Bimanual exam: Uterus anteverted, normal volume and mobile, nontender.  No adnexal mass, nontender bilaterally.  No vaginal bleeding and normal secretions.   Assessment/Plan:  44 y.o. G0  1. Status post gynecological surgery, follow-up exam Very good healing and recovery post-op.  No complication.  2. Request for sterilization Desires bilateral tubal sterilization.  Decision to proceed with laparoscopy bilateral tubal sterilization by cauterization.  Preop preparation, procedure, risks and benefits as well as postop precautions and expectations thoroughly reviewed with patient.  The procedure had been discussed with patient previously.  Patient is certain to desire a sterilization procedure.  We will schedule as soon as possible.                        Patient was counseled as to the risk of surgery to include the following:  1. Infection (prohylactic antibiotics will be administered)  2. DVT/Pulmonary Embolism (prophylactic pneumo compression stockings will be used)  3.Trauma to internal organs requiring additional surgical procedure to repair any injury to  internal organs requiring perhaps additional hospitalization days.  4.Hemmorhage requiring transfusion and blood products which carry risks such as anaphylactic reaction, hepatitis and AIDS  Patient had received literature information on the procedure scheduled and all her questions were answered and fully accepts all risk.   [name] MD, 2:31 PM 08/07/2019

## 2019-08-08 ENCOUNTER — Encounter: Payer: Self-pay | Admitting: Obstetrics & Gynecology

## 2019-08-08 NOTE — Patient Instructions (Signed)
1. Status post gynecological surgery, follow-up exam Very good healing and recovery post-op.  No complication.  2. Request for sterilization Desires bilateral tubal sterilization.  Decision to proceed with laparoscopy bilateral tubal sterilization by cauterization.  Preop preparation, procedure, risks and benefits as well as postop precautions and expectations thoroughly reviewed with patient.  The procedure had been discussed with patient previously.  Patient is certain to desire a sterilization procedure.  We will schedule as soon as possible.                        Patient was counseled as to the risk of surgery to include the following:  1. Infection (prohylactic antibiotics will be administered)  2. DVT/Pulmonary Embolism (prophylactic pneumo compression stockings will be used)  3.Trauma to internal organs requiring additional surgical procedure to repair any injury to internal organs requiring perhaps additional hospitalization days.  4.Hemmorhage requiring transfusion and blood products which carry risks such as anaphylactic reaction, hepatitis and AIDS  Patient had received literature information on the procedure scheduled and all her questions were answered and fully accepts all risk.  Maria Frank, it was a pleasure seeing you today!

## 2019-08-21 ENCOUNTER — Telehealth: Payer: Self-pay

## 2019-08-21 NOTE — Telephone Encounter (Signed)
I spoke with patient to let her know Covid pre operative testing is not changing at this time. Will remain the same due to increase in cases at this time.  I scheduled her testing appointment and reviewed the quarantine protocol once again. I mailed her a handout with date/time and address on it.

## 2019-08-29 ENCOUNTER — Other Ambulatory Visit: Payer: Self-pay

## 2019-08-29 ENCOUNTER — Telehealth: Payer: Self-pay

## 2019-08-29 ENCOUNTER — Encounter (HOSPITAL_BASED_OUTPATIENT_CLINIC_OR_DEPARTMENT_OTHER): Payer: Self-pay | Admitting: Obstetrics & Gynecology

## 2019-08-29 NOTE — Progress Notes (Signed)
Spoke w/ via phone for pre-op interview---Pt Lab needs dos----    Cbc, urine preg           Lab results------none COVID test ------08-31-2019 1010 Arrive at -------630 am 09-04-2019 NPO after MN NO Solid Food.  Clear liquids from MN until---530 am then npo Medications to take morning of surgery -----none Diabetic medication -----n/a Patient Special Instructions ----none- Pre-Op special Istructions -----none Patient verbalized understanding of instructions that were given at this phone interview. Patient denies shortness of breath, chest pain, fever, cough a this phone interview.

## 2019-08-29 NOTE — Telephone Encounter (Signed)
I called patient and let her know case before her has cancelled and I can schedule her for 8:30am. She was fine with that. She was instructed to check in 2 hours early at 6:30am.

## 2019-08-31 ENCOUNTER — Other Ambulatory Visit (HOSPITAL_COMMUNITY)
Admission: RE | Admit: 2019-08-31 | Discharge: 2019-08-31 | Disposition: A | Discharge status: Home / Self Care | Payer: No Typology Code available for payment source | Source: Ambulatory Visit | Attending: Obstetrics & Gynecology | Admitting: Obstetrics & Gynecology

## 2019-08-31 DIAGNOSIS — Z01812 Encounter for preprocedural laboratory examination: Secondary | ICD-10-CM | POA: Diagnosis present

## 2019-08-31 DIAGNOSIS — Z20822 Contact with and (suspected) exposure to covid-19: Secondary | ICD-10-CM | POA: Insufficient documentation

## 2019-08-31 LAB — SARS CORONAVIRUS 2 (TAT 6-24 HRS): SARS Coronavirus 2: NEGATIVE

## 2019-09-03 ENCOUNTER — Ambulatory Visit (INDEPENDENT_AMBULATORY_CARE_PROVIDER_SITE_OTHER): Payer: No Typology Code available for payment source | Admitting: Internal Medicine

## 2019-09-03 ENCOUNTER — Other Ambulatory Visit: Payer: Self-pay

## 2019-09-03 ENCOUNTER — Encounter: Payer: Self-pay | Admitting: Internal Medicine

## 2019-09-03 VITALS — BP 130/70 | HR 65 | Temp 97.3°F | Ht 63.0 in | Wt 177.6 lb

## 2019-09-03 DIAGNOSIS — G4730 Sleep apnea, unspecified: Secondary | ICD-10-CM | POA: Diagnosis not present

## 2019-09-03 DIAGNOSIS — F5101 Primary insomnia: Secondary | ICD-10-CM

## 2019-09-03 DIAGNOSIS — E669 Obesity, unspecified: Secondary | ICD-10-CM | POA: Diagnosis not present

## 2019-09-03 NOTE — Progress Notes (Signed)
09/03/19- 30 yoF never smoker for sleep evaluation courtesy of Dr Orie Rout at [facility].with concern of Nightmares and Insomnia. Previous meds have included alprazolam, lorazepam, melatonin, zolpidem, antihistamines, excedrin, opiates,  Medical problem list includes Allergic Rhinitis, Chronic Migraine, Depression, Hypercholesterolemia, Irritable Bowel  She complains of lifelong insomnia, saying she was middle of 9 children and shared bed with sibs when Maria Frank. Bedtime 10:30-11PM, sleep latency 30-45 minutes, waking 3-5 times and sometimes unable to regain sleep. Up at 6:30 AM. Naps around 5x/ month may help a little.  Husband sleeps soundly with little report, but she is aware she talks in sleep, lucid dreams. Says sleep not restful x 20 years. Not aware of significant snoring, sleep walkoing. Sleeps better with Zonegran 200 mg in recent months.  Little caffeine. Adjustable bed is comfortable. Des cribes good sleep environment and is aware of standard sleep hygiene recs.  Has done Cognitive Behavioral Therapy, Yoga, and understands the role of stress. Had sleep study 2004 in [place]. Not told OSA. Ambien gave nightmares.  Denies ENT surgery heart or lung disease except hx murmur. 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   Prior to Admission medications   Medication Sig Start Date End Date Taking? Authorizing Provider  EPIPEN 2-PAK 0.3 MG/0.3ML SOAJ injection Inject 0.3 mLs (0.3 mg total) into the skin once as needed. 03/26/14  Yes Rowe Clack, MD  metoCLOPramide (REGLAN) 10 MG tablet Take 5 mg by mouth as needed.    Yes [provider]  zonisamide (ZONEGRAN) 100 MG capsule Take 200 mg by mouth every evening.    Yes [provider]  HYDROcodone-acetaminophen (NORCO) 5-325 MG tablet Take 1 tablet by mouth every 6 (six) hours as needed for severe pain. 09/07/19   Joseph Pierini, MD   Past Medical History:  Diagnosis Date  .  ALLERGIC RHINITIS   . CHICKENPOX, HX OF    x 2  . DEPRESSION   . Heart murmur    small no cardiologist  . HYPERLIPIDEMIA    diet controlled  . IBS (irritable bowel syndrome)   . Iron deficiency anemia   . Migraine   . Obesity   . Recurrent cold sores   . Renal cyst   . Wears glasses    reading   Past Surgical History:  Procedure Laterality Date  . BUNIONECTOMY Bilateral feb 2020, and nov 2020  . COLONOSCOPY  11/2016  . HYSTEROSCOPY N/A 07/13/2019   Procedure: DILATION OF CERVIX, HYSTEROSCOPY, REMOVAL OF INCARCERATED PARAGARD INTRAUTERINE DEVICE;  Surgeon: [name], MD;  Location: [place];  Service: Gynecology;  Laterality: N/A;  . LAPAROSCOPIC TUBAL LIGATION Bilateral 09/04/2019   Procedure: LAPAROSCOPIC TUBAL LIGATION BY CAUTERIZATION;  Surgeon: [name], MD;  Location: [place];  Service: Gynecology;  Laterality: Bilateral;  request to follow in [facility] block time- requests 30 minutes OR time  . NO PAST SURGERIES     Family History  Problem Relation Age of Onset  . Hypertension Father   . Cardiomyopathy Father   . Sleep apnea Father   . Cancer Mother        pancreatic, liver and renal   . Osteoporosis Mother   . Renal cancer Mother   . Hypertension Paternal Grandfather   . Arthritis Other        parent & grandparents  . Breast cancer Maternal Aunt 60  . Breast cancer Cousin 30  . Asthma Sister   .  Sleep apnea Brother   . Asthma Sister   . Sleep apnea Brother   . Stomach cancer Neg Hx   . Rectal cancer Neg Hx   . Esophageal cancer Neg Hx   . Colon cancer Neg Hx    Social History   Socioeconomic History  . Marital status: Married    Spouse name: Not on file  . Number of children: Not on file  . Years of education: Not on file  . Highest education level: Not on file  Occupational History  . Not on file  Tobacco Use  . Smoking status: Never Smoker  . Smokeless tobacco: Never Used  Vaping Use  .  Vaping Use: Never used  Substance and Sexual Activity  . Alcohol use: Not Currently    Alcohol/week: 0.0 standard drinks  . Drug use: No  . Sexual activity: Yes    Partners: Male    Birth control/protection: None, I.U.D.  Other Topics Concern  . Not on file  Social History Narrative   Married since 2004, but separated 10/2012 from spouse. Originally from [place], in [place] since 2008. 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 Difficulty of Paying Living Expenses:   Food Insecurity:   . Worried About Charity fundraiser in the Last Year:   . Arboriculturist in the Last Year:   Transportation Needs:   . Film/video editor (Medical):   Marland Kitchen Lack of Transportation (Non-Medical):   Physical Activity:   . Days of Exercise per Week:   . Minutes of Exercise per Session:   Stress:   . Feeling of Stress :   Social Connections:   . Frequency of Communication with Friends and Family:   . Frequency of Social Gatherings with Friends and Family:   . Attends Religious Services:   . Active Member of Clubs or Organizations:   . Attends Archivist Meetings:   Marland Kitchen Marital Status:   Intimate Partner Violence:   . Fear of Current or Ex-Partner:   . Emotionally Abused:   Marland Kitchen Physically Abused:   . Sexually Abused:    ROS-see HPI   + = positive Constitutional:    weight loss, night sweats, fevers, chills, fatigue, lassitude. HEENT:    +headaches, difficulty swallowing, tooth/dental problems, sore throat,       +sneezing, itching, ear ache, +nasal congestion, post nasal drip, snoring CV:    chest pain, orthopnea, PND, swelling in lower extremities, anasarca,                                   dizziness, palpitations Resp:   shortness of breath with exertion or at rest.                productive cough,   non-productive cough, coughing up of blood.              change in color of mucus.  wheezing.   Skin:    rash or lesions. GI:   No-   heartburn, indigestion, abdominal pain, nausea, vomiting, diarrhea,                 change in bowel habits, +loss of appetite GU: dysuria, change in color of urine, no urgency or frequency.   flank pain. MS:   joint pain, stiffness, decreased range of motion, back pain. Neuro-  nothing unusual Psych:  change in mood or affect.  depression or anxiety.   memory loss.  OBJ- Physical Exam General- Alert, Oriented, Affect-appropriate, Distress- none acute, + overweight Skin- rash-none, lesions- none, excoriation- none Lymphadenopathy- none Head- atraumatic            Eyes- Gross vision intact, PERRLA, conjunctivae and secretions clear            Ears- Hearing, canals-normal            Nose- Clear, no-Septal dev, mucus, polyps, erosion, perforation             Throat- Mallampati III , mucosa clear , drainage- none, tonsils- atrophic Neck- flexible , trachea midline, no stridor , thyroid nl, carotid no bruit Chest - symmetrical excursion , unlabored           Heart/CV- RRR , no murmur , no gallop  , no rub, nl s1 s2                           - JVD- none , edema- none, stasis changes- none, varices- none           Lung- clear to P&A, wheeze- none, cough- none , dullness-none, rub- none           Chest wall-  Abd-  Br/ Gen/ Rectal- Not done, not indicated Extrem- cyanosis- none, clubbing, none, atrophy- none, strength- nl Neuro- grossly intact to observation

## 2019-09-03 NOTE — Patient Instructions (Signed)
Order- schedule Home Sleep Test   Dx Sleep disordered breathing  Please call if we can help

## 2019-09-04 ENCOUNTER — Encounter (HOSPITAL_BASED_OUTPATIENT_CLINIC_OR_DEPARTMENT_OTHER)
Admission: RE | Disposition: A | Discharge status: Home / Self Care | Payer: Self-pay | Source: Home / Self Care | Attending: Obstetrics & Gynecology

## 2019-09-04 ENCOUNTER — Ambulatory Visit (HOSPITAL_BASED_OUTPATIENT_CLINIC_OR_DEPARTMENT_OTHER): Payer: No Typology Code available for payment source | Admitting: Anesthesiology

## 2019-09-04 ENCOUNTER — Encounter (HOSPITAL_BASED_OUTPATIENT_CLINIC_OR_DEPARTMENT_OTHER): Payer: Self-pay | Admitting: Obstetrics & Gynecology

## 2019-09-04 ENCOUNTER — Ambulatory Visit (HOSPITAL_BASED_OUTPATIENT_CLINIC_OR_DEPARTMENT_OTHER)
Admission: RE | Admit: 2019-09-04 | Discharge: 2019-09-04 | Disposition: A | Discharge status: Home / Self Care | Payer: No Typology Code available for payment source | Attending: Obstetrics & Gynecology | Admitting: Obstetrics & Gynecology

## 2019-09-04 DIAGNOSIS — Z79899 Other long term (current) drug therapy: Secondary | ICD-10-CM | POA: Insufficient documentation

## 2019-09-04 DIAGNOSIS — Z6831 Body mass index (BMI) 31.0-31.9, adult: Secondary | ICD-10-CM | POA: Insufficient documentation

## 2019-09-04 DIAGNOSIS — Z302 Encounter for sterilization: Secondary | ICD-10-CM | POA: Insufficient documentation

## 2019-09-04 DIAGNOSIS — E669 Obesity, unspecified: Secondary | ICD-10-CM | POA: Diagnosis not present

## 2019-09-04 HISTORY — PX: LAPAROSCOPIC TUBAL LIGATION: SHX1937

## 2019-09-04 LAB — CBC
HCT: 39.6 % (ref 36.0–46.0)
Hemoglobin: 13.1 g/dL (ref 12.0–15.0)
MCH: 31.1 pg (ref 26.0–34.0)
MCHC: 33.1 g/dL (ref 30.0–36.0)
MCV: 94.1 fL (ref 80.0–100.0)
Platelets: 258 10*3/uL (ref 150–400)
RBC: 4.21 MIL/uL (ref 3.87–5.11)
RDW: 12.9 % (ref 11.5–15.5)
WBC: 7.6 10*3/uL (ref 4.0–10.5)
nRBC: 0 % (ref 0.0–0.2)

## 2019-09-04 LAB — POCT PREGNANCY, URINE: Preg Test, Ur: NEGATIVE

## 2019-09-04 SURGERY — LIGATION, FALLOPIAN TUBE, LAPAROSCOPIC
Anesthesia: General | Site: Pelvis | Laterality: Bilateral

## 2019-09-04 MED ORDER — KETOROLAC TROMETHAMINE 30 MG/ML IJ SOLN
INTRAMUSCULAR | Status: AC
  Filled 2019-09-04: qty 1

## 2019-09-04 MED ORDER — MIDAZOLAM HCL 2 MG/2ML IJ SOLN
INTRAMUSCULAR | Status: AC
  Filled 2019-09-04: qty 2

## 2019-09-04 MED ORDER — CEFAZOLIN SODIUM-DEXTROSE 2-4 GM/100ML-% IV SOLN
2.0000 g | Freq: Once | INTRAVENOUS | Status: AC
  Administered 2019-09-04: 2 g via INTRAVENOUS

## 2019-09-04 MED ORDER — GLYCOPYRROLATE PF 0.2 MG/ML IJ SOSY
PREFILLED_SYRINGE | INTRAMUSCULAR | Status: AC
  Filled 2019-09-04: qty 1

## 2019-09-04 MED ORDER — DEXAMETHASONE SODIUM PHOSPHATE 10 MG/ML IJ SOLN
INTRAMUSCULAR | Status: DC | PRN
  Administered 2019-09-04 (×2): 5 mg via INTRAVENOUS

## 2019-09-04 MED ORDER — ROCURONIUM BROMIDE 10 MG/ML (PF) SYRINGE
PREFILLED_SYRINGE | INTRAVENOUS | Status: AC
  Filled 2019-09-04: qty 10

## 2019-09-04 MED ORDER — PROMETHAZINE HCL 25 MG/ML IJ SOLN: 6.2500 mg | INTRAMUSCULAR | Status: DC | PRN

## 2019-09-04 MED ORDER — WHITE PETROLATUM EX OINT
TOPICAL_OINTMENT | CUTANEOUS | Status: AC
  Filled 2019-09-04: qty 5

## 2019-09-04 MED ORDER — POVIDONE-IODINE 10 % EX SWAB: 2.0000 "application " | Freq: Once | CUTANEOUS | Status: DC

## 2019-09-04 MED ORDER — ONDANSETRON HCL 4 MG/2ML IJ SOLN
INTRAMUSCULAR | Status: AC
  Filled 2019-09-04: qty 2

## 2019-09-04 MED ORDER — BUPIVACAINE HCL (PF) 0.25 % IJ SOLN
INTRAMUSCULAR | Status: DC | PRN
  Administered 2019-09-04: 7 mL

## 2019-09-04 MED ORDER — SUGAMMADEX SODIUM 200 MG/2ML IV SOLN
INTRAVENOUS | Status: DC | PRN
  Administered 2019-09-04: 160 mg via INTRAVENOUS

## 2019-09-04 MED ORDER — CEFAZOLIN SODIUM-DEXTROSE 2-4 GM/100ML-% IV SOLN
INTRAVENOUS | Status: AC
  Filled 2019-09-04: qty 100

## 2019-09-04 MED ORDER — LIDOCAINE 2% (20 MG/ML) 5 ML SYRINGE
INTRAMUSCULAR | Status: AC
  Filled 2019-09-04: qty 5

## 2019-09-04 MED ORDER — MEPERIDINE HCL 25 MG/ML IJ SOLN: 6.2500 mg | INTRAMUSCULAR | Status: DC | PRN

## 2019-09-04 MED ORDER — FENTANYL CITRATE (PF) 100 MCG/2ML IJ SOLN
25.0000 ug | INTRAMUSCULAR | Status: DC | PRN
  Administered 2019-09-04: 25 ug via INTRAVENOUS

## 2019-09-04 MED ORDER — PROPOFOL 10 MG/ML IV BOLUS
INTRAVENOUS | Status: DC | PRN
  Administered 2019-09-04: 140 mg via INTRAVENOUS

## 2019-09-04 MED ORDER — PROPOFOL 10 MG/ML IV BOLUS
INTRAVENOUS | Status: AC
  Filled 2019-09-04: qty 20

## 2019-09-04 MED ORDER — ACETAMINOPHEN 500 MG PO TABS
1000.0000 mg | ORAL_TABLET | Freq: Once | ORAL | Status: AC
  Administered 2019-09-04: 1000 mg via ORAL

## 2019-09-04 MED ORDER — ROCURONIUM BROMIDE 10 MG/ML (PF) SYRINGE
PREFILLED_SYRINGE | INTRAVENOUS | Status: DC | PRN
  Administered 2019-09-04: 40 mg via INTRAVENOUS
  Administered 2019-09-04: 10 mg via INTRAVENOUS

## 2019-09-04 MED ORDER — FENTANYL CITRATE (PF) 100 MCG/2ML IJ SOLN
INTRAMUSCULAR | Status: AC
  Filled 2019-09-04: qty 2

## 2019-09-04 MED ORDER — FENTANYL CITRATE (PF) 100 MCG/2ML IJ SOLN
INTRAMUSCULAR | Status: DC | PRN
  Administered 2019-09-04 (×2): 50 ug via INTRAVENOUS

## 2019-09-04 MED ORDER — LACTATED RINGERS IV SOLN
INTRAVENOUS | Status: DC
  Administered 2019-09-04: 1000 mL via INTRAVENOUS

## 2019-09-04 MED ORDER — GLYCOPYRROLATE PF 0.2 MG/ML IJ SOSY
PREFILLED_SYRINGE | INTRAMUSCULAR | Status: DC | PRN
Start: 2019-09-04 — End: 2019-09-04
  Administered 2019-09-04: .2 mg via INTRAVENOUS

## 2019-09-04 MED ORDER — ONDANSETRON HCL 4 MG/2ML IJ SOLN
INTRAMUSCULAR | Status: DC | PRN
  Administered 2019-09-04: 4 mg via INTRAVENOUS

## 2019-09-04 MED ORDER — LIDOCAINE 2% (20 MG/ML) 5 ML SYRINGE
INTRAMUSCULAR | Status: DC | PRN
  Administered 2019-09-04: 100 mg via INTRAVENOUS

## 2019-09-04 MED ORDER — ACETAMINOPHEN 500 MG PO TABS
ORAL_TABLET | ORAL | Status: AC
  Filled 2019-09-04: qty 2

## 2019-09-04 MED ORDER — LACTATED RINGERS IV SOLN: INTRAVENOUS | Status: DC

## 2019-09-04 MED ORDER — MIDAZOLAM HCL 2 MG/2ML IJ SOLN
INTRAMUSCULAR | Status: DC | PRN
  Administered 2019-09-04: 2 mg via INTRAVENOUS

## 2019-09-04 MED ORDER — DEXAMETHASONE SODIUM PHOSPHATE 10 MG/ML IJ SOLN
INTRAMUSCULAR | Status: AC
  Filled 2019-09-04: qty 1

## 2019-09-04 MED ORDER — KETOROLAC TROMETHAMINE 30 MG/ML IJ SOLN
INTRAMUSCULAR | Status: DC | PRN
Start: 2019-09-04 — End: 2019-09-04
  Administered 2019-09-04: 30 mg via INTRAVENOUS

## 2019-09-04 SURGICAL SUPPLY — 38 items
ADH SKN CLS APL DERMABOND .7 (GAUZE/BANDAGES/DRESSINGS) ×1
APL SWBSTK 6 STRL LF DISP (MISCELLANEOUS)
APPLICATOR COTTON TIP 6 STRL (MISCELLANEOUS) IMPLANT
APPLICATOR COTTON TIP 6IN STRL (MISCELLANEOUS)
CATH ROBINSON RED A/P 16FR (CATHETERS) IMPLANT
CATH SILICONE 14FRX5CC (CATHETERS) IMPLANT
CATH SILICONE 16FRX5CC (CATHETERS) ×3 IMPLANT
CNTNR URN SCR LID CUP LEK RST (MISCELLANEOUS) ×1 IMPLANT
CONT SPEC 4OZ STRL OR WHT (MISCELLANEOUS) ×3
COVER WAND RF STERILE (DRAPES) ×3 IMPLANT
DERMABOND ADVANCED (GAUZE/BANDAGES/DRESSINGS) ×2
DERMABOND ADVANCED .7 DNX12 (GAUZE/BANDAGES/DRESSINGS) ×1 IMPLANT
DRSG OPSITE POSTOP 3X4 (GAUZE/BANDAGES/DRESSINGS) IMPLANT
DURAPREP 26ML APPLICATOR (WOUND CARE) ×3 IMPLANT
ELECT REM PT RETURN 9FT ADLT (ELECTROSURGICAL) ×3
ELECTRODE REM PT RTRN 9FT ADLT (ELECTROSURGICAL) ×1 IMPLANT
GAUZE 4X4 16PLY RFD (DISPOSABLE) ×3 IMPLANT
GLOVE BIO SURGEON STRL SZ 6.5 (GLOVE) IMPLANT
GLOVE BIO SURGEON STRL SZ7.5 (GLOVE) IMPLANT
GLOVE BIO SURGEONS STRL SZ 6.5 (GLOVE)
GLOVE BIOGEL PI IND STRL 7.0 (GLOVE) ×2 IMPLANT
GLOVE BIOGEL PI INDICATOR 7.0 (GLOVE) ×4
GLOVE SURG SS PI 6.5 STRL IVOR (GLOVE) ×9 IMPLANT
GOWN STRL REUS W/TWL LRG LVL3 (GOWN DISPOSABLE) ×6 IMPLANT
GOWN STRL REUS W/TWL XL LVL3 (GOWN DISPOSABLE) IMPLANT
PACK LAPAROSCOPY BASIN (CUSTOM PROCEDURE TRAY) ×3 IMPLANT
PACK TRENDGUARD 450 HYBRID PRO (MISCELLANEOUS) IMPLANT
PAD OB MATERNITY 4.3X12.25 (PERSONAL CARE ITEMS) ×3 IMPLANT
PROTECTOR NERVE ULNAR (MISCELLANEOUS) IMPLANT
SET SUCTION IRRIG HYDROSURG (IRRIGATION / IRRIGATOR) IMPLANT
SET TUBE SMOKE EVAC HIGH FLOW (TUBING) ×3 IMPLANT
SUT MNCRL AB 4-0 PS2 18 (SUTURE) ×6 IMPLANT
SUT VICRYL 0 UR6 27IN ABS (SUTURE) ×6 IMPLANT
TOWEL OR 17X26 10 PK STRL BLUE (TOWEL DISPOSABLE) ×6 IMPLANT
TRENDGUARD 450 HYBRID PRO PACK (MISCELLANEOUS)
TROCAR BLADELESS OPT 5 100 (ENDOMECHANICALS) ×3 IMPLANT
TROCAR XCEL BLUNT TIP 100MML (ENDOMECHANICALS) ×3 IMPLANT
WARMER LAPAROSCOPE (MISCELLANEOUS) ×3 IMPLANT

## 2019-09-04 NOTE — H&P (Signed)
Maria Frank is an 44 y.o. female. G0 Married  RP: Laparoscopy with bilateral tubal sterilization by cauterization  HPI: Doing very well since removal of incarcerated IUD under [facility] guidance. No abdominal pelvic pain.  No vaginal bleeding.  No vaginal discharge.  No fever.  Urine and bowel movements normal.  Desires tubal sterilization.  Menstrual History: Patient's last menstrual period was 08/24/2019.    Past Medical History:  Diagnosis Date  . ALLERGIC RHINITIS   . CHICKENPOX, HX OF    x 2  . DEPRESSION   . Heart murmur    small no cardiologist  . HYPERLIPIDEMIA    diet controlled  . IBS (irritable bowel syndrome)   . Iron deficiency anemia   . Migraine   . Obesity   . Recurrent cold sores   . Renal cyst   . Wears glasses    reading    Past Surgical History:  Procedure Laterality Date  . BUNIONECTOMY Bilateral feb 2020, and nov 2020  . COLONOSCOPY  11/2016  . HYSTEROSCOPY N/A 07/13/2019   Procedure: DILATION OF CERVIX, HYSTEROSCOPY, REMOVAL OF INCARCERATED PARAGARD INTRAUTERINE DEVICE;  Surgeon: [name], MD;  Location: [place];  Service: Gynecology;  Laterality: N/A;  . NO PAST SURGERIES      Family History  Problem Relation Age of Onset  . Hypertension Father   . Cardiomyopathy Father   . Sleep apnea Father   . Cancer Mother        pancreatic, liver and renal   . Osteoporosis Mother   . Renal cancer Mother   . Hypertension Paternal Grandfather   . Arthritis Other        parent & grandparents  . Breast cancer Maternal Aunt 60  . Breast cancer Cousin 30  . Asthma Sister   . Sleep apnea Brother   . Asthma Sister   . Sleep apnea Brother   . Stomach cancer Neg Hx   . Rectal cancer Neg Hx   . Esophageal cancer Neg Hx   . Colon cancer Neg Hx     Social History:  reports that she has never smoked. She has never used smokeless tobacco. She reports previous alcohol use. She reports that she does not use drugs.  Allergies:   Allergies  Allergen Reactions  . Latex     Rash itching  . Oxycodone     Nausea, dizziness and shaking.    Medications Prior to Admission  Medication Sig Dispense Refill Last Dose  . metoCLOPramide (REGLAN) 10 MG tablet Take 5 mg by mouth as needed.    Past Week at Unknown time  . zonisamide (ZONEGRAN) 100 MG capsule Take 200 mg by mouth every evening.    09/03/2019 at Unknown time  . EPIPEN 2-PAK 0.3 MG/0.3ML SOAJ injection Inject 0.3 mLs (0.3 mg total) into the skin once as needed. 1 Device  Unknown at Unknown time    REVIEW OF SYSTEMS: A ROS was performed and pertinent positives and negatives are included in the history.  GENERAL: No fevers or chills. HEENT: No change in vision, no earache, sore throat or sinus congestion. NECK: No pain or stiffness. CARDIOVASCULAR: No chest pain or pressure. No palpitations. PULMONARY: No shortness of breath, cough or wheeze. GASTROINTESTINAL: No abdominal pain, nausea, vomiting or diarrhea, melena or bright red blood per rectum. GENITOURINARY: No urinary frequency, urgency, hesitancy or dysuria. MUSCULOSKELETAL: No joint or muscle pain, no back pain, no recent trauma. DERMATOLOGIC: No rash, no itching, no lesions. ENDOCRINE: No  polyuria, polydipsia, no heat or cold intolerance. No recent change in weight. HEMATOLOGICAL: No anemia or easy bruising or bleeding. NEUROLOGIC: No headache, seizures, numbness, tingling or weakness. PSYCHIATRIC: No depression, no loss of interest in normal activity or change in sleep pattern.     Blood pressure (!) 142/90, pulse 72, temperature 98.4 F (36.9 C), temperature source Oral, resp. rate 16, height 5\' 3"  (1.6 m), weight 80.2 kg, last menstrual period 08/24/2019, SpO2 100 %.   Results for orders placed or performed during the hospital encounter of 09/04/19 (from the past 24 hour(s))  Pregnancy, urine POC     Status: None   Collection Time: 09/04/19  6:49 AM  Result Value Ref Range   Preg Test, Ur NEGATIVE NEGATIVE   CBC     Status: None   Collection Time: 09/04/19  7:12 AM  Result Value Ref Range   WBC 7.6 4.0 - 10.5 K/uL   RBC 4.21 3.87 - 5.11 MIL/uL   Hemoglobin 13.1 12.0 - 15.0 g/dL   HCT 39.6 36 - 46 %   MCV 94.1 80.0 - 100.0 fL   MCH 31.1 26.0 - 34.0 pg   MCHC 33.1 30.0 - 36.0 g/dL   RDW 12.9 11.5 - 15.5 %   Platelets 258 150 - 400 K/uL   nRBC 0.0 0.0 - 0.2 %   Covid Negative  Exam in office 08/07/2019:  Abdomen: Normal  Gynecologic exam: Vulva normal.  Bimanual exam: Uterus anteverted, normal volume and mobile, nontender.  No adnexal mass, nontender bilaterally.  No vaginal bleeding and normal secretions.   Assessment/Plan:  44 y.o. G0  1. Status post gynecological surgery, follow-up exam Very good healing and recovery post-op.  No complication.  2. Request for sterilization Desires bilateral tubal sterilization.  Decision to proceed with laparoscopy bilateral tubal sterilization by cauterization.  Preop preparation, procedure, risks and benefits as well as postop precautions and expectations thoroughly reviewed with patient.  The procedure had been discussed with patient previously.  Patient is certain to desire a sterilization procedure.  We will schedule as soon as possible.                        Patient was counseled as to the risk of surgery to include the following:  1. Infection (prohylactic antibiotics will be administered)  2. DVT/Pulmonary Embolism (prophylactic pneumo compression stockings will be used)  3.Trauma to internal organs requiring additional surgical procedure to repair any injury to internal organs requiring perhaps additional hospitalization days.  4.Hemmorhage requiring transfusion and blood products which carry risks such as anaphylactic reaction, hepatitis and AIDS  Patient had received literature information on the procedure scheduled and all her questions were answered and fully accepts all risk.  Marie-Lyne Christorpher Hisaw 09/04/2019, 7:42 AM

## 2019-09-04 NOTE — Discharge Instructions (Addendum)
Laparoscopic Tubal Ligation, Care After This sheet gives you information about how to care for yourself after your procedure. Your health care provider may also give you more specific instructions. If you have problems or questions, contact your health care provider. What can I expect after the procedure? After the procedure, it is common to have:  A sore throat.  Discomfort in your shoulder.  Mild discomfort or cramping in your abdomen.  Gas pains.  Pain or soreness in the area where the surgical incision was made.  A bloated feeling.  Tiredness.  Nausea.  Vomiting. Follow these instructions at home: Medicines  Take over-the-counter and prescription medicines only as told by your health care provider.  Do not take aspirin because it can cause bleeding.  Ask your health care provider if the medicine prescribed to you: ? Requires you to avoid driving or using heavy machinery. ? Can cause constipation. You may need to take actions to prevent or treat constipation, such as:  Drink enough fluid to keep your urine pale yellow.  Take over-the-counter or prescription medicines.  Eat foods that are high in fiber, such as beans, whole grains, and fresh fruits and vegetables.  Limit foods that are high in fat and processed sugars, such as fried or sweet foods. Incision care      Follow instructions from your health care provider about how to take care of your incision. Make sure you: ? Wash your hands with soap and water before and after you change your bandage (dressing). If soap and water are not available, use hand sanitizer. ? Change your dressing as told by your health care provider. ? Leave stitches (sutures), skin glue, or adhesive strips in place. These skin closures may need to stay in place for 2 weeks or longer. If adhesive strip edges start to loosen and curl up, you may trim the loose edges. Do not remove adhesive strips completely unless your health care provider  tells you to do that.  Check your incision area every day for signs of infection. Check for: ? Redness, swelling, or pain. ? Fluid or blood. ? Warmth. ? Pus or a bad smell. Activity  Rest as told by your health care provider.  Avoid sitting for a long time without moving. Get up to take short walks every 1-2 hours. This is important to improve blood flow and breathing. Ask for help if you feel weak or unsteady.  Return to your normal activities as told by your health care provider. Ask your health care provider what activities are safe for you. General instructions  Do not take baths, swim, or use a hot tub until your health care provider approves. Ask your health care provider if you may take showers. You may only be allowed to take sponge baths.  Have someone help you with your daily household tasks for the first few days.  Keep all follow-up visits as told by your health care provider. This is important. Contact a health care provider if:  You have redness, swelling, or pain around your incision.  Your incision feels warm to the touch.  You have pus or a bad smell coming from your incision.  The edges of your incision break open after the sutures have been removed.  Your pain does not improve after 2-3 days.  You have a rash.  You repeatedly become dizzy or light-headed.  Your pain medicine is not helping. Get help right away if you:  Have a fever.  Faint.  Have increasing  pain in your abdomen.  Have severe pain in one or both of your shoulders.  Have fluid or blood coming from your sutures or from your vagina.  Have shortness of breath or difficulty breathing.  Have chest pain or leg pain.  Have ongoing nausea, vomiting, or diarrhea. Summary  After the procedure, it is common to have mild discomfort or cramping in your abdomen.  Take over-the-counter and prescription medicines only as told by your health care provider.  Watch for symptoms that should  prompt you to call your health care provider.  Keep all follow-up visits as told by your health care provider. This is important. This information is not intended to replace advice given to you by your health care provider. Make sure you discuss any questions you have with your health care provider. Document Revised: 06/27/2018 Document Reviewed: 12/13/2017 Elsevier Patient Education  [place] Instructions  Activity: Get plenty of rest for the remainder of the day. A responsible individual must stay with you for 24 hours following the procedure.  For the next 24 hours, DO NOT: -Drive a car -Paediatric nurse -Drink alcoholic beverages -Take any medication unless instructed by your physician -Make any legal decisions or sign important papers.  Meals: Start with liquid foods such as gelatin or soup. Progress to regular foods as tolerated. Avoid greasy, spicy, heavy foods. If nausea and/or vomiting occur, drink only clear liquids until the nausea and/or vomiting subsides. Call your physician if vomiting continues.  Special Instructions/Symptoms: Your throat may feel dry or sore from the anesthesia or the breathing tube placed in your throat during surgery. If this causes discomfort, gargle with warm salt water. The discomfort should disappear within 24 hours.  No ibuprofen, Advil, Aleve, Motrin, or naproxen until after 3:30 pm today if needed.

## 2019-09-04 NOTE — Anesthesia Preprocedure Evaluation (Addendum)
Anesthesia Evaluation  Patient identified by MRN, date of birth, ID band Patient awake    Reviewed: Allergy & Precautions, NPO status , Patient's Chart, lab work & pertinent test results  Airway Mallampati: I  TM Distance: >3 FB Neck ROM: Full    Dental no notable dental hx. (+) Teeth Intact, Dental Advisory Given   Pulmonary neg pulmonary ROS,    Pulmonary exam normal breath sounds clear to auscultation       Cardiovascular Normal cardiovascular exam+ Valvular Problems/Murmurs  Rhythm:Regular Rate:Normal     Neuro/Psych  Headaches, PSYCHIATRIC DISORDERS Depression    GI/Hepatic negative GI ROS, Neg liver ROS,   Endo/Other  negative endocrine ROS  Renal/GU Renal disease     Musculoskeletal negative musculoskeletal ROS (+)   Abdominal (+) + obese,   Peds  Hematology  (+) Blood dyscrasia, anemia ,   Anesthesia Other Findings   Reproductive/Obstetrics                            Anesthesia Physical  Anesthesia Plan  ASA: II  Anesthesia Plan: General   Post-op Pain Management:    Induction: Intravenous  PONV Risk Score and Plan: 4 or greater and Ondansetron, Dexamethasone, Midazolam, Scopolamine patch - Pre-op and Treatment may vary due to age or medical condition  Airway Management Planned: Oral ETT  Additional Equipment: None  Intra-op Plan:   Post-operative Plan: Extubation in OR  Informed Consent: I have reviewed the patients History and Physical, chart, labs and discussed the procedure including the risks, benefits and alternatives for the proposed anesthesia with the patient or authorized representative who has indicated his/her understanding and acceptance.     Dental advisory given  Plan Discussed with: CRNA  Anesthesia Plan Comments:        Anesthesia Quick Evaluation

## 2019-09-04 NOTE — Transfer of Care (Signed)
Immediate Anesthesia Transfer of Care Note  Patient: Maria Frank  Procedure(s) Performed: Procedure(s) (LRB): LAPAROSCOPIC TUBAL LIGATION BY CAUTERIZATION (Bilateral)  Patient Location: PACU  Anesthesia Type: General  Level of Consciousness: awake, oriented, sedated and patient cooperative  Airway & Oxygen Therapy: Patient Spontanous Breathing and Patient connected to face mask oxygen  Post-op Assessment: Report given to PACU RN and Post -op Vital signs reviewed and stable  Post vital signs: Reviewed and stable  Complications: No apparent anesthesia complications Last Vitals:  Vitals Value Taken Time  BP 127/80 09/04/19 0949  Temp 36.7 C 09/04/19 0949  Pulse 75 09/04/19 0958  Resp 17 09/04/19 0958  SpO2 97 % 09/04/19 0958  Vitals shown include unvalidated device data.  Last Pain:  Vitals:   09/04/19 0949  TempSrc:   PainSc: 0-No pain      Patients Stated Pain Goal: 5 (34/19/62 2297)  Complications: No complications documented.

## 2019-09-04 NOTE — Anesthesia Postprocedure Evaluation (Signed)
Anesthesia Post Note  Patient: Maria Frank  Procedure(s) Performed: LAPAROSCOPIC TUBAL LIGATION BY CAUTERIZATION (Bilateral Pelvis)     Patient location during evaluation: PACU Anesthesia Type: General Level of consciousness: sedated and patient cooperative Pain management: pain level controlled Vital Signs Assessment: post-procedure vital signs reviewed and stable Respiratory status: spontaneous breathing Cardiovascular status: stable Anesthetic complications: no   No complications documented.  Last Vitals:  Vitals:   09/04/19 1030 09/04/19 1107  BP: 126/85 (!) 129/91  Pulse: 70 (!) 59  Resp: 15 14  Temp:  (!) 36.3 C  SpO2: 99% 100%    Last Pain:  Vitals:   09/04/19 1107  TempSrc:   PainSc: [place]

## 2019-09-04 NOTE — Anesthesia Procedure Notes (Signed)
Procedure Name: Intubation Date/Time: 09/04/2019 8:33 AM Performed by: Suan Halter, CRNA Pre-anesthesia Checklist: Patient identified, Emergency Drugs available, Suction available and Patient being monitored Patient Re-evaluated:Patient Re-evaluated prior to induction Oxygen Delivery Method: Circle system utilized Preoxygenation: Pre-oxygenation with 100% oxygen Induction Type: IV induction Ventilation: Mask ventilation without difficulty Laryngoscope Size: Mac and 3 Grade View: Grade I Tube type: Oral Tube size: 7.0 mm Number of attempts: 1 Airway Equipment and Method: Stylet and Oral airway Placement Confirmation: ETT inserted through vocal cords under direct vision,  positive ETCO2 and breath sounds checked- equal and bilateral Secured at: 22 cm Tube secured with: Tape Dental Injury: Teeth and Oropharynx as per pre-operative assessment

## 2019-09-04 NOTE — Op Note (Signed)
Operative Note  09/04/2019  9:41 AM  PATIENT:  Maria Frank  44 y.o. female  PRE-OPERATIVE DIAGNOSIS:  Desire for sterilization  POST-OPERATIVE DIAGNOSIS:  Desire for sterilization  PROCEDURE:  Procedure(s): LAPAROSCOPIC BILATERAL TUBAL STERILIZATION BY CAUTERIZATION  SURGEON:  Surgeon(s): [name], MD  ANESTHESIA:   general  FINDINGS: Uterus, bilateral tubes and ovaries normal.    DESCRIPTION OF OPERATION: Under general anesthesia with endotracheal intubation the patient is in lithotomy position.  She is prepped with Betadine at the abdomen, suprapubic, vulvar and vaginal areas.  She is draped as usual.  Timeout is done.  The bladder is catheterized, but no urine is obtained.  The vaginal exam reveals an anteverted uterus, normal volume, no adnexal mass. The speculum is inserted in the vagina. The Ulca cannula is put in place and the other instruments are removed. We go to the abdomen. The infraumbilical area is infiltrated with Marcaine one quarter plain. A 1.5 cm incision is made with a scalpel. The aponeurosis is grasped with cokers and opened with Mayo scissors under direct vision. The parietal peritoneum is opened bluntly with the finger. A pursestring stitch of Vicryl 0 is done on the aponeurosis. The Sheryle Hail is inserted under direct vision and pneumoperitoneum is created with CO2. The camera is inserted at that level. Inspection of the pelvic cavities reveals a normal uterus, normal tubes bilaterally and normal ovaries. No pathology is seen in the pelvis. No pathology is seen in the abdomen. The bladder is full. We therefore go back vaginally to catheterize the bladder and now good amounts of urine are removed. We used the operative coat at the infraumbilical port and insert the bipolar clamp at that level. The right tube is well identified with visualization of the fimbriated. We cauterized the tube at about 2 cm from the cornua and then distal to that and in the middle. We  include the mesosalpinx to assure that the tube was completely cauterized. We proceeded exactly the same way on the left side. Hemostasis is adequate at all levels. Pictures were taken before and after bilateral tubal sterilization with cauterization. The laparoscopic instrument is removed. The Sheryle Hail is removed and the CO2 is evacuated. The pursestring stitch is attached. We put a separate stitch of Monocryl 4-0 at the adipose tissue. We closed the skin with subcuticular suture of Monocryl 4-0. Dermabond is added.  Hemostasis is adequate. The cannula is removed from the vagina. The patient is brought to recovery room in good and stable status.  ESTIMATED BLOOD LOSS: Minimal   Intake/Output Summary (Last 24 hours) at 09/04/2019 0941 Last data filed at 09/04/2019 3329 Gross per 24 hour  Intake 1100 ml  Output 0 ml  Net 1100 ml     BLOOD ADMINISTERED:none   LOCAL MEDICATIONS USED:  MARCAINE     SPECIMEN:  Source of Specimen:  None  DISPOSITION OF SPECIMEN:  N/A  COUNTS:  YES  PLAN OF CARE: Transfer to PACU  Marie-Lyne LavoieMD9:41 AM

## 2019-09-05 ENCOUNTER — Encounter (HOSPITAL_BASED_OUTPATIENT_CLINIC_OR_DEPARTMENT_OTHER): Payer: Self-pay | Admitting: Obstetrics & Gynecology

## 2019-09-07 MED ORDER — HYDROCODONE-ACETAMINOPHEN 5-325 MG PO TABS: 1.0000 | ORAL_TABLET | Freq: Four times a day (QID) | ORAL | 0 refills | Status: DC | PRN

## 2019-09-07 NOTE — Telephone Encounter (Signed)
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 Low dosage, 12 tabs, no refill" however  She did not tell me the dose and this medication can't be called in by staff. It has to be sent in by the provider. Sharrie Rothman said Dr.Lavoie is not home to sign the Rx and Dr.Lavoie asked if you would sign and send to pharmacy for patient. Her pharmacy is correct in system [facility].   Thanks

## 2019-09-07 NOTE — Telephone Encounter (Signed)
Rx placed.

## 2019-09-10 NOTE — Assessment & Plan Note (Signed)
She reports gain of as much as 50-75 lbs in recent years, but not sleep-eating.

## 2019-09-10 NOTE — Assessment & Plan Note (Signed)
I don't get impression there is significant external basis for sleep disturbance, but we need to exclude sleep breathing disorder before working more with medication. We discussed alternatives, including CBT, which she ays she has already worked with. She understands I am not qualified to address mental health issues.  Plan- sleep study first, then consider other interventions

## 2019-09-19 ENCOUNTER — Ambulatory Visit: Payer: No Typology Code available for payment source

## 2019-09-19 ENCOUNTER — Other Ambulatory Visit: Payer: Self-pay

## 2019-09-19 DIAGNOSIS — R0683 Snoring: Secondary | ICD-10-CM | POA: Diagnosis not present

## 2019-09-19 DIAGNOSIS — G4730 Sleep apnea, unspecified: Secondary | ICD-10-CM

## 2019-09-28 DIAGNOSIS — R0683 Snoring: Secondary | ICD-10-CM

## 2019-10-03 ENCOUNTER — Telehealth: Payer: Self-pay | Admitting: Internal Medicine

## 2019-10-03 NOTE — Telephone Encounter (Signed)
Spoke with patient regarding her sleep study result's from 09/19/19. Advised patient we will call her back with the result's Dr.Young can you please advise.

## 2019-10-04 NOTE — Telephone Encounter (Signed)
Her sleep study was within normal limits. She does not have sleep apnea and her oxygen levels were normal.  She can keep the appointment scheduled for November if she would like to discuss this more, or she may choose to cancel.

## 2019-10-05 NOTE — Telephone Encounter (Signed)
Left a voicemail for patient to call our office back regarding Sleep study result's.

## 2019-10-05 NOTE — Telephone Encounter (Signed)
Patient is returning phone call. Patient phone number is [phone].

## 2019-10-05 NOTE — Telephone Encounter (Signed)
lmtcb for pt.  

## 2019-10-09 NOTE — Telephone Encounter (Signed)
Spoke with patient about sleep study results. Patient expressed understanding. States that she will keep her appointment for November. Told patient to call us and let us know if she needed anything before then. Nothing further needed at this time

## 2019-10-09 NOTE — Telephone Encounter (Signed)
lmtcb for pt.  

## 2019-10-25 NOTE — Progress Notes (Signed)
Virtual Visit via Video Note  I connected with Maria Frank on 10/25/19 at  2:30 PM EDT by a video enabled telemedicine application and verified that I am speaking with the correct person using two identifiers.   I discussed the limitations of evaluation and management by telemedicine and the availability of in person appointments. The patient expressed understanding and agreed to proceed.  Present for the visit:  Myself, Dr Billey Gosling, Maria Frank.  The patient is currently at home and I am in the office.    No referring provider.    History of Present Illness: She is here for follow up of her chronic medical conditions.   She saw the neurologist for her migraines. She is taking zonegran daily and that has helped.  She still has headaches daily.  They are usually 3-4/10 level of pain.  She is trying to work with him regarding her migraines, but feels they are not controlled and wants to try a different approach.  He is focusing on lifestyle, which she is trying to do.  She does have chronic upper back and neck pain and wonders if it is related to her large breasts.  Some of her headaches are triggered from her neck pain and she wondered if she needed to look more into that.  The neurologist is giving her steroid injections around her posterior neck since that is where many of her headaches originate, but he was a little dismissive about the large breasts being the cause.  She has had chronic neck pain and upper back pain.  She does have large breasts and assumes some of this may be related to that.  She would be interested in evaluating this further.     ROS    Social History   Socioeconomic History  . Marital status: Married    Spouse name: Not on file  . Number of children: Not on file  . Years of education: Not on file  . Highest education level: Not on file  Occupational History  . Not on file  Tobacco Use  . Smoking status: Never Smoker  . Smokeless tobacco: Never Used   Vaping Use  . Vaping Use: Never used  Substance and Sexual Activity  . Alcohol use: Not Currently    Alcohol/week: 0.0 standard drinks  . Drug use: No  . Sexual activity: Yes    Partners: Male    Birth control/protection: None, I.U.D.  Other Topics Concern  . Not on file  Social History Narrative   Married since 2004, but separated 10/2012 from spouse. Originally from [place], in [place] since 2008. 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 Difficulty of Paying Living Expenses: Not on file  Food Insecurity:   . Worried About Charity fundraiser in the Last Year: Not on file  . Ran Out of Food in the Last Year: Not on file  Transportation Needs:   . Lack of Transportation (Medical): Not on file  . Lack of Transportation (Non-Medical): Not on file  Physical Activity:   . Days of Exercise per Week: Not on file  . Minutes of Exercise per Session: Not on file  Stress:   . Feeling of Stress : Not on file  Social Connections:   . Frequency of Communication with Friends and Family: Not on file  . Frequency of Social Gatherings with Friends and Family: Not on file  . Attends Religious Services: Not  on file  . Active Member of Clubs or Organizations: Not on file  . Attends Archivist Meetings: Not on file  . Marital Status: Not on file     Observations/Objective: Appears well in NAD Breathing normally Skin appear warm and dry  Assessment and Plan:  See Problem List for Assessment and Plan of chronic medical problems.   Follow Up Instructions:    I discussed the assessment and treatment plan with the patient. The patient was provided an opportunity to ask questions and all were answered. The patient agreed with the plan and demonstrated an understanding of the instructions.   The patient was advised to call back or seek an in-person evaluation if the symptoms worsen or if the condition fails to improve as  anticipated.    Binnie Rail, MD

## 2019-10-26 ENCOUNTER — Encounter: Payer: Self-pay | Admitting: Internal Medicine

## 2019-10-26 ENCOUNTER — Telehealth (INDEPENDENT_AMBULATORY_CARE_PROVIDER_SITE_OTHER): Payer: No Typology Code available for payment source | Admitting: Internal Medicine

## 2019-10-26 DIAGNOSIS — G8929 Other chronic pain: Secondary | ICD-10-CM

## 2019-10-26 DIAGNOSIS — G44019 Episodic cluster headache, not intractable: Secondary | ICD-10-CM

## 2019-10-26 DIAGNOSIS — N62 Hypertrophy of breast: Secondary | ICD-10-CM | POA: Diagnosis not present

## 2019-10-26 DIAGNOSIS — M542 Cervicalgia: Secondary | ICD-10-CM | POA: Diagnosis not present

## 2019-10-26 NOTE — Assessment & Plan Note (Signed)
Chronic Working with neurology We will evaluate chronic neck pain to see if that further improves headaches

## 2019-10-26 NOTE — Assessment & Plan Note (Signed)
Chronic neck pain She believes some of this could be related to chronic upper back pain related to large breasts Some of this chronic neck pain is triggering her migraines Will refer to Ortho for further evaluation of her chronic neck pain to see what other treatments are possible

## 2019-10-26 NOTE — Assessment & Plan Note (Signed)
Chronic Causing chronic neck and upper back pain Would be interested in breast reduction if this would help her chronic pain and ultimately her migraines Will refer to plastic surgery for further evaluation

## 2019-10-31 ENCOUNTER — Ambulatory Visit: Payer: Self-pay

## 2019-10-31 ENCOUNTER — Encounter: Payer: Self-pay | Admitting: Orthopaedic Surgery

## 2019-10-31 ENCOUNTER — Ambulatory Visit (INDEPENDENT_AMBULATORY_CARE_PROVIDER_SITE_OTHER): Payer: No Typology Code available for payment source | Admitting: Orthopaedic Surgery

## 2019-10-31 VITALS — BP 128/87 | HR 80

## 2019-10-31 DIAGNOSIS — M25512 Pain in left shoulder: Secondary | ICD-10-CM | POA: Diagnosis not present

## 2019-10-31 DIAGNOSIS — M542 Cervicalgia: Secondary | ICD-10-CM | POA: Diagnosis not present

## 2019-10-31 DIAGNOSIS — M25511 Pain in right shoulder: Secondary | ICD-10-CM

## 2019-10-31 NOTE — Progress Notes (Signed)
Office Visit Note   Patient: Maria Frank           Date of Birth: May 06, 1975           MRN: [id number] Visit Date: 10/31/2019              Requested by: Binnie Rail, MD [place],  [place] [id number] PCP: Binnie Rail, MD   Assessment & Plan: Visit Diagnoses:  1. Left shoulder pain, unspecified chronicity   2. Right shoulder pain, unspecified chronicity   3. Neck pain     Plan: With some relief she gets with distraction we will set her up with some home cervical traction she can use intermittently.  We will check her again in a month we discussed possibly considering cervical MRI scan since she has had chronic posterior headaches but also has some that is left temporal occipital which is not likely to be related to any neck problems.  Recheck 1 month.  Follow-Up Instructions: No follow-ups on file.   Orders:  Orders Placed This Encounter  Procedures  . XR Shoulder Left  . XR Shoulder Right  . XR Cervical Spine 2 or 3 views   No orders of the defined types were placed in this encounter.     Procedures: No procedures performed   Clinical Data: No additional findings.   Subjective: Chief Complaint  Patient presents with  . Neck - Pain  . Right Shoulder - Pain  . Left Shoulder - Pain    HPI 44 year old female with chronic neck pain shoulder pain which been present for several years.  She has long history of headaches and has seen a neurologist.  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  She is taking baclofen with some improvement also zonisamide.  She denies any numbness or tingling in her hands she has some discomfort with cervical range of motion at times.  No problems back in the car no problems with bathing.  Good shoulder range of motion.  She denies any myelopathic symptoms.  Review of Systems positive for allergies chronic allergic rhinitis, cluster headaches, chronic neck pain.  All other systems are  noncontributory to HPI.   Objective: Vital Signs: BP 128/87   Pulse 80   Physical Exam Constitutional:      Appearance: She is well-developed.  HENT:     Head: Normocephalic.     Right Ear: External ear normal.     Left Ear: External ear normal.  Eyes:     Pupils: Pupils are equal, round, and reactive to light.  Neck:     Thyroid: No thyromegaly.     Trachea: No tracheal deviation.  Cardiovascular:     Rate and Rhythm: Normal rate.  Pulmonary:     Effort: Pulmonary effort is normal.  Abdominal:     Palpations: Abdomen is soft.  Skin:    General: Skin is warm and dry.  Neurological:     Mental Status: She is alert and oriented to person, place, and time.  Psychiatric:        Behavior: Behavior normal.     Ortho Exam upper extremity reflexes are 2+ and symmetrical she has tenderness superior medial border of the scapula right and left as well as over the trapezial muscles.  No tenderness over the spinous processes.  Long of the biceps is normal negative impingement right left full active passive range of motion right and left shoulder.  She just  had allergy test lateral deltoid region with numbers present.  No thenar or hypothenar atrophy.  She gets some relief with cervical distraction some increase in headache and neck pain with cervical compression.  No lower extremity clonus normal heel toe gait. Specialty Comments:  No specialty comments available.  Imaging: No results found.   PMFS History: Patient Active Problem List   Diagnosis Date Noted  . Chronic neck pain 10/26/2019  . Large breasts 10/26/2019  . Cluster headaches 02/15/2019  . Eczema 02/15/2019  . 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 Foot pain, bilateral 02/06/2018  . IBS (irritable bowel syndrome) 02/06/2018  . Obesity (BMI 30.0-34.9) 02/06/2018  . Onychomycosis 03/04/2015  . Renal cyst 03/04/2015  . Osteopenia 03/04/2015  . Insomnia 03/04/2015  . IUD (intrauterine device) in place 12/01/2012  .  Trivial mitral regurgitation 07/03/2010  . Hyperlipidemia 03/30/2010  . ALLERGIC RHINITIS 03/30/2010   Past Medical History:  Diagnosis Date  . ALLERGIC RHINITIS   . CHICKENPOX, HX OF    x 2  . DEPRESSION   . Heart murmur    small no cardiologist  . HYPERLIPIDEMIA    diet controlled  . IBS (irritable bowel syndrome)   . Iron deficiency anemia   . Migraine   . Obesity   . Recurrent cold sores   . Renal cyst   . Wears glasses    reading    Family History  Problem Relation Age of Onset  . Hypertension Father   . Cardiomyopathy Father   . Sleep apnea Father   . Cancer Mother        pancreatic, liver and renal   . Osteoporosis Mother   . Renal cancer Mother   . Hypertension Paternal Grandfather   . Arthritis Other        parent & grandparents  . Breast cancer Maternal Aunt 60  . Breast cancer Cousin 30  . Asthma Sister   . Sleep apnea Brother   . Asthma Sister   . Sleep apnea Brother   . Stomach cancer Neg Hx   . Rectal cancer Neg Hx   . Esophageal cancer Neg Hx   . Colon cancer Neg Hx     Past Surgical History:  Procedure Laterality Date  . BUNIONECTOMY Bilateral feb 2020, and nov 2020  . COLONOSCOPY  11/2016  . HYSTEROSCOPY N/A 07/13/2019   Procedure: DILATION OF CERVIX, HYSTEROSCOPY, REMOVAL OF INCARCERATED PARAGARD INTRAUTERINE DEVICE;  Surgeon: [name], MD;  Location: [facility];  Service: Gynecology;  Laterality: N/A;  . LAPAROSCOPIC TUBAL LIGATION Bilateral 09/04/2019   Procedure: LAPAROSCOPIC TUBAL LIGATION BY CAUTERIZATION;  Surgeon: [name], MD;  Location: [facility];  Service: Gynecology;  Laterality: Bilateral;  request to follow in [facility] block time- requests 30 minutes OR time  . NO PAST SURGERIES     Social History   Occupational History  . Not on file  Tobacco Use  . Smoking status: Never Smoker  . Smokeless tobacco: Never Used  Vaping Use  . Vaping Use: Never used  Substance and  Sexual Activity  . Alcohol use: Not Currently    Alcohol/week: 0.0 standard drinks  . Drug use: No  . Sexual activity: Yes    Partners: Male    Birth control/protection: None, I.U.D.

## 2019-11-27 ENCOUNTER — Ambulatory Visit: Payer: No Typology Code available for payment source | Admitting: Orthopaedic Surgery

## 2019-12-04 ENCOUNTER — Ambulatory Visit: Payer: No Typology Code available for payment source | Admitting: Orthopaedic Surgery

## 2019-12-06 ENCOUNTER — Encounter: Payer: Self-pay | Admitting: Internal Medicine

## 2019-12-06 ENCOUNTER — Other Ambulatory Visit: Payer: Self-pay

## 2019-12-06 ENCOUNTER — Ambulatory Visit (INDEPENDENT_AMBULATORY_CARE_PROVIDER_SITE_OTHER): Payer: No Typology Code available for payment source | Admitting: Internal Medicine

## 2019-12-06 DIAGNOSIS — Z23 Encounter for immunization: Secondary | ICD-10-CM | POA: Diagnosis not present

## 2019-12-06 DIAGNOSIS — F5101 Primary insomnia: Secondary | ICD-10-CM | POA: Diagnosis not present

## 2019-12-06 MED ORDER — DOXEPIN HCL 3 MG PO TABS: ORAL_TABLET | ORAL | 3 refills | Status: DC

## 2019-12-06 NOTE — Progress Notes (Signed)
09/03/19- 22 yoF never smoker for sleep evaluation courtesy of Dr Orie Rout [facility].with concern of Nightmares and Insomnia. Previous meds have included alprazolam, lorazepam, melatonin, zolpidem, antihistamines, excedrin, opiates,  Medical problem list includes Allergic Rhinitis, Chronic Migraine, Depression, Hypercholesterolemia, Irritable Bowel  She complains of lifelong insomnia, saying she was middle of 9 children and shared bed with sibs when Maria Frank. Bedtime 10:30-11PM, sleep latency 30-45 minutes, waking 3-5 times and sometimes unable to regain sleep. Up at 6:30 AM. Naps around 5x/ month may help a little.  Husband sleeps soundly with little report, but she is aware she talks in sleep, lucid dreams. Says sleep not restful x 20 years. Not aware of significant snoring, sleep walkoing. Sleeps better with Zonegran 200 mg in recent months.  Little caffeine. Adjustable bed is comfortable. Des cribes good sleep environment and is aware of standard sleep hygiene recs.  Has done Cognitive Behavioral Therapy, Yoga, and understands the role of stress. Had sleep study 2004 in [place]. Not told OSA. Ambien gave nightmares.  Denies ENT surgery heart or lung disease except hx murmur. 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   12/06/19- 2 yoF never smoker followed for Chronic Insomnia with Nightmares Complicated by Allergic Rhinitis, Chronic Migraine, Depression, Hypercholesterolemia, Irritable Bowel, Duane Syndrome(with cross-eye). Previous meds tried: have included alprazolam, lorazepam, melatonin, zolpidem, antihistamines, excedrin, opiates,  Adjustable bed is comfortable. Describes good sleep environment and is aware of standard sleep hygiene recs.  Has done Cognitive Behavioral Therapy, Yoga, and understands the role of stress. HST 09/19/19- AHI 1.4/ hr, desaturation to 81%, mean sat 96%, body weight 177 lbs Covid vax- 2 Phizer Flu vax- here  standard Reviewed sleep study and discussed sleep hygiene, options for insomnia    ROS-see HPI   + = positive Constitutional:    weight loss, night sweats, fevers, chills, fatigue, lassitude. HEENT:    +headaches, difficulty swallowing, tooth/dental problems, sore throat,       +sneezing, itching, ear ache, +nasal congestion, post nasal drip, snoring CV:    chest pain, orthopnea, PND, swelling in lower extremities, anasarca,                                   dizziness, palpitations Resp:   shortness of breath with exertion or at rest.                productive cough,   non-productive cough, coughing up of blood.              change in color of mucus.  wheezing.   Skin:    rash or lesions. GI:  No-   heartburn, indigestion, abdominal pain, nausea, vomiting, diarrhea,                 change in bowel habits, +loss of appetite GU: dysuria, change in color of urine, no urgency or frequency.   flank pain. MS:   joint pain, stiffness, decreased range of motion, back pain. Neuro-     nothing unusual Psych:  change in mood or affect.  depression or anxiety.   memory loss.  OBJ- Physical Exam General- Alert, Oriented, Affect-appropriate, Distress- none acute, + overweight Skin- rash-none, lesions- none, excoriation- none Lymphadenopathy- none Head- atraumatic            Eyes- Gross vision intact, PERRLA, conjunctivae and secretions clear  Ears- Hearing, canals-normal            Nose- Clear, no-Septal dev, mucus, polyps, erosion, perforation             Throat- Mallampati III , mucosa clear , drainage- none, tonsils- atrophic Neck- flexible , trachea midline, no stridor , thyroid nl, carotid no bruit Chest - symmetrical excursion , unlabored           Heart/CV- RRR , no murmur , no gallop  , no rub, nl s1 s2                           - JVD- none , edema- none, stasis changes- none, varices- none           Lung- clear to P&A, wheeze- none, cough- none , dullness-none, rub- none            Chest wall-  Abd-  Br/ Gen/ Rectal- Not done, not indicated Extrem- cyanosis- none, clubbing, none, atrophy- none, strength- nl Neuro- grossly intact to observation

## 2019-12-06 NOTE — Patient Instructions (Signed)
Order - flu vax standard  Script sent to try doxepin for sleep  Please call as needed

## 2019-12-11 ENCOUNTER — Other Ambulatory Visit: Payer: Self-pay | Admitting: Internal Medicine

## 2019-12-11 DIAGNOSIS — Z1231 Encounter for screening mammogram for malignant neoplasm of breast: Secondary | ICD-10-CM

## 2019-12-12 ENCOUNTER — Other Ambulatory Visit: Payer: Self-pay

## 2019-12-12 ENCOUNTER — Ambulatory Visit (INDEPENDENT_AMBULATORY_CARE_PROVIDER_SITE_OTHER): Payer: No Typology Code available for payment source | Admitting: Orthopaedic Surgery

## 2019-12-12 ENCOUNTER — Ambulatory Visit
Admission: RE | Admit: 2019-12-12 | Discharge: 2019-12-12 | Disposition: A | Discharge status: Home / Self Care | Payer: No Typology Code available for payment source | Source: Ambulatory Visit | Attending: Internal Medicine | Admitting: Internal Medicine

## 2019-12-12 ENCOUNTER — Encounter: Payer: Self-pay | Admitting: Orthopaedic Surgery

## 2019-12-12 VITALS — Ht 63.0 in | Wt 173.0 lb

## 2019-12-12 DIAGNOSIS — Z1231 Encounter for screening mammogram for malignant neoplasm of breast: Secondary | ICD-10-CM

## 2019-12-12 DIAGNOSIS — G8929 Other chronic pain: Secondary | ICD-10-CM | POA: Diagnosis not present

## 2019-12-12 DIAGNOSIS — M7542 Impingement syndrome of left shoulder: Secondary | ICD-10-CM | POA: Diagnosis not present

## 2019-12-12 DIAGNOSIS — M542 Cervicalgia: Secondary | ICD-10-CM | POA: Diagnosis not present

## 2019-12-12 NOTE — Progress Notes (Signed)
Office Visit Note   Patient: Maria Frank           Date of Birth: 15-Jun-1975           MRN: [id number] Visit Date: 12/12/2019              Requested by: Binnie Rail, MD [place],  [place] [address] PCP: Binnie Rail, MD   Assessment & Plan: Visit Diagnoses:  1. Chronic neck pain   2. Impingement syndrome of left shoulder     Plan: She will proceed with her therapy we can recheck her in 8 weeks.  If she develops recurrent problems with hand numbness we will consider cervical MRI scan.  She likely has some bulging of the discs with some intermittent symptoms currently doing a little bit better.  She can continue at home traction intermittently recheck 8 weeks.  Follow-Up Instructions: Return in about 8 weeks (around 02/06/2020).   Orders:  No orders of the defined types were placed in this encounter.  No orders of the defined types were placed in this encounter.     Procedures: No procedures performed   Clinical Data: No additional findings.   Subjective: Chief Complaint  Patient presents with  . Neck - Follow-up  . Right Shoulder - Follow-up  . Left Shoulder - Follow-up    HPI 44 year old female returns with ongoing problems with neck pain and left shoulder pain.  She is used home cervical traction they gave her some relief of her neck pain.  She had problems with migraines in the past and had therapy for her shoulder that had a gap in her therapy stopped due to Covid precaution and she states she recently called and is restarting therapy on her left shoulder at [facility].  She has problems getting her arm up overhead on the left.  At times she has had numbness that radiated down to the left hand but currently states this is not bothering her.  Previous x-ray showed mild narrowing at C5-6.  She denies right upper extremity symptoms other than past problems with mild shoulder impingement not currently bothering her on the right.  She denies chills or  fever.  No gait disturbance. Patient again described condition with left eye tracking problems working right but she cannot track left and has to turn her neck to the left whenever she looks left. Review of Systems use systems updated unchanged.   Objective: Vital Signs: Ht 5\' 3"  (1.6 m)   Wt 173 lb (78.5 kg)   BMI 30.65 kg/m   Physical Exam Constitutional:      Appearance: She is well-developed.  HENT:     Head: Normocephalic.     Right Ear: External ear normal.     Left Ear: External ear normal.  Eyes:     Pupils: Pupils are equal, round, and reactive to light.  Neck:     Thyroid: No thyromegaly.     Trachea: No tracheal deviation.  Cardiovascular:     Rate and Rhythm: Normal rate.  Pulmonary:     Effort: Pulmonary effort is normal.  Abdominal:     Palpations: Abdomen is soft.  Skin:    General: Skin is warm and dry.  Neurological:     Mental Status: She is alert and oriented to person, place, and time.  Psychiatric:        Behavior: Behavior normal.     Ortho Exam patient has brachial plexus tenderness on the left.  Limitation of 30 degrees abduction and flexion left shoulder mild discomfort with impingement long of the biceps is normal no subluxation of the shoulder.  Upper extremity reflexes are 2+ and symmetrical.  She has tenderness between the shoulder blades that extends down the mid scapular level.  No tenderness extends out over the ribs.  Specialty Comments:  No specialty comments available.  Imaging: No results found.   PMFS History: Patient Active Problem List   Diagnosis Date Noted  . Impingement syndrome of left shoulder 12/12/2019  . Chronic neck pain 10/26/2019  . Large breasts 10/26/2019  . Cluster headaches 02/15/2019  . Eczema 02/15/2019  . 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 Foot pain, bilateral 02/06/2018  . IBS (irritable bowel syndrome) 02/06/2018  . Obesity (BMI 30.0-34.9) 02/06/2018  . Onychomycosis 03/04/2015  . Renal cyst  03/04/2015  . Osteopenia 03/04/2015  . Insomnia 03/04/2015  . IUD (intrauterine device) in place 12/01/2012  . Trivial mitral regurgitation 07/03/2010  . Hyperlipidemia 03/30/2010  . ALLERGIC RHINITIS 03/30/2010   Past Medical History:  Diagnosis Date  . ALLERGIC RHINITIS   . CHICKENPOX, HX OF    x 2  . DEPRESSION   . Heart murmur    small no cardiologist  . HYPERLIPIDEMIA    diet controlled  . IBS (irritable bowel syndrome)   . Iron deficiency anemia   . Migraine   . Obesity   . Recurrent cold sores   . Renal cyst   . Wears glasses    reading    Family History  Problem Relation Age of Onset  . Hypertension Father   . Cardiomyopathy Father   . Sleep apnea Father   . Cancer Mother        pancreatic, liver and renal   . Osteoporosis Mother   . Renal cancer Mother   . Hypertension Paternal Grandfather   . Arthritis Other        parent & grandparents  . Breast cancer Maternal Aunt 60  . Breast cancer Cousin 30  . Asthma Sister   . Sleep apnea Brother   . Asthma Sister   . Sleep apnea Brother   . Stomach cancer Neg Hx   . Rectal cancer Neg Hx   . Esophageal cancer Neg Hx   . Colon cancer Neg Hx     Past Surgical History:  Procedure Laterality Date  . BUNIONECTOMY Bilateral feb 2020, and nov 2020  . COLONOSCOPY  11/2016  . HYSTEROSCOPY N/A 07/13/2019   Procedure: DILATION OF CERVIX, HYSTEROSCOPY, REMOVAL OF INCARCERATED PARAGARD INTRAUTERINE DEVICE;  Surgeon: [name], MD;  Location: [facility];  Service: Gynecology;  Laterality: N/A;  . LAPAROSCOPIC TUBAL LIGATION Bilateral 09/04/2019   Procedure: LAPAROSCOPIC TUBAL LIGATION BY CAUTERIZATION;  Surgeon: [name], MD;  Location: [facility];  Service: Gynecology;  Laterality: Bilateral;  request to follow in [facility] block time- requests 30 minutes OR time  . NO PAST SURGERIES     Social History   Occupational History  . Not on file  Tobacco Use  .  Smoking status: Never Smoker  . Smokeless tobacco: Never Used  Vaping Use  . Vaping Use: Never used  Substance and Sexual Activity  . Alcohol use: Not Currently    Alcohol/week: 0.0 standard drinks  . Drug use: No  . Sexual activity: Yes    Partners: Male    Birth control/protection: None, I.U.D.

## 2019-12-16 ENCOUNTER — Other Ambulatory Visit: Payer: Self-pay | Admitting: Internal Medicine

## 2019-12-20 ENCOUNTER — Ambulatory Visit (INDEPENDENT_AMBULATORY_CARE_PROVIDER_SITE_OTHER): Payer: No Typology Code available for payment source | Admitting: Psychologist

## 2019-12-20 DIAGNOSIS — F54 Psychological and behavioral factors associated with disorders or diseases classified elsewhere: Secondary | ICD-10-CM | POA: Diagnosis not present

## 2019-12-20 DIAGNOSIS — Z630 Problems in relationship with spouse or partner: Secondary | ICD-10-CM | POA: Diagnosis not present

## 2019-12-31 NOTE — Assessment & Plan Note (Signed)
Discussed options and previous experience. Plan- try doxepin

## 2020-01-03 ENCOUNTER — Encounter: Payer: Self-pay | Admitting: Internal Medicine

## 2020-01-03 ENCOUNTER — Telehealth (INDEPENDENT_AMBULATORY_CARE_PROVIDER_SITE_OTHER): Payer: No Typology Code available for payment source | Admitting: Internal Medicine

## 2020-01-03 ENCOUNTER — Telehealth: Payer: No Typology Code available for payment source | Admitting: Internal Medicine

## 2020-01-03 DIAGNOSIS — J111 Influenza due to unidentified influenza virus with other respiratory manifestations: Secondary | ICD-10-CM | POA: Insufficient documentation

## 2020-01-03 NOTE — Assessment & Plan Note (Signed)
Needs covid-19 testing as she has not gotten booster and husband with similar symptoms. Should quarantine until results return. Advised to start nasonex which she has at home already. No antibiotics indicated today.

## 2020-01-03 NOTE — Progress Notes (Signed)
Virtual Visit via Video Note  I connected with Maria Frank on 01/03/20 at  9:00 AM EST by a video enabled telemedicine application and verified that I am speaking with the correct person using two identifiers.  The patient and the provider were at separate locations throughout the entire encounter. Patient location: home, Provider location: work   I discussed the limitations of evaluation and management by telemedicine and the availability of in person appointments. The patient expressed understanding and agreed to proceed. The patient and the provider were the only parties present for the visit unless noted in HPI below.  History of Present Illness: The patient is a 44 y.o. female with visit for congestion, sinus pressure, headache and runny nose. Started about a week or less ago. Has been using sinus rinses which help some. She has nasonex at home but has not tried this. Has gotten 2 covid-19 vaccines but no booster. Husband with similar symptoms. Denies fevers or chills but temp running higher than her normal 99 F range. Overall it is not improving or worsening.   Observations/Objective: Appearance: normal, mild cough during visit, breathing appears normal, casual grooming, abdomen does not appear distended, throat with drainage, mental status is A and O times 3  Assessment and Plan: See problem oriented charting  Follow Up Instructions: needs covid-19 test, advised to start taking nasonex, no antibiotics indicated today  I discussed the assessment and treatment plan with the patient. The patient was provided an opportunity to ask questions and all were answered. The patient agreed with the plan and demonstrated an understanding of the instructions.   The patient was advised to call back or seek an in-person evaluation if the symptoms worsen or if the condition fails to improve as anticipated.  Hoyt Koch, MD

## 2020-01-07 ENCOUNTER — Telehealth: Payer: Self-pay | Admitting: Internal Medicine

## 2020-01-07 NOTE — Telephone Encounter (Signed)
Patient called and said her COVID 19 test was negative and her symptoms are worsening. She saw Dr. Sharlet Salina on 12.2.2021

## 2020-01-08 NOTE — Telephone Encounter (Signed)
Did she start taking nasonex and/or allergy medication otc? Is she having any breathing or SOB symptoms?

## 2020-01-08 NOTE — Telephone Encounter (Signed)
Left message for patient to call back for more details about her worsening symptoms.

## 2020-01-09 ENCOUNTER — Ambulatory Visit (INDEPENDENT_AMBULATORY_CARE_PROVIDER_SITE_OTHER): Payer: No Typology Code available for payment source | Admitting: Psychologist

## 2020-01-09 DIAGNOSIS — Z630 Problems in relationship with spouse or partner: Secondary | ICD-10-CM

## 2020-01-09 DIAGNOSIS — F54 Psychological and behavioral factors associated with disorders or diseases classified elsewhere: Secondary | ICD-10-CM | POA: Diagnosis not present

## 2020-02-06 ENCOUNTER — Ambulatory Visit: Payer: No Typology Code available for payment source | Admitting: Orthopaedic Surgery

## 2020-02-13 ENCOUNTER — Ambulatory Visit (INDEPENDENT_AMBULATORY_CARE_PROVIDER_SITE_OTHER): Payer: No Typology Code available for payment source | Admitting: Psychologist

## 2020-02-13 DIAGNOSIS — Z630 Problems in relationship with spouse or partner: Secondary | ICD-10-CM

## 2020-02-13 DIAGNOSIS — F54 Psychological and behavioral factors associated with disorders or diseases classified elsewhere: Secondary | ICD-10-CM | POA: Diagnosis not present

## 2020-02-18 NOTE — Progress Notes (Signed)
Subjective:    Patient ID: Maria Frank, female    DOB: 12-20-1975, 45 y.o.   MRN: [id number]   This visit occurred during the SARS-CoV-2 public health emergency.  Safety protocols were in place, including screening questions prior to the visit, additional usage of staff PPE, and extensive cleaning of exam room while observing appropriate contact time as indicated for disinfecting solutions.    HPI She is here for a physical exam.     Medications and allergies reviewed with patient and updated if appropriate.  Patient Active Problem List   Diagnosis Date Noted  . Influenza-like illness 01/03/2020  . Impingement syndrome of left shoulder 12/12/2019  . Chronic neck pain 10/26/2019  . Large breasts 10/26/2019  . Cluster headaches 02/15/2019  . Eczema 02/15/2019  . 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 Foot pain, bilateral 02/06/2018  . IBS (irritable bowel syndrome) 02/06/2018  . Obesity (BMI 30.0-34.9) 02/06/2018  . Onychomycosis 03/04/2015  . Renal cyst 03/04/2015  . Osteopenia 03/04/2015  . Insomnia 03/04/2015  . IUD (intrauterine device) in place 12/01/2012  . Trivial mitral regurgitation 07/03/2010  . Hyperlipidemia 03/30/2010  . ALLERGIC RHINITIS 03/30/2010    Current Outpatient Medications on File Prior to Visit  Medication Sig Dispense Refill  . valACYclovir (VALTREX) 1000 MG tablet TAKE 2 TABLETS BY MOUTH EVERY 12 HOURS FOR 1 DAY AS NEEDED FOR OUTBREAK 60 tablet 1  . baclofen (LIORESAL) 10 MG tablet 1/2 tab once a week    . Doxepin HCl 3 MG TABS 1-4 tabs for sleep as needed 40 tablet 3  . EPIPEN 2-PAK 0.3 MG/0.3ML SOAJ injection Inject 0.3 mLs (0.3 mg total) into the skin once as needed. 1 Device   . metoCLOPramide (REGLAN) 10 MG tablet Take 5 mg by mouth as needed. Pt 2.5 tab daily    . zonisamide (ZONEGRAN) 100 MG capsule Take 200 mg by mouth every evening.      No current facility-administered medications on file prior to visit.    Past Medical History:   Diagnosis Date  . ALLERGIC RHINITIS   . CHICKENPOX, HX OF    x 2  . DEPRESSION   . Heart murmur    small no cardiologist  . HYPERLIPIDEMIA    diet controlled  . IBS (irritable bowel syndrome)   . Iron deficiency anemia   . Migraine   . Obesity   . Recurrent cold sores   . Renal cyst   . Wears glasses    reading    Past Surgical History:  Procedure Laterality Date  . BUNIONECTOMY Bilateral feb 2020, and nov 2020  . COLONOSCOPY  11/2016  . HYSTEROSCOPY N/A 07/13/2019   Procedure: DILATION OF CERVIX, HYSTEROSCOPY, REMOVAL OF INCARCERATED PARAGARD INTRAUTERINE DEVICE;  Surgeon: [name], MD;  Location: [facility];  Service: Gynecology;  Laterality: N/A;  . LAPAROSCOPIC TUBAL LIGATION Bilateral 09/04/2019   Procedure: LAPAROSCOPIC TUBAL LIGATION BY CAUTERIZATION;  Surgeon: [name], MD;  Location: [facility];  Service: Gynecology;  Laterality: Bilateral;  request to follow in [facility] block time- requests 30 minutes OR time  . NO PAST SURGERIES      Social History   Socioeconomic History  . Marital status: Married    Spouse name: Not on file  . Number of children: Not on file  . Years of education: Not on file  . Highest education level: Not on file  Occupational History  . Not on file  Tobacco Use  .  Smoking status: Never Smoker  . Smokeless tobacco: Never Used  Vaping Use  . Vaping Use: Never used  Substance and Sexual Activity  . Alcohol use: Not Currently    Alcohol/week: 0.0 standard drinks  . Drug use: No  . Sexual activity: Yes    Partners: Male    Birth control/protection: None, I.U.D.  Other Topics Concern  . Not on file  Social History Narrative   Married since 2004, but separated 10/2012 from spouse. Originally from [place], in [place] since 2008. 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 Hypertension Father   . Cardiomyopathy Father   . Sleep apnea Father   . Cancer Mother        pancreatic, liver and renal   . Osteoporosis Mother   . Renal cancer Mother   . Hypertension Paternal Grandfather   . Arthritis Other        parent & grandparents  . Breast cancer Maternal Aunt 60  . Breast cancer Cousin 30  . Asthma Sister   . Sleep apnea Brother   . Asthma Sister   . Sleep apnea Brother   . Stomach cancer Neg Hx   . Rectal cancer Neg Hx   . Esophageal cancer Neg Hx   . Colon cancer Neg Hx     Review of Systems     Objective:  There were no vitals filed for this visit. There were no vitals filed for this visit. There is no height or weight on file to calculate BMI.  BP Readings from Last 3 Encounters:  12/06/19 120/70  10/31/19 128/87  09/04/19 (!) 129/91    Wt Readings from Last 3 Encounters:  12/12/19 173 lb (78.5 kg)  12/06/19 173 lb 9.6 oz (78.7 kg)  09/04/19 176 lb 14.4 oz (80.2 kg)     Physical Exam Constitutional: She appears well-developed and well-nourished. No distress.  HENT:  Head: Normocephalic and atraumatic.  Right Ear: External ear normal. Normal ear canal and TM Left Ear: External ear normal.  Normal ear canal and TM Mouth/Throat: Oropharynx is clear and moist.  Eyes: Conjunctivae and EOM are normal.  Neck: Neck supple. No tracheal deviation present. No thyromegaly present.  No carotid bruit  Cardiovascular: Normal rate, regular rhythm and normal heart sounds.   No murmur heard.  No edema. Pulmonary/Chest: Effort normal and breath sounds normal. No respiratory distress. She has no wheezes. She has no rales.  Breast: deferred   Abdominal: Soft. She exhibits no distension. There is no tenderness.  Lymphadenopathy: She has no cervical adenopathy.  Skin:  Skin is warm and dry. She is not diaphoretic.  Psychiatric: She has a normal mood and affect. Her behavior is normal.        Assessment & Plan:   Physical exam: Screening blood work    ordered Immunizations  Up to date  Colonoscopy  Up to date  Mammogram  Up to date  Gyn   Exercise   Weight   Substance abuse  none      See Problem List for Assessment and Plan of chronic medical problems.      This encounter was created in error - please disregard.

## 2020-02-18 NOTE — Patient Instructions (Addendum)
Blood work was ordered.     Medications changes include :     Your prescription(s) have been submitted to your pharmacy. Please take as directed and contact our office if you believe you are having problem(s) with the medication(s).   A referral was ordered for        Someone from their office will call you to schedule an appointment.    Please followup in 1 year    Health Maintenance, Female Adopting a healthy lifestyle and getting preventive care are important in promoting health and wellness. Ask your health care provider about:  The right schedule for you to have regular tests and exams.  Things you can do on your own to prevent diseases and keep yourself healthy. What should I know about diet, weight, and exercise? Eat a healthy diet  Eat a diet that includes plenty of vegetables, fruits, low-fat dairy products, and lean protein.  Do not eat a lot of foods that are high in solid fats, added sugars, or sodium.   Maintain a healthy weight Body mass index (BMI) is used to identify weight problems. It estimates body fat based on height and weight. Your health care provider can help determine your BMI and help you achieve or maintain a healthy weight. Get regular exercise Get regular exercise. This is one of the most important things you can do for your health. Most adults should:  Exercise for at least 150 minutes each week. The exercise should increase your heart rate and make you sweat (moderate-intensity exercise).  Do strengthening exercises at least twice a week. This is in addition to the moderate-intensity exercise.  Spend less time sitting. Even light physical activity can be beneficial. Watch cholesterol and blood lipids Have your blood tested for lipids and cholesterol at 46 years of age, then have this test every 5 years. Have your cholesterol levels checked more often if:  Your lipid or cholesterol levels are high.  You are older than 46 years of age.  You  are at high risk for heart disease. What should I know about cancer screening? Depending on your health history and family history, you may need to have cancer screening at various ages. This may include screening for:  Breast cancer.  Cervical cancer.  Colorectal cancer.  Skin cancer.  Lung cancer. What should I know about heart disease, diabetes, and high blood pressure? Blood pressure and heart disease  High blood pressure causes heart disease and increases the risk of stroke. This is more likely to develop in people who have high blood pressure readings, are of African descent, or are overweight.  Have your blood pressure checked: ? Every 3-5 years if you are 18-39 years of age. ? Every year if you are 40 years old or older. Diabetes Have regular diabetes screenings. This checks your fasting blood sugar level. Have the screening done:  Once every three years after age 40 if you are at a normal weight and have a low risk for diabetes.  More often and at a younger age if you are overweight or have a high risk for diabetes. What should I know about preventing infection? Hepatitis B If you have a higher risk for hepatitis B, you should be screened for this virus. Talk with your health care provider to find out if you are at risk for hepatitis B infection. Hepatitis C Testing is recommended for:  Everyone born from 1945 through 1965.  Anyone with known risk factors for hepatitis C. Sexually   transmitted infections (STIs)  Get screened for STIs, including gonorrhea and chlamydia, if: ? You are sexually active and are younger than 46 years of age. ? You are older than 46 years of age and your health care provider tells you that you are at risk for this type of infection. ? Your sexual activity has changed since you were last screened, and you are at increased risk for chlamydia or gonorrhea. Ask your health care provider if you are at risk.  Ask your health care provider about  whether you are at high risk for HIV. Your health care provider may recommend a prescription medicine to help prevent HIV infection. If you choose to take medicine to prevent HIV, you should first get tested for HIV. You should then be tested every 3 months for as long as you are taking the medicine. Pregnancy  If you are about to stop having your period (premenopausal) and you may become pregnant, seek counseling before you get pregnant.  Take 400 to 800 micrograms (mcg) of folic acid every day if you become pregnant.  Ask for birth control (contraception) if you want to prevent pregnancy. Osteoporosis and menopause Osteoporosis is a disease in which the bones lose minerals and strength with aging. This can result in bone fractures. If you are 65 years old or older, or if you are at risk for osteoporosis and fractures, ask your health care provider if you should:  Be screened for bone loss.  Take a calcium or vitamin D supplement to lower your risk of fractures.  Be given hormone replacement therapy (HRT) to treat symptoms of menopause. Follow these instructions at home: Lifestyle  Do not use any products that contain nicotine or tobacco, such as cigarettes, e-cigarettes, and chewing tobacco. If you need help quitting, ask your health care provider.  Do not use street drugs.  Do not share needles.  Ask your health care provider for help if you need support or information about quitting drugs. Alcohol use  Do not drink alcohol if: ? Your health care provider tells you not to drink. ? You are pregnant, may be pregnant, or are planning to become pregnant.  If you drink alcohol: ? Limit how much you use to 0-1 drink a day. ? Limit intake if you are breastfeeding.  Be aware of how much alcohol is in your drink. In the [place] one drink equals one 12 oz bottle of beer (355 mL), one 5 oz glass of wine (148 mL), or one 1 oz glass of hard liquor (44 mL). General instructions  Schedule  regular health, dental, and eye exams.  Stay current with your vaccines.  Tell your health care provider if: ? You often feel depressed. ? You have ever been abused or do not feel safe at home. Summary  Adopting a healthy lifestyle and getting preventive care are important in promoting health and wellness.  Follow your health care provider's instructions about healthy diet, exercising, and getting tested or screened for diseases.  Follow your health care provider's instructions on monitoring your cholesterol and blood pressure. This information is not intended to replace advice given to you by your health care provider. Make sure you discuss any questions you have with your health care provider. Document Revised: 01/11/2018 Document Reviewed: 01/11/2018 Elsevier Patient Education  2021 [employer]  

## 2020-02-19 ENCOUNTER — Encounter: Payer: Self-pay | Admitting: Plastic Surgery

## 2020-02-19 ENCOUNTER — Ambulatory Visit (INDEPENDENT_AMBULATORY_CARE_PROVIDER_SITE_OTHER): Payer: No Typology Code available for payment source | Admitting: Plastic Surgery

## 2020-02-19 ENCOUNTER — Other Ambulatory Visit: Payer: Self-pay

## 2020-02-19 VITALS — BP 141/85 | HR 80 | Ht 63.0 in | Wt 179.6 lb

## 2020-02-19 DIAGNOSIS — M546 Pain in thoracic spine: Secondary | ICD-10-CM

## 2020-02-19 DIAGNOSIS — G44019 Episodic cluster headache, not intractable: Secondary | ICD-10-CM | POA: Diagnosis not present

## 2020-02-19 DIAGNOSIS — M7542 Impingement syndrome of left shoulder: Secondary | ICD-10-CM

## 2020-02-19 DIAGNOSIS — G8929 Other chronic pain: Secondary | ICD-10-CM

## 2020-02-19 DIAGNOSIS — N62 Hypertrophy of breast: Secondary | ICD-10-CM

## 2020-02-20 ENCOUNTER — Encounter: Payer: 59 | Admitting: Internal Medicine

## 2020-02-20 DIAGNOSIS — Z0000 Encounter for general adult medical examination without abnormal findings: Secondary | ICD-10-CM

## 2020-02-22 ENCOUNTER — Encounter: Payer: Self-pay | Admitting: Plastic Surgery

## 2020-02-22 NOTE — Progress Notes (Addendum)
Patient ID: Maria Frank, female    DOB: December 22, 1975, 45 y.o.   MRN: [id number]   Chief Complaint  Patient presents with  . Advice Only  . Breast Problem    Mammary Hyperplasia: The patient is a 45 y.o. female with a history of mammary hyperplasia for several years.  She has extremely large breasts causing symptoms that include the following: Back pain in the upper and lower back, including neck pain. She pulls or pins her bra straps to provide better lift and relief of the pressure and pain. She notices relief by holding her breast up manually.  Her shoulder straps cause grooves and pain and pressure that requires padding for relief. Pain medication is sometimes required with motrin and tylenol.  Activities that are hindered by enlarged breasts include: exercise and running.  She has tried supportive clothing as well as fitted bras without improvement.  Her breasts are extremely large with the left larger and longer than the right.  She has hyperpigmentation of the inframammary area on both sides.  The sternal to nipple distance on the right is 30 cm and the left is 32 cm.  The IMF distance is 17 cm.  She is 5 feet 3 inches tall and weighs 179 pounds.  Preoperative bra size = 36 I (G) cup. She would like to be a C/D cup.  The estimated excess breast tissue to be removed at the time of surgery = 700 grams on the left and 750 grams on the right.  Mammogram history: 12/21 and was negative.  Family history of breast cancer:  Maternal aunt.  Tobacco use:  No and does not have Diabetes. She had PT in 2021 without improvement in her symptoms. Her past medical history includes migrains, IBS and hyperlipidemia and depression.  Her past surgical history includes tubal ligation and foot surgery.     Review of Systems  Constitutional: Positive for activity change. Negative for appetite change.  HENT: Negative.   Eyes: Negative.   Respiratory: Negative for chest tightness and shortness of breath.    Cardiovascular: Negative for leg swelling.  Gastrointestinal: Negative for abdominal distention and abdominal pain.  Endocrine: Negative.   Genitourinary: Negative.   Musculoskeletal: Positive for back pain and neck pain.  Neurological: Negative.   Hematological: Negative.   Psychiatric/Behavioral: Negative.     Past Medical History:  Diagnosis Date  . ALLERGIC RHINITIS   . CHICKENPOX, HX OF    x 2  . DEPRESSION   . Heart murmur    small no cardiologist  . HYPERLIPIDEMIA    diet controlled  . IBS (irritable bowel syndrome)   . Iron deficiency anemia   . Migraine   . Obesity   . Recurrent cold sores   . Renal cyst   . Wears glasses    reading    Past Surgical History:  Procedure Laterality Date  . BUNIONECTOMY Bilateral feb 2020, and nov 2020  . COLONOSCOPY  11/2016  . HYSTEROSCOPY N/A 07/13/2019   Procedure: DILATION OF CERVIX, HYSTEROSCOPY, REMOVAL OF INCARCERATED PARAGARD INTRAUTERINE DEVICE;  Surgeon: [name], MD;  Location: [place];  Service: Gynecology;  Laterality: N/A;  . LAPAROSCOPIC TUBAL LIGATION Bilateral 09/04/2019   Procedure: LAPAROSCOPIC TUBAL LIGATION BY CAUTERIZATION;  Surgeon: [name], MD;  Location: [place];  Service: Gynecology;  Laterality: Bilateral;  request to follow in [facility] block time- requests 30 minutes OR time  . NO PAST SURGERIES  Current Outpatient Medications:  .  baclofen (LIORESAL) 10 MG tablet, 1/2 tab once a week, Disp: , Rfl:  .  Doxepin HCl 3 MG TABS, 1-4 tabs for sleep as needed, Disp: 40 tablet, Rfl: 3 .  EPIPEN 2-PAK 0.3 MG/0.3ML SOAJ injection, Inject 0.3 mLs (0.3 mg total) into the skin once as needed., Disp: 1 Device, Rfl:  .  metoCLOPramide (REGLAN) 10 MG tablet, Take 5 mg by mouth as needed. Pt 2.5 tab daily, Disp: , Rfl:  .  valACYclovir (VALTREX) 1000 MG tablet, TAKE 2 TABLETS BY MOUTH EVERY 12 HOURS FOR 1 DAY AS NEEDED FOR OUTBREAK, Disp: 60 tablet,  Rfl: 1 .  zonisamide (ZONEGRAN) 100 MG capsule, Take 200 mg by mouth every evening. , Disp: , Rfl:    Objective:   Vitals:   02/19/20 1512  BP: (!) 141/85  Pulse: 80  SpO2: 100%    Physical Exam Vitals and nursing note reviewed.  Constitutional:      Appearance: Normal appearance.  HENT:     Head: Normocephalic and atraumatic.  Eyes:     Extraocular Movements: Extraocular movements intact.  Cardiovascular:     Rate and Rhythm: Normal rate.     Pulses: Normal pulses.  Pulmonary:     Effort: Pulmonary effort is normal. No respiratory distress.     Breath sounds: No wheezing.  Abdominal:     General: Abdomen is flat. There is no distension.  Skin:    General: Skin is warm.  Neurological:     General: No focal deficit present.     Mental Status: She is alert and oriented to person, place, and time.  Psychiatric:        Mood and Affect: Mood normal.        Behavior: Behavior normal.     Assessment & Plan:  Episodic cluster headache, not intractable  Impingement syndrome of left shoulder  Symptomatic mammary hypertrophy  Chronic bilateral thoracic back pain  The patient is a good candidate for bilateral breast reduction with possible liposuction.  We will get the records from her MM and PT sent to [place].    Pictures were obtained of the patient and placed in the chart with the patient's or guardian's permission.   [place], DO

## 2020-02-26 NOTE — Patient Instructions (Addendum)
Blood work was ordered.     Hepatitis A immunization administered today.     Medications changes include :   Increase Doxepin to 10 mg   Your prescription(s) have been submitted to your pharmacy. Please take as directed and contact our office if you believe you are having problem(s) with the medication(s).   A referral was ordered for Dr Jaynee Eagles at [facility].     Someone from their office will call you to schedule an appointment.    Please followup in 1 year   Health Maintenance, Female Adopting a healthy lifestyle and getting preventive care are important in promoting health and wellness. Ask your health care provider about:  The right schedule for you to have regular tests and exams.  Things you can do on your own to prevent diseases and keep yourself healthy. What should I know about diet, weight, and exercise? Eat a healthy diet  Eat a diet that includes plenty of vegetables, fruits, low-fat dairy products, and lean protein.  Do not eat a lot of foods that are high in solid fats, added sugars, or sodium.   Maintain a healthy weight Body mass index (BMI) is used to identify weight problems. It estimates body fat based on height and weight. Your health care provider can help determine your BMI and help you achieve or maintain a healthy weight. Get regular exercise Get regular exercise. This is one of the most important things you can do for your health. Most adults should:  Exercise for at least 150 minutes each week. The exercise should increase your heart rate and make you sweat (moderate-intensity exercise).  Do strengthening exercises at least twice a week. This is in addition to the moderate-intensity exercise.  Spend less time sitting. Even light physical activity can be beneficial. Watch cholesterol and blood lipids Have your blood tested for lipids and cholesterol at 46 years of age, then have this test every 5 years. Have your cholesterol levels checked  more often if:  Your lipid or cholesterol levels are high.  You are older than 46 years of age.  You are at high risk for heart disease. What should I know about cancer screening? Depending on your health history and family history, you may need to have cancer screening at various ages. This may include screening for:  Breast cancer.  Cervical cancer.  Colorectal cancer.  Skin cancer.  Lung cancer. What should I know about heart disease, diabetes, and high blood pressure? Blood pressure and heart disease  High blood pressure causes heart disease and increases the risk of stroke. This is more likely to develop in people who have high blood pressure readings, are of African descent, or are overweight.  Have your blood pressure checked: ? Every 3-5 years if you are 67-81 years of age. ? Every year if you are 8 years old or older. Diabetes Have regular diabetes screenings. This checks your fasting blood sugar level. Have the screening done:  Once every three years after age 81 if you are at a normal weight and have a low risk for diabetes.  More often and at a younger age if you are overweight or have a high risk for diabetes. What should I know about preventing infection? Hepatitis B If you have a higher risk for hepatitis B, you should be screened for this virus. Talk with your health care provider to find out if you are at risk for hepatitis B infection. Hepatitis C Testing is recommended for:  Everyone  born from [birth year] through 1966.  Anyone with known risk factors for hepatitis C. Sexually transmitted infections (STIs)  Get screened for STIs, including gonorrhea and chlamydia, if: ? You are sexually active and are younger than 46 years of age. ? You are older than 46 years of age and your health care provider tells you that you are at risk for this type of infection. ? Your sexual activity has changed since you were last screened, and you are at increased risk for  chlamydia or gonorrhea. Ask your health care provider if you are at risk.  Ask your health care provider about whether you are at high risk for HIV. Your health care provider may recommend a prescription medicine to help prevent HIV infection. If you choose to take medicine to prevent HIV, you should first get tested for HIV. You should then be tested every 3 months for as long as you are taking the medicine. Pregnancy  If you are about to stop having your period (premenopausal) and you may become pregnant, seek counseling before you get pregnant.  Take 400 to 800 micrograms (mcg) of folic acid every day if you become pregnant.  Ask for birth control (contraception) if you want to prevent pregnancy. Osteoporosis and menopause Osteoporosis is a disease in which the bones lose minerals and strength with aging. This can result in bone fractures. If you are 69 years old or older, or if you are at risk for osteoporosis and fractures, ask your health care provider if you should:  Be screened for bone loss.  Take a calcium or vitamin D supplement to lower your risk of fractures.  Be given hormone replacement therapy (HRT) to treat symptoms of menopause. Follow these instructions at home: Lifestyle  Do not use any products that contain nicotine or tobacco, such as cigarettes, e-cigarettes, and chewing tobacco. If you need help quitting, ask your health care provider.  Do not use street drugs.  Do not share needles.  Ask your health care provider for help if you need support or information about quitting drugs. Alcohol use  Do not drink alcohol if: ? Your health care provider tells you not to drink. ? You are pregnant, may be pregnant, or are planning to become pregnant.  If you drink alcohol: ? Limit how much you use to 0-1 drink a day. ? Limit intake if you are breastfeeding.  Be aware of how much alcohol is in your drink. In the [place] one drink equals one 12 oz bottle of beer (355  mL), one 5 oz glass of wine (148 mL), or one 1 oz glass of hard liquor (44 mL). General instructions  Schedule regular health, dental, and eye exams.  Stay current with your vaccines.  Tell your health care provider if: ? You often feel depressed. ? You have ever been abused or do not feel safe at home. Summary  Adopting a healthy lifestyle and getting preventive care are important in promoting health and wellness.  Follow your health care provider's instructions about healthy diet, exercising, and getting tested or screened for diseases.  Follow your health care provider's instructions on monitoring your cholesterol and blood pressure. This information is not intended to replace advice given to you by your health care provider. Make sure you discuss any questions you have with your health care provider. Document Revised: 01/11/2018 Document Reviewed: 01/11/2018 Elsevier Patient Education  2021 [employer].

## 2020-02-26 NOTE — Progress Notes (Unsigned)
Subjective:    Patient ID: Maria Frank, female    DOB: [birth year], 45 y.o.   MRN: [id number]   This visit occurred during the SARS-CoV-2 public health emergency.  Safety protocols were in place, including screening questions prior to the visit, additional usage of staff PPE, and extensive cleaning of exam room while observing appropriate contact time as indicated for disinfecting solutions.    HPI She is here for a physical exam.   She has had a few hot flashes. She has not had irregular periods x 3 years.    Medications and allergies reviewed with patient and updated if appropriate.  Patient Active Problem List   Diagnosis Date Noted  . Influenza-like illness 01/03/2020  . Impingement syndrome of left shoulder 12/12/2019  . Chronic neck pain 10/26/2019  . Symptomatic mammary hypertrophy 10/26/2019  . Cluster headaches 02/15/2019  . Eczema 02/15/2019  . 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 IBS (irritable bowel syndrome) 02/06/2018  . Obesity (BMI 30.0-34.9) 02/06/2018  . Back pain 07/01/2015  . Onychomycosis 03/04/2015  . Renal cyst 03/04/2015  . Osteopenia 03/04/2015  . Insomnia 03/04/2015  . Trivial mitral regurgitation 07/03/2010  . Hyperlipidemia 03/30/2010  . ALLERGIC RHINITIS 03/30/2010    Current Outpatient Medications on File Prior to Visit  Medication Sig Dispense Refill  . baclofen (LIORESAL) 10 MG tablet 1/2 tab once a week    . EPIPEN 2-PAK 0.3 MG/0.3ML SOAJ injection Inject 0.3 mLs (0.3 mg total) into the skin once as needed. 1 Device   . metoCLOPramide (REGLAN) 10 MG tablet Take 5 mg by mouth as needed. Pt 2.5 tab daily    . valACYclovir (VALTREX) 1000 MG tablet TAKE 2 TABLETS BY MOUTH EVERY 12 HOURS FOR 1 DAY AS NEEDED FOR OUTBREAK 60 tablet 1  . zonisamide (ZONEGRAN) 100 MG capsule Take 200 mg by mouth every evening.     . mometasone (NASONEX) 50 MCG/ACT nasal spray 2 sprays in each nostril    . triamcinolone lotion (KENALOG) 0.1 % APPLY TO AFFECTED  AREA TWICE A DAY     No current facility-administered medications on file prior to visit.    Past Medical History:  Diagnosis Date  . ALLERGIC RHINITIS   . CHICKENPOX, HX OF    x 2  . DEPRESSION   . Heart murmur    small no cardiologist  . HYPERLIPIDEMIA    diet controlled  . IBS (irritable bowel syndrome)   . Iron deficiency anemia   . Migraine   . Obesity   . Recurrent cold sores   . Renal cyst   . Wears glasses    reading    Past Surgical History:  Procedure Laterality Date  . BUNIONECTOMY Bilateral feb 2020, and nov 2020  . COLONOSCOPY  11/2016  . HYSTEROSCOPY N/A 07/13/2019   Procedure: DILATION OF CERVIX, HYSTEROSCOPY, REMOVAL OF INCARCERATED PARAGARD INTRAUTERINE DEVICE;  Surgeon: [name], MD;  Location: [place];  Service: Gynecology;  Laterality: N/A;  . LAPAROSCOPIC TUBAL LIGATION Bilateral 09/04/2019   Procedure: LAPAROSCOPIC TUBAL LIGATION BY CAUTERIZATION;  Surgeon: [name], MD;  Location: [place];  Service: Gynecology;  Laterality: Bilateral;  request to follow in [facility] block time- requests 30 minutes OR time  . NO PAST SURGERIES      Social History   Socioeconomic History  . Marital status: Married    Spouse name: Not on file  . Number of children: Not on file  . Years of education:  Not on file  . Highest education level: Not on file  Occupational History  . Not on file  Tobacco Use  . Smoking status: Never Smoker  . Smokeless tobacco: Never Used  Vaping Use  . Vaping Use: Never used  Substance and Sexual Activity  . Alcohol use: Not Currently    Alcohol/week: 0.0 standard drinks  . Drug use: No  . Sexual activity: Yes    Partners: Male    Birth control/protection: None, I.U.D.  Other Topics Concern  . Not on file  Social History Narrative   Married since 2004, but separated 10/2012 from spouse. Originally from [place], in [place] since 2008. 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 Hypertension Father   . Cardiomyopathy Father   . Sleep apnea Father   . Cancer Mother        pancreatic, liver and renal   . Osteoporosis Mother   . Renal cancer Mother   . Hypertension Paternal Grandfather   . Arthritis Other        parent & grandparents  . Breast cancer Maternal Aunt 60  . Breast cancer Cousin 30  . Asthma Sister   . Sleep apnea Brother   . Asthma Sister   . Sleep apnea Brother   . Stomach cancer Neg Hx   . Rectal cancer Neg Hx   . Esophageal cancer Neg Hx   . Colon cancer Neg Hx     Review of Systems  Constitutional: Negative for chills and fever.  Eyes: Negative for visual disturbance.  Respiratory: Negative for cough, shortness of breath and wheezing.   Cardiovascular: Negative for chest pain, palpitations and leg swelling.  Gastrointestinal: Negative for abdominal pain (if sticks to fodmap), blood in stool, constipation, diarrhea and nausea.       Occ gerd  Genitourinary: Negative for dysuria.  Musculoskeletal: Positive for neck pain. Negative for arthralgias and back pain.  Skin: Negative for color change and rash.  Neurological: Positive for headaches (daily). Negative for dizziness and light-headedness.  Psychiatric/Behavioral: Positive for dysphoric mood. The patient is nervous/anxious.        Mild depression/anxiety - seeing a therapist.  Doing ok        Objective:   Vitals:   02/27/20 0804  BP: 132/72  Pulse: 91  Temp: 98 F (36.7 C)  SpO2: 97%   Filed Weights   02/27/20 0804  Weight: 176 lb 9.6 oz (80.1 kg)   Body mass index is 31.28 kg/m.  BP Readings from Last 3 Encounters:  02/27/20 132/72  02/19/20 (!) 141/85  12/06/19 120/70    Wt  Readings from Last 3 Encounters:  02/27/20 176 lb 9.6 oz (80.1 kg)  02/19/20 179 lb 9.6 oz (81.5 kg)  12/12/19 173 lb (78.5 kg)     Physical Exam Constitutional: She appears well-developed and well-nourished. No distress.  HENT:  Head: Normocephalic and atraumatic.  Right Ear: External ear normal. Normal ear canal and TM Left Ear: External ear normal.  Normal ear canal and TM Mouth/Throat: Oropharynx is clear and moist.  Eyes: Conjunctivae and EOM are normal.  Neck: Neck supple. No tracheal deviation present. No thyromegaly present.  No carotid  bruit  Cardiovascular: Normal rate, regular rhythm and normal heart sounds.   No murmur heard.  No edema. Pulmonary/Chest: Effort normal and breath sounds normal. No respiratory distress. She has no wheezes. She has no rales.  Breast: deferred   Abdominal: Soft. She exhibits no distension. There is no tenderness.  Lymphadenopathy: She has no cervical adenopathy.  Skin: Skin is warm and dry. She is not diaphoretic.  Psychiatric: She has a normal mood and affect. Her behavior is normal.        Assessment & Plan:   Physical exam: Screening blood work    ordered Immunizations  had covid booster;  Would like Hep A vaccine Colonoscopy  Up to date  Mammogram  Up to date  Gyn  Up to date  Exercise  Low impact - anticipating breast reduction  Weight  Obese - advised weight loss Substance abuse  none      See Problem List for Assessment and Plan of chronic medical problems.

## 2020-02-27 ENCOUNTER — Ambulatory Visit (INDEPENDENT_AMBULATORY_CARE_PROVIDER_SITE_OTHER): Payer: No Typology Code available for payment source | Admitting: Internal Medicine

## 2020-02-27 ENCOUNTER — Other Ambulatory Visit: Payer: Self-pay

## 2020-02-27 ENCOUNTER — Encounter: Payer: Self-pay | Admitting: Internal Medicine

## 2020-02-27 VITALS — BP 132/72 | HR 91 | Temp 98.0°F | Ht 63.0 in | Wt 176.6 lb

## 2020-02-27 DIAGNOSIS — Z0000 Encounter for general adult medical examination without abnormal findings: Secondary | ICD-10-CM | POA: Diagnosis not present

## 2020-02-27 DIAGNOSIS — B001 Herpesviral vesicular dermatitis: Secondary | ICD-10-CM | POA: Diagnosis not present

## 2020-02-27 DIAGNOSIS — E669 Obesity, unspecified: Secondary | ICD-10-CM | POA: Diagnosis not present

## 2020-02-27 DIAGNOSIS — G44019 Episodic cluster headache, not intractable: Secondary | ICD-10-CM

## 2020-02-27 DIAGNOSIS — Z23 Encounter for immunization: Secondary | ICD-10-CM

## 2020-02-27 DIAGNOSIS — E7849 Other hyperlipidemia: Secondary | ICD-10-CM | POA: Diagnosis not present

## 2020-02-27 DIAGNOSIS — F5101 Primary insomnia: Secondary | ICD-10-CM

## 2020-02-27 DIAGNOSIS — Z1159 Encounter for screening for other viral diseases: Secondary | ICD-10-CM

## 2020-02-27 LAB — CBC WITH DIFFERENTIAL/PLATELET
Basophils Absolute: 0 10*3/uL (ref 0.0–0.1)
Basophils Relative: 0.3 % (ref 0.0–3.0)
Eosinophils Absolute: 0 10*3/uL (ref 0.0–0.7)
Eosinophils Relative: 0.7 % (ref 0.0–5.0)
HCT: 39.5 % (ref 36.0–46.0)
Hemoglobin: 13.6 g/dL (ref 12.0–15.0)
Lymphocytes Relative: 31.7 % (ref 12.0–46.0)
Lymphs Abs: 2.1 10*3/uL (ref 0.7–4.0)
MCHC: 34.6 g/dL (ref 30.0–36.0)
MCV: 91.7 fl (ref 78.0–100.0)
Monocytes Absolute: 0.5 10*3/uL (ref 0.1–1.0)
Monocytes Relative: 8 % (ref 3.0–12.0)
Neutro Abs: 3.9 10*3/uL (ref 1.4–7.7)
Neutrophils Relative %: 59.3 % (ref 43.0–77.0)
Platelets: 196 10*3/uL (ref 150.0–400.0)
RBC: 4.3 Mil/uL (ref 3.87–5.11)
RDW: 12.5 % (ref 11.5–15.5)
WBC: 6.5 10*3/uL (ref 4.0–10.5)

## 2020-02-27 LAB — COMPREHENSIVE METABOLIC PANEL
ALT: 18 U/L (ref 0–35)
AST: 20 U/L (ref 0–37)
Albumin: 4.3 g/dL (ref 3.5–5.2)
Alkaline Phosphatase: 54 U/L (ref 39–117)
BUN: 11 mg/dL (ref 6–23)
CO2: 25 mEq/L (ref 19–32)
Calcium: 9.7 mg/dL (ref 8.4–10.5)
Chloride: 107 mEq/L (ref 96–112)
Creatinine, Ser: 0.81 mg/dL (ref 0.40–1.20)
GFR: 87.4 mL/min (ref >60.00)
Glucose, Bld: 88 mg/dL (ref 70–99)
Potassium: 3.9 mEq/L (ref 3.5–5.1)
Sodium: 140 mEq/L (ref 135–145)
Total Bilirubin: 0.5 mg/dL (ref 0.2–1.2)
Total Protein: 7.5 g/dL (ref 6.0–8.3)

## 2020-02-27 LAB — LIPID PANEL
Cholesterol: 216 mg/dL — ABNORMAL HIGH (ref 0–200)
HDL: 69.2 mg/dL (ref >39.00)
LDL Cholesterol: 131 mg/dL — ABNORMAL HIGH (ref 0–99)
NonHDL: 147
Total CHOL/HDL Ratio: 3
Triglycerides: 80 mg/dL (ref 0.0–149.0)
VLDL: 16 mg/dL (ref 0.0–40.0)

## 2020-02-27 LAB — TSH: TSH: 2.33 u[IU]/mL (ref 0.35–4.50)

## 2020-02-27 MED ORDER — DOXEPIN HCL 10 MG PO CAPS: 10.0000 mg | ORAL_CAPSULE | Freq: Every day | ORAL | 1 refills | Status: DC

## 2020-02-27 NOTE — Assessment & Plan Note (Signed)
Chronic Check lipid panel, cmp, tsh Lifestyle controlled Regular exercise and healthy diet encouraged

## 2020-02-27 NOTE — Assessment & Plan Note (Addendum)
Discussed importance of weight loss Stressed regular exercise - goal 30 minutes 5 days a week Doing weight watchers Discussed decreasing portions, decreasing sugars/carbs Increase veges, lean protein

## 2020-02-27 NOTE — Addendum Note (Signed)
Addended by: Raliegh Ip on: 02/27/2020 08:56 AM   Modules accepted: Orders

## 2020-02-27 NOTE — Assessment & Plan Note (Signed)
Chronic Taking doxepin 3 mg _ 3 at HS = 9 mg  Will change to 10 mg pill which will be much cheaper - will start Doxepin 10 mg cap HS

## 2020-02-27 NOTE — Addendum Note (Signed)
Addended by: Marcina Millard on: 02/27/2020 09:37 AM   Modules accepted: Orders

## 2020-02-27 NOTE — Assessment & Plan Note (Signed)
Chronic Recurrent Continue valtrex 2000 mg Q12 x 1 day for outbreaks

## 2020-02-27 NOTE — Assessment & Plan Note (Signed)
Chronic Was seeing Dr Domingo Cocking w/ minimal improvement Having daily headaches - not as bad as they used to be - not as well controlled as she would like Had seen ortho to evaluate if there was  Neck issues, has done PT Would like to see a different neurologist - will refer to Dr Jaynee Eagles

## 2020-02-28 LAB — HEPATITIS C ANTIBODY
Hepatitis C Ab: NONREACTIVE
SIGNAL TO CUT-OFF: 0.01 (ref <1.00)

## 2020-03-05 ENCOUNTER — Ambulatory Visit (INDEPENDENT_AMBULATORY_CARE_PROVIDER_SITE_OTHER): Payer: No Typology Code available for payment source | Admitting: Psychologist

## 2020-03-05 DIAGNOSIS — F54 Psychological and behavioral factors associated with disorders or diseases classified elsewhere: Secondary | ICD-10-CM

## 2020-03-05 DIAGNOSIS — Z630 Problems in relationship with spouse or partner: Secondary | ICD-10-CM | POA: Diagnosis not present

## 2020-04-02 ENCOUNTER — Ambulatory Visit (INDEPENDENT_AMBULATORY_CARE_PROVIDER_SITE_OTHER): Payer: No Typology Code available for payment source | Admitting: Psychologist

## 2020-04-02 DIAGNOSIS — Z630 Problems in relationship with spouse or partner: Secondary | ICD-10-CM

## 2020-04-02 DIAGNOSIS — F54 Psychological and behavioral factors associated with disorders or diseases classified elsewhere: Secondary | ICD-10-CM | POA: Diagnosis not present

## 2020-04-05 NOTE — Progress Notes (Signed)
HPI F never smoker followed for Chronic Insomnia with Nightmares Complicated by Allergic Rhinitis, Chronic Migraine, Depression, Hypercholesterolemia, Irritable Bowel, Duane Syndrome(with cross-eye). HST 09/19/19- AHI 1.4/ hr, desaturation to 81%, mean sat 96%, body weight 177 lbs   ========================================================================  09/03/19- 44 yoF never smoker for sleep evaluation courtesy of Dr Orie Rout [facility].with concern of Nightmares and Insomnia. Previous meds have included alprazolam, lorazepam, melatonin, zolpidem, antihistamines, excedrin, opiates,  Medical problem list includes Allergic Rhinitis, Chronic Migraine, Depression, Hypercholesterolemia, Irritable Bowel  She complains of lifelong insomnia, saying she was middle of 9 children and shared bed with sibs when [place]. Bedtime 10:30-11PM, sleep latency 30-45 minutes, waking 3-5 times and sometimes unable to regain sleep. Up at 6:30 AM. Naps around 5x/ month may help a little.  Husband sleeps soundly with little report, but she is aware she talks in sleep, lucid dreams. Says sleep not restful x 20 years. Not aware of significant snoring, sleep walkoing. Sleeps better with Zonegran 200 mg in recent months.  Little caffeine. Adjustable bed is comfortable. Des cribes good sleep environment and is aware of standard sleep hygiene recs.  Has done Cognitive Behavioral Therapy, Yoga, and understands the role of stress. Had sleep study 2004 in [place]. Not told OSA. Ambien gave nightmares.  Denies ENT surgery heart or lung disease except hx murmur. 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   12/06/19- 56 yoF never smoker followed for Chronic Insomnia with Nightmares Complicated by Allergic Rhinitis, Chronic Migraine, Depression, Hypercholesterolemia, Irritable Bowel, Duane Syndrome(with cross-eye). Previous meds tried: have included alprazolam, lorazepam, melatonin,  zolpidem, antihistamines, excedrin, opiates,  Adjustable bed is comfortable. Describes good sleep environment and is aware of standard sleep hygiene recs.  Has done Cognitive Behavioral Therapy, Yoga, and understands the role of stress. HST 09/19/19- AHI 1.4/ hr, desaturation to 81%, mean sat 96%, body weight 177 lbs Covid vax- 2 Phizer Flu vax- here standard Reviewed sleep study and discussed sleep hygiene, options for insomnia  04/07/20- 46 yoF never smoker followed for Chronic Insomnia with Nightmares Complicated by Allergic Rhinitis, Chronic Migraine, Depression, Hypercholesterolemia, Irritable Bowel, Duane Syndrome(with cross-eye). Previous meds tried: have included alprazolam, lorazepam, melatonin, zolpidem, antihistamines, excedrin, opiates,  Adjustable bed is comfortable. Describes good sleep environment and is aware of standard sleep hygiene recs.  Has done Cognitive Behavioral Therapy, Yoga, and understands the role of stress. -Baclofen, Sinequan 10, Zonegran,  Covid vaxs- Flu vax- -----Sleeping good Doxepin seems to be working well and her PCP has been refilling it. Denies new concerns or changes.Nightmares have decreased.  ROS-see HPI   + = positive Constitutional:    weight loss, night sweats, fevers, chills, fatigue, lassitude. HEENT:    +headaches, difficulty swallowing, tooth/dental problems, sore throat,       +sneezing, itching, ear ache, +nasal congestion, post nasal drip, snoring CV:    chest pain, orthopnea, PND, swelling in lower extremities, anasarca,                                   dizziness, palpitations Resp:   shortness of breath with exertion or at rest.                productive cough,   non-productive cough, coughing up of blood.              change in color of mucus.  wheezing.   Skin:  rash or lesions. GI:  No-   heartburn, indigestion, abdominal pain, nausea, vomiting, diarrhea,                 change in bowel habits, +loss of appetite GU: dysuria, change  in color of urine, no urgency or frequency.   flank pain. MS:   joint pain, stiffness, decreased range of motion, back pain. Neuro-     nothing unusual Psych:  change in mood or affect.  depression or anxiety.   memory loss.  OBJ- Physical Exam General- Alert, Oriented, Affect-appropriate, Distress- none acute, + overweight Skin- rash-none, lesions- none, excoriation- none Lymphadenopathy- none Head- atraumatic            Eyes- +strabismus            Ears- Hearing, canals-normal            Nose- Clear, no-Septal dev, mucus, polyps, erosion, perforation             Throat- Mallampati III , mucosa clear , drainage- none, tonsils- atrophic Neck- flexible , trachea midline, no stridor , thyroid nl, carotid no bruit Chest - symmetrical excursion , unlabored           Heart/CV- RRR , no murmur , no gallop  , no rub, nl s1 s2                           - JVD- none , edema- none, stasis changes- none, varices- none           Lung- clear to P&A, wheeze- none, cough- none , dullness-none, rub- none           Chest wall-  Abd-  Br/ Gen/ Rectal- Not done, not indicated Extrem- cyanosis- none, clubbing, none, atrophy- none, strength- nl Neuro- grossly intact to observation

## 2020-04-07 ENCOUNTER — Encounter: Payer: Self-pay | Admitting: Internal Medicine

## 2020-04-07 ENCOUNTER — Ambulatory Visit (INDEPENDENT_AMBULATORY_CARE_PROVIDER_SITE_OTHER): Payer: No Typology Code available for payment source | Admitting: Internal Medicine

## 2020-04-07 ENCOUNTER — Other Ambulatory Visit: Payer: Self-pay

## 2020-04-07 DIAGNOSIS — F5101 Primary insomnia: Secondary | ICD-10-CM

## 2020-04-07 DIAGNOSIS — E669 Obesity, unspecified: Secondary | ICD-10-CM | POA: Diagnosis not present

## 2020-04-07 NOTE — Patient Instructions (Signed)
I am glad you have found doxepin helpful. You can get your primary care giver to refill it. We will be happy to see you again if we can help.

## 2020-04-08 MED ORDER — ZONISAMIDE 100 MG PO CAPS: 200.0000 mg | ORAL_CAPSULE | Freq: Every evening | ORAL | 0 refills | Status: DC

## 2020-04-29 NOTE — Progress Notes (Signed)
[facility]    Provider:  Dr Jaynee Eagles Requesting Provider: Binnie Rail, MD Primary Care Provider:  Binnie Rail, MD  CC:  headaches  HPI:  Maria Frank is a 45 y.o. female here as requested by Binnie Rail, MD for intractable migraines. PMHx: Obesity, migraine, irritable bowel syndrome, hyperlipidemia, heart murmur, depression, cluster headaches, osteopenia, eczema, chronic neck pain, back pain, insomnia.  I reviewed Dr. Quay Burow notes: She is on zonisamide, baclofen, she has daily headaches, she has chronic neck pain and large breasts causing back pain in the upper and lower back and she has seen Dr. Tedra Coupe him in the past for breast reduction surgery evaluation, she was [facility] with [facility], having daily headaches, not as they used to be but not as well-controlled as she would like, also neck issues, has done PT, referred to neurology.  Headaches started with 12 years ago. It would start with vision in her left eye changes would narrow, she would get sensitive to light in sound, stabbing in the left eye, throbbing, unknown autonomic symptoms, she also has a congenital eye syndrome accompanying nausea, phonophobia, couldn't move, if she moved it was worse, throbbing, pounding, a muscle relaxer was prescribed and she felt like the bride in 16 candles (we laughed, that's hilarious), light had to be out, couldn't tolerate the flexeril, she would have a headache/episode 1-2x a year and took the muscle relaxers. Slowly progressed in frequency to 2x a month, she had menopause at mid 62s, gotten even worse, she has never slept well, headaches have evolved she still gets the stabbing pain and it may go to the right side, constant headache in the back of her head and neck, 4-5/10 daily headaches, at least 8-10 migraine days a month for the last year at least, can last 24-72 hours, unilateral and pounding/pulsating/throbbing with  photo/phonophobia, nausea, no vomiting, movement makes it worse, very sensitive to smell, can trigger. Flexeril helps but can;t tolerate. Can be positionally worse, morning, No other focal neurologic deficits, associated symptoms, inciting events or modifiable factors.  Reviewed notes, labs and imaging from outside physicians, which showed:  Tsh/cbc/cmp nml  CT Maxillofacial 2015: reviewed imaging The paranasal sinuses are clear without mucosal thickening or fluid.  The ostiomeatal complexes and sphenoethmoidal recesses are patent.  No osseous sinus wall thickening or dehiscence is identified.  Frontal sinus not well aerated.   Mild leftward deviation the nasal septum. Mastoid air cells are  clear. Orbits are unremarkable. Visualized portion of the brain is  unremarkable.   IMPRESSION:  Grossly unremarkable paranasal sinus CT.   From a thorough review of records, medications tried that can be used in migraine for headache management include: Tylenol, amitriptyline, baclofen, zonisamide, Decadron injections, doxepin, ketorolac injections, Medrol Dosepak, Reglan tablets, Zofran tabs and injections, Phenergan injections,  Review of Systems: Patient complains of symptoms per HPI as well as the following symptoms headaches, sleeping difficulties. Pertinent negatives and positives per HPI. All others negative.   Social History   Socioeconomic History  . Marital status: Married    Spouse name: Not on file  . Number of children: Not on file  . Years of education: Not on file  . Highest education level: Master's degree (e.g., MA, MS, MEng, MEd, MSW, MBA)  Occupational History  . Not on file  Tobacco Use  . Smoking status: Never Smoker  . Smokeless tobacco: Never Used  Vaping Use  . Vaping Use: Never used  Substance and Sexual Activity  . Alcohol use: Not Currently    Alcohol/week: 0.0 standard drinks  . Drug use: No  . Sexual activity: Yes    Partners: Male    Birth  control/protection: None, Surgical  Other Topics Concern  . Not on file  Social History Narrative   Married since 2004, but separated 10/2012 from spouse. Originally from [place], in [place] since 2008. 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 Hypertension Father   . Cardiomyopathy Father   . Sleep apnea Father   . Cancer Mother        pancreatic, liver and renal   . Osteoporosis Mother   . Renal cancer Mother   . Hypertension Paternal Grandfather   . Arthritis Other        parent & grandparents  . Breast cancer Maternal Aunt 60  . Breast cancer Cousin 30  . Asthma Sister   . Sleep apnea Brother   . Asthma Sister   . Sleep apnea Brother   . Stomach cancer Neg Hx   . Rectal cancer Neg Hx   . Esophageal cancer Neg Hx   . Colon cancer Neg Hx   . Migraines Neg Hx   . Headache Neg Hx     Past Medical History:  Diagnosis Date  . ALLERGIC RHINITIS   . CHICKENPOX, HX OF    x 2  . DEPRESSION   . Heart murmur    small no cardiologist  . HYPERLIPIDEMIA    diet controlled  . IBS (irritable bowel syndrome)   . Iron deficiency anemia   . Migraine   . Obesity   . Recurrent cold sores   . Renal cyst   . Wears glasses    reading    Patient Active Problem List   Diagnosis Date Noted  . Impingement syndrome of left shoulder 12/12/2019  . Chronic neck pain 10/26/2019  . Symptomatic mammary hypertrophy 10/26/2019  . Cluster headaches 02/15/2019  . Eczema 02/15/2019  . 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 IBS (irritable bowel syndrome) 02/06/2018  . Obesity (BMI  30.0-34.9) 02/06/2018  . Back pain 07/01/2015  . Onychomycosis 03/04/2015  . Renal cyst 03/04/2015  . Osteopenia 03/04/2015  . Insomnia 03/04/2015  . Trivial mitral regurgitation 07/03/2010  . Hyperlipidemia 03/30/2010  . ALLERGIC RHINITIS 03/30/2010    Past Surgical History:  Procedure Laterality Date  . BUNIONECTOMY Bilateral feb 2020, and nov 2020  . COLONOSCOPY  11/2016  . HYSTEROSCOPY N/A 07/13/2019   Procedure: DILATION OF CERVIX, HYSTEROSCOPY, REMOVAL OF INCARCERATED PARAGARD INTRAUTERINE DEVICE;  Surgeon: [name], MD;  Location: [facility];  Service: Gynecology;  Laterality: N/A;  . LAPAROSCOPIC TUBAL LIGATION Bilateral 09/04/2019   Procedure: LAPAROSCOPIC TUBAL LIGATION BY CAUTERIZATION;  Surgeon: [name], MD;  Location: [facility];  Service: Gynecology;  Laterality: Bilateral;  request to follow in [facility] block time- requests 30 minutes OR time  . NO PAST SURGERIES      Current  Outpatient Medications  Medication Sig Dispense Refill  . baclofen (LIORESAL) 10 MG tablet 1/2 tab once a week    . doxepin (SINEQUAN) 10 MG capsule Take 1 capsule (10 mg total) by mouth at bedtime. 90 capsule 1  . EPIPEN 2-PAK 0.3 MG/0.3ML SOAJ injection Inject 0.3 mLs (0.3 mg total) into the skin once as needed. 1 Device   . metoCLOPramide (REGLAN) 10 MG tablet Take 5 mg by mouth as needed. Pt 2.5 tab daily    . mometasone (NASONEX) 50 MCG/ACT nasal spray 2 sprays in each nostril    . ondansetron (ZOFRAN-ODT) 4 MG disintegrating tablet Take 1-2 tablets (4-8 mg total) by mouth every 8 (eight) hours as needed for nausea. 30 tablet 3  . propranolol ER (INDERAL LA) 60 MG 24 hr capsule Take 1 capsule (60 mg total) by mouth at bedtime. 30 capsule 6  . rizatriptan (MAXALT-MLT) 10 MG disintegrating tablet Take 1 tablet (10 mg total) by mouth as needed for migraine. May repeat in 2 hours if needed 9 tablet 11  . triamcinolone lotion (KENALOG) 0.1 %  APPLY TO AFFECTED AREA TWICE A DAY    . valACYclovir (VALTREX) 1000 MG tablet TAKE 2 TABLETS BY MOUTH EVERY 12 HOURS FOR 1 DAY AS NEEDED FOR OUTBREAK 60 tablet 1  . zonisamide (ZONEGRAN) 100 MG capsule Take 2 capsules (200 mg total) by mouth every evening. 60 capsule 0   No current facility-administered medications for this visit.    Allergies as of 04/30/2020 - Review Complete 04/30/2020  Allergen Reaction Noted  . Latex    . Oxycodone  12/13/2018    Vitals: BP (!) 135/94 (BP Location: Left Arm, Patient Position: Sitting)   Pulse 79   Ht 5\' 3"  (1.6 m)   Wt 183 lb (83 kg)   LMP 04/27/2020   BMI 32.42 kg/m  Last Weight:  Wt Readings from Last 1 Encounters:  04/30/20 183 lb (83 kg)   Last Height:   Ht Readings from Last 1 Encounters:  04/30/20 5\' 3"  (1.6 m)     Physical exam: Exam: Gen: NAD, conversant, well nourised, well groomed                     CV: RRR, no MRG. No Carotid Bruits. No peripheral edema, warm, nontender Eyes: Conjunctivae clear without exudates or hemorrhage  Neuro: Detailed Neurologic Exam  Speech:    Speech is normal; fluent and spontaneous with normal comprehension.  Cognition:    The patient is oriented to person, place, and time;     recent and remote memory intact;     language fluent;     normal attention, concentration,     fund of knowledge Cranial Nerves:    The pupils are equal, round, and reactive to light. Dysconjugate gave(chronic left eye disorder congenital, left 6th nerve palsy). Could not visualize fundi. Extraocular movements are intact in right eye. Trigeminal sensation is intact and the muscles of mastication are normal. The face is symmetric. The palate elevates in the midline. Hearing intact. Voice is normal. Shoulder shrug is normal. The tongue has normal motion without fasciculations.   Coordination:    No dysmetria or ataxia  Gait:    Normal native gait  Motor Observation:    No asymmetry, no atrophy, and no  involuntary movements noted. Tone:    Normal muscle tone.    Posture:    Posture is normal. normal erect    Strength:    Strength is  V/V in the upper and lower limbs.      Sensation: intact to LT     Reflex Exam:  DTR's:    Deep tendon reflexes in the upper and lower extremities are normal bilaterally.   Toes:    The toes are equiv bilaterally.   Clonus:    Clonus is absent.    Assessment/Plan:  Patient with chronic migraines and clusters. We had a long discussion about migraine management, acute and chronic. She also has cluster headaches.  Acutely: Rizatriptan: Please take one tablet at the onset of your headache. If it does not improve the symptoms please take one additional tablet. Do not take more then 2 tablets in 24hrs. Do not take use more then 2 to 3 times in a week. Ondansetron: Take with Rizatriptan  Preventative: Propranolol, consider all the other medications we discussed MRI brain w/wo contrast: cluster headaches can have rare association with mid brain tumors, need to check MRI of the brain due to concerning symptoms of monocular left vision loss, positional headaches, cluster headaches. Preventative: Start propranolol and consider CGRP injections, she is not interested in botox  Discussed: To prevent or relieve headaches, try the following: Cool Compress. Lie down and place a cool compress on your head.  Avoid headache triggers. If certain foods or odors seem to have triggered your migraines in the past, avoid them. A headache diary might help you identify triggers.  Include physical activity in your daily routine. Try a daily walk or other moderate aerobic exercise.  Manage stress. Find healthy ways to cope with the stressors, such as delegating tasks on your to-do list.  Practice relaxation techniques. Try deep breathing, yoga, massage and visualization.  Eat regularly. Eating regularly scheduled meals and maintaining a healthy diet might help prevent headaches.  Also, drink plenty of fluids.  Follow a regular sleep schedule. Sleep deprivation might contribute to headaches Consider biofeedback. With this mind-body technique, you learn to control certain bodily functions -- such as muscle tension, heart rate and blood pressure -- to prevent headaches or reduce headache pain.    Proceed to emergency room if you experience new or worsening symptoms or symptoms do not resolve, if you have new neurologic symptoms or if headache is severe, or for any concerning symptom.   Provided education and documentation from American headache Society toolbox including articles on: chronic migraine medication overuse headache, chronic migraines, prevention of migraines, behavioral and other nonpharmacologic treatments for headache.  Discussed: risk of stroke in women with migraine.  Orders Placed This Encounter  Procedures  . MR BRAIN W WO CONTRAST   Meds ordered this encounter  Medications  . rizatriptan (MAXALT-MLT) 10 MG disintegrating tablet    Sig: Take 1 tablet (10 mg total) by mouth as needed for migraine. May repeat in 2 hours if needed    Dispense:  9 tablet    Refill:  11  . ondansetron (ZOFRAN-ODT) 4 MG disintegrating tablet    Sig: Take 1-2 tablets (4-8 mg total) by mouth every 8 (eight) hours as needed for nausea.    Dispense:  30 tablet    Refill:  3  . propranolol ER (INDERAL LA) 60 MG 24 hr capsule    Sig: Take 1 capsule (60 mg total) by mouth at bedtime.    Dispense:  30 capsule    Refill:  6    Cc: Burns, Claudina Lick, MD,  Burns, Claudina Lick, MD  Sarina Ill, MD  [facility] [address]  [place], [place] [address]  Phone [phone] Fax [phone]

## 2020-04-30 ENCOUNTER — Telehealth: Payer: Self-pay | Admitting: Neurology

## 2020-04-30 ENCOUNTER — Encounter: Payer: Self-pay | Admitting: Neurology

## 2020-04-30 ENCOUNTER — Ambulatory Visit (INDEPENDENT_AMBULATORY_CARE_PROVIDER_SITE_OTHER): Payer: No Typology Code available for payment source | Admitting: Neurology

## 2020-04-30 VITALS — BP 135/94 | HR 79 | Ht 63.0 in | Wt 183.0 lb

## 2020-04-30 DIAGNOSIS — H5460 Unqualified visual loss, one eye, unspecified: Secondary | ICD-10-CM | POA: Diagnosis not present

## 2020-04-30 DIAGNOSIS — G44019 Episodic cluster headache, not intractable: Secondary | ICD-10-CM

## 2020-04-30 DIAGNOSIS — G43109 Migraine with aura, not intractable, without status migrainosus: Secondary | ICD-10-CM | POA: Diagnosis not present

## 2020-04-30 DIAGNOSIS — R519 Headache, unspecified: Secondary | ICD-10-CM

## 2020-04-30 DIAGNOSIS — G43709 Chronic migraine without aura, not intractable, without status migrainosus: Secondary | ICD-10-CM

## 2020-04-30 DIAGNOSIS — R51 Headache with orthostatic component, not elsewhere classified: Secondary | ICD-10-CM

## 2020-04-30 MED ORDER — PROPRANOLOL HCL ER 60 MG PO CP24: 60.0000 mg | ORAL_CAPSULE | Freq: Every day | ORAL | 6 refills | Status: DC

## 2020-04-30 MED ORDER — RIZATRIPTAN BENZOATE 10 MG PO TBDP: 10.0000 mg | ORAL_TABLET | ORAL | 11 refills | Status: DC | PRN

## 2020-04-30 MED ORDER — ONDANSETRON 4 MG PO TBDP: 4.0000 mg | ORAL_TABLET | Freq: Three times a day (TID) | ORAL | 3 refills | Status: DC | PRN

## 2020-04-30 NOTE — Progress Notes (Signed)
For headache management pt has tried Baclofen, Zonegran, Acetaminophen, Metoclopramide.

## 2020-04-30 NOTE — Patient Instructions (Addendum)
Acutely: Rizatriptan: Please take one tablet at the onset of your headache. If it does not improve the symptoms please take one additional tablet. Do not take more then 2 tablets in 24hrs. Do not take use more then 2 to 3 times in a week. Ondansetron: Take with Rizatriptan  Preventative: Propranolol, consider all the other medications we discussed MRI brain w/wo contrast  There is increased risk for stroke in women with migraine with aura and a contraindication for the combined contraceptive pill for use by women who have migraine with aura. The risk for women with migraine without aura is lower. However other risk factors like smoking are far more likely to increase stroke risk than migraine. There is a recommendation for no smoking and for the use of OCPs without estrogen such as progestogen only pills particularly for women with migraine with aura.Maria Frank People who have migraine headaches with auras may be 3 times more likely to have a stroke caused by a blood clot, compared to migraine patients who don't see auras. Women who take hormone-replacement therapy may be 30 percent more likely to suffer a clot-based stroke than women not taking medication containing estrogen. Other risk factors like smoking and high blood pressure may be  much more important.   Galcanezumab injection What is this medicine? GALCANEZUMAB (gal ka NEZ ue mab) is used to prevent migraines and treat cluster headaches. This medicine may be used for other purposes; ask your health care provider or pharmacist if you have questions. COMMON BRAND NAME(S): Emgality What should I tell my health care provider before I take this medicine? They need to know if you have any of these conditions:  an unusual or allergic reaction to galcanezumab, other medicines, foods, dyes, or preservatives  pregnant or trying to get pregnant  breast-feeding How should I use this medicine? This medicine is for injection under the skin. You will be  taught how to prepare and give this medicine. Use exactly as directed. Take your medicine at regular intervals. Do not take your medicine more often than directed. It is important that you put your used needles and syringes in a special sharps container. Do not put them in a trash can. If you do not have a sharps container, call your pharmacist or healthcare provider to get one. Talk to your pediatrician regarding the use of this medicine in children. Special care may be needed. Overdosage: If you think you have taken too much of this medicine contact a poison control center or emergency room at once. NOTE: This medicine is only for you. Do not share this medicine with others. What if I miss a dose? If you miss a dose, take it as soon as you can. If it is almost time for your next dose, take only that dose. Do not take double or extra doses. What may interact with this medicine? Interactions are not expected. This list may not describe all possible interactions. Give your health care provider a list of all the medicines, herbs, non-prescription drugs, or dietary supplements you use. Also tell them if you smoke, drink alcohol, or use illegal drugs. Some items may interact with your medicine. What should I watch for while using this medicine? Tell your doctor or healthcare professional if your symptoms do not start to get better or if they get worse. What side effects may I notice from receiving this medicine? Side effects that you should report to your doctor or health care professional as soon as possible:  allergic reactions  like skin rash, itching or hives, swelling of the face, lips, or tongue Side effects that usually do not require medical attention (report these to your doctor or health care professional if they continue or are bothersome):  pain, redness, or irritation at site where injected This list may not describe all possible side effects. Call your doctor for medical advice about side  effects. You may report side effects to FDA at 1-800-FDA-1088. Where should I keep my medicine? Keep out of the reach of children. You will be instructed on how to store this medicine. Throw away any unused medicine after the expiration date on the label. NOTE: This sheet is a summary. It may not cover all possible information. If you have questions about this medicine, talk to your doctor, pharmacist, or health care provider.  2021 Elsevier/Gold Standard (2017-07-06 12:03:23) Propranolol Extended-Release Capsules What is this medicine? PROPRANOLOL (proe PRAN oh lole) is a beta blocker. It decreases the amount of work your heart has to do and helps your heart beat regularly. It treats high blood pressure and/or prevent chest pain (also called angina). This medicine may be used for other purposes; ask your health care provider or pharmacist if you have questions. COMMON BRAND NAME(S): Inderal LA, Inderal XL, InnoPran XL What should I tell my health care provider before I take this medicine? They need to know if you have any of these conditions:  circulation problems, or blood vessel disease  diabetes  history of heart attack or heart disease, vasospastic angina  kidney disease  liver disease  lung or breathing disease, like asthma or emphysema  pheochromocytoma  slow heart rate  thyroid disease  an unusual or allergic reaction to propranolol, other beta-blockers, medicines, foods, dyes, or preservatives  pregnant or trying to get pregnant  breast-feeding How should I use this medicine? Take this drug by mouth. Take it as directed on the prescription label at the same time every day. Do not cut, crush or chew this drug. Swallow the capsules whole. You can take it with or without food. If it upsets your stomach, take it with food. Keep taking it unless your health care provider tells you to stop. Talk to your health care provider about the use of this drug in children. Special  care may be needed. Overdosage: If you think you have taken too much of this medicine contact a poison control center or emergency room at once. NOTE: This medicine is only for you. Do not share this medicine with others. What if I miss a dose? If you miss a dose, take it as soon as you can. If it is almost time for your next dose, take only that dose. Do not take double or extra doses. What may interact with this medicine? Do not take this medicine with any of the following medications:  feverfew  phenothiazines like chlorpromazine, mesoridazine, prochlorperazine, thioridazine This medicine may also interact with the following medications:  aluminum hydroxide gel  antipyrine  antiviral medicines for HIV or AIDS  barbiturates like phenobarbital  certain medicines for blood pressure, heart disease, irregular heart beat  cimetidine  ciprofloxacin  diazepam  fluconazole  haloperidol  isoniazid  medicines for cholesterol like cholestyramine or colestipol  medicines for mental depression  medicines for migraine headache like almotriptan, eletriptan, frovatriptan, naratriptan, rizatriptan, sumatriptan, zolmitriptan  NSAIDs, medicines for pain and inflammation, like ibuprofen or naproxen  phenytoin  rifampin  teniposide  theophylline  thyroid medicines  tolbutamide  warfarin  zileuton This list may  not describe all possible interactions. Give your health care provider a list of all the medicines, herbs, non-prescription drugs, or dietary supplements you use. Also tell them if you smoke, drink alcohol, or use illegal drugs. Some items may interact with your medicine. What should I watch for while using this medicine? Visit your doctor or health care professional for regular check ups. Contact your doctor right away if your symptoms worsen. Check your blood pressure and pulse rate regularly. Ask your health care professional what your blood pressure and pulse rate  should be, and when you should contact them. Do not stop taking this medicine suddenly. This could lead to serious heart-related effects. You may get drowsy or dizzy. Do not drive, use machinery, or do anything that needs mental alertness until you know how this drug affects you. Do not stand or sit up quickly, especially if you are an older patient. This reduces the risk of dizzy or fainting spells. Alcohol can make you more drowsy and dizzy. Avoid alcoholic drinks. This medicine may increase blood sugar. Ask your healthcare provider if changes in diet or medicines are needed if you have diabetes. Do not treat yourself for coughs, colds, or pain while you are taking this medicine without asking your doctor or health care professional for advice. Some ingredients may increase your blood pressure. What side effects may I notice from receiving this medicine? Side effects that you should report to your doctor or health care professional as soon as possible:  allergic reactions like skin rash, itching or hives, swelling of the face, lips, or tongue  breathing problems  cold hands or feet  difficulty sleeping, nightmares  dry peeling skin  hallucinations  muscle cramps or weakness  signs and symptoms of high blood sugar such as being more thirsty or hungry or having to urinate more than normal. You may also feel very tired or have blurry vision.  slow heart rate  swelling of the legs and ankles  vomiting Side effects that usually do not require medical attention (report to your doctor or health care professional if they continue or are bothersome):  change in sex drive or performance  diarrhea  dry sore eyes  hair loss  nausea  weak or tired This list may not describe all possible side effects. Call your doctor for medical advice about side effects. You may report side effects to FDA at 1-800-FDA-1088. Where should I keep my medicine? Keep out of the reach of children and  pets. Store at room temperature between 15 and 30 degrees C (59 and 86 degrees F). Protect from light and moisture. Keep the container tightly closed. Avoid exposure to extreme heat. Do not freeze. Throw away any unused drug after the expiration date. NOTE: This sheet is a summary. It may not cover all possible information. If you have questions about this medicine, talk to your doctor, pharmacist, or health care provider.  2021 Elsevier/Gold Standard (2018-08-28 16:23:26) Ondansetron oral dissolving tablet What is this medicine? ONDANSETRON (on DAN se tron) is used to treat nausea and vomiting caused by chemotherapy. It is also used to prevent or treat nausea and vomiting after surgery. This medicine may be used for other purposes; ask your health care provider or pharmacist if you have questions. COMMON BRAND NAME(S): Zofran ODT What should I tell my health care provider before I take this medicine? They need to know if you have any of these conditions:  heart disease  history of irregular heartbeat  liver disease  low levels of magnesium or potassium in the blood  an unusual or allergic reaction to ondansetron, granisetron, other medicines, foods, dyes, or preservatives  pregnant or trying to get pregnant  breast-feeding How should I use this medicine? These tablets are made to dissolve in the mouth. Do not try to push the tablet through the foil backing. With dry hands, peel away the foil backing and gently remove the tablet. Place the tablet in the mouth and allow it to dissolve, then swallow. While you may take these tablets with water, it is not necessary to do so. Talk to your pediatrician regarding the use of this medicine in children. Special care may be needed. Overdosage: If you think you have taken too much of this medicine contact a poison control center or emergency room at once. NOTE: This medicine is only for you. Do not share this medicine with others. What if I miss  a dose? If you miss a dose, take it as soon as you can. If it is almost time for your next dose, take only that dose. Do not take double or extra doses. What may interact with this medicine? Do not take this medicine with any of the following medications:  apomorphine  certain medicines for fungal infections like fluconazole, itraconazole, ketoconazole, posaconazole, voriconazole  cisapride  dronedarone  pimozide  thioridazine This medicine may also interact with the following medications:  carbamazepine  certain medicines for depression, anxiety, or psychotic disturbances  fentanyl  linezolid  MAOIs like Carbex, Eldepryl, Marplan, Nardil, and Parnate  methylene blue (injected into a vein)  other medicines that prolong the QT interval (cause an abnormal heart rhythm) like dofetilide, ziprasidone  phenytoin  rifampicin  tramadol This list may not describe all possible interactions. Give your health care provider a list of all the medicines, herbs, non-prescription drugs, or dietary supplements you use. Also tell them if you smoke, drink alcohol, or use illegal drugs. Some items may interact with your medicine. What should I watch for while using this medicine? Check with your doctor or health care professional as soon as you can if you have any sign of an allergic reaction. What side effects may I notice from receiving this medicine? Side effects that you should report to your doctor or health care professional as soon as possible:  allergic reactions like skin rash, itching or hives, swelling of the face, lips, or tongue  breathing problems  confusion  dizziness  fast or irregular heartbeat  feeling faint or lightheaded, falls  fever and chills  loss of balance or coordination  seizures  sweating  swelling of the hands and feet  tightness in the chest  tremors  unusually weak or tired Side effects that usually do not require medical attention  (report to your doctor or health care professional if they continue or are bothersome):  constipation or diarrhea  headache This list may not describe all possible side effects. Call your doctor for medical advice about side effects. You may report side effects to FDA at 1-800-FDA-1088. Where should I keep my medicine? Keep out of the reach of children. Store between 2 and 30 degrees C (36 and 86 degrees F). Throw away any unused medicine after the expiration date. NOTE: This sheet is a summary. It may not cover all possible information. If you have questions about this medicine, talk to your doctor, pharmacist, or health care provider.  2021 Elsevier/Gold Standard (2018-01-10 07:14:10) Rizatriptan disintegrating tablets What is this medicine? RIZATRIPTAN (rye za  TRIP tan) is used to treat migraines with or without aura. An aura is a strange feeling or visual disturbance that warns you of an attack. It is not used to prevent migraines. This medicine may be used for other purposes; ask your health care provider or pharmacist if you have questions. COMMON BRAND NAME(S): Maxalt-MLT What should I tell my health care provider before I take this medicine? They need to know if you have any of these conditions:  cigarette smoker  circulation problems in fingers and toes  diabetes  heart disease  high blood pressure  high cholesterol  history of irregular heartbeat  history of stroke  kidney disease  liver disease  stomach or intestine problems  an unusual or allergic reaction to rizatriptan, other medicines, foods, dyes, or preservatives  pregnant or trying to get pregnant  breast-feeding How should I use this medicine? Take this medicine by mouth. Follow the directions on the prescription label. Leave the tablet in the sealed blister pack until you are ready to take it. With dry hands, open the blister and gently remove the tablet. If the tablet breaks or crumbles, throw it  away and take a new tablet out of the blister pack. Place the tablet in the mouth and allow it to dissolve, and then swallow. Do not cut, crush, or chew this medicine. You do not need water to take this medicine. Do not take it more often than directed. Talk to your pediatrician regarding the use of this medicine in children. While this drug may be prescribed for children as young as 6 years for selected conditions, precautions do apply. Overdosage: If you think you have taken too much of this medicine contact a poison control center or emergency room at once. NOTE: This medicine is only for you. Do not share this medicine with others. What if I miss a dose? This does not apply. This medicine is not for regular use. What may interact with this medicine? Do not take this medicine with any of the following medicines:  certain medicines for migraine headache like almotriptan, eletriptan, frovatriptan, naratriptan, rizatriptan, sumatriptan, zolmitriptan  ergot alkaloids like dihydroergotamine, ergonovine, ergotamine, methylergonovine  MAOIs like Carbex, Eldepryl, Marplan, Nardil, and Parnate This medicine may also interact with the following medications:  certain medicines for depression, anxiety, or psychotic disorders  propranolol This list may not describe all possible interactions. Give your health care provider a list of all the medicines, herbs, non-prescription drugs, or dietary supplements you use. Also tell them if you smoke, drink alcohol, or use illegal drugs. Some items may interact with your medicine. What should I watch for while using this medicine? Visit your healthcare professional for regular checks on your progress. Tell your healthcare professional if your symptoms do not start to get better or if they get worse. You may get drowsy or dizzy. Do not drive, use machinery, or do anything that needs mental alertness until you know how this medicine affects you. Do not stand up or  sit up quickly, especially if you are an older patient. This reduces the risk of dizzy or fainting spells. Alcohol may interfere with the effect of this medicine. Your mouth may get dry. Chewing sugarless gum or sucking hard candy and drinking plenty of water may help. Contact your healthcare professional if the problem does not go away or is severe. If you take migraine medicines for 10 or more days a month, your migraines may get worse. Keep a diary of headache days and  medicine use. Contact your healthcare professional if your migraine attacks occur more frequently. What side effects may I notice from receiving this medicine? Side effects that you should report to your doctor or health care professional as soon as possible:  allergic reactions like skin rash, itching or hives, swelling of the face, lips, or tongue  chest pain or chest tightness  signs and symptoms of a dangerous change in heartbeat or heart rhythm like chest pain; dizziness; fast, irregular heartbeat; palpitations; feeling faint or lightheaded; falls; breathing problems  signs and symptoms of a stroke like changes in vision; confusion; trouble speaking or understanding; severe headaches; sudden numbness or weakness of the face, arm or leg; trouble walking; dizziness; loss of balance or coordination  signs and symptoms of serotonin syndrome like irritable; confusion; diarrhea; fast or irregular heartbeat; muscle twitching; stiff muscles; trouble walking; sweating; high fever; seizures; chills; vomiting Side effects that usually do not require medical attention (report to your doctor or health care professional if they continue or are bothersome):  diarrhea  dizziness  drowsiness  dry mouth  headache  nausea, vomiting  pain, tingling, numbness in the hands or feet  stomach pain This list may not describe all possible side effects. Call your doctor for medical advice about side effects. You may report side effects to  FDA at 1-800-FDA-1088. Where should I keep my medicine? Keep out of the reach of children. Store at room temperature between 15 and 30 degrees C (59 and 86 degrees F). Protect from light and moisture. Throw away any unused medicine after the expiration date. NOTE: This sheet is a summary. It may not cover all possible information. If you have questions about this medicine, talk to your doctor, pharmacist, or health care provider.  2021 Elsevier/Gold Standard (2017-08-02 14:58:08)

## 2020-04-30 NOTE — Telephone Encounter (Signed)
[employer] order sent to [facility]. They will obtain the auth and reach out to the patient to schedule.  

## 2020-05-01 ENCOUNTER — Other Ambulatory Visit: Payer: Self-pay | Admitting: Internal Medicine

## 2020-05-21 ENCOUNTER — Ambulatory Visit: Payer: No Typology Code available for payment source | Admitting: Psychologist

## 2020-05-26 ENCOUNTER — Other Ambulatory Visit: Payer: Self-pay

## 2020-05-26 ENCOUNTER — Ambulatory Visit
Admission: RE | Admit: 2020-05-26 | Discharge: 2020-05-26 | Disposition: A | Discharge status: Home / Self Care | Payer: No Typology Code available for payment source | Source: Ambulatory Visit | Attending: Neurology | Admitting: Neurology

## 2020-05-26 DIAGNOSIS — R51 Headache with orthostatic component, not elsewhere classified: Secondary | ICD-10-CM

## 2020-05-26 DIAGNOSIS — R519 Headache, unspecified: Secondary | ICD-10-CM

## 2020-05-26 DIAGNOSIS — H5460 Unqualified visual loss, one eye, unspecified: Secondary | ICD-10-CM

## 2020-05-26 MED ORDER — GADOBENATE DIMEGLUMINE 529 MG/ML IV SOLN
17.0000 mL | Freq: Once | INTRAVENOUS | Status: AC | PRN
  Administered 2020-05-26: 17 mL via INTRAVENOUS

## 2020-06-05 ENCOUNTER — Telehealth: Payer: Self-pay | Admitting: *Deleted

## 2020-06-05 MED ORDER — EMGALITY 120 MG/ML ~~LOC~~ SOAJ: 240.0000 mg | Freq: Once | SUBCUTANEOUS | 0 refills | Status: AC

## 2020-06-05 MED ORDER — EMGALITY 120 MG/ML ~~LOC~~ SOAJ: 120.0000 mg | SUBCUTANEOUS | 3 refills | Status: DC

## 2020-06-05 NOTE — Addendum Note (Signed)
Addended by: Gildardo Griffes on: 06/05/2020 03:16 PM   Modules accepted: Orders

## 2020-06-05 NOTE — Telephone Encounter (Signed)
I initiated a PA for [employer] on Cover My Meds (KEY: [id number]) and received this message from plan:   Your PA has been resolved, no additional PA is required. For further inquiries please contact the number on the back of the member prescription card. (Message [phone])

## 2020-06-10 ENCOUNTER — Telehealth: Payer: Self-pay | Admitting: Neurology

## 2020-06-10 NOTE — Telephone Encounter (Signed)
Pt is asking for a call to set up a demonstration time with RN for her Emgality, please call

## 2020-06-10 NOTE — Telephone Encounter (Signed)
Spoke with patient.  She was able to pickup her Emgality from the pharmacy.  Patient was scheduled for an education/injection appt tomorrow morning at 8:00 AM. Pt will bring the [facility] with her.

## 2020-06-11 ENCOUNTER — Ambulatory Visit (INDEPENDENT_AMBULATORY_CARE_PROVIDER_SITE_OTHER): Payer: No Typology Code available for payment source | Admitting: *Deleted

## 2020-06-11 DIAGNOSIS — Z719 Counseling, unspecified: Secondary | ICD-10-CM

## 2020-06-11 DIAGNOSIS — G43709 Chronic migraine without aura, not intractable, without status migrainosus: Secondary | ICD-10-CM | POA: Diagnosis not present

## 2020-06-11 DIAGNOSIS — Z7689 Persons encountering health services in other specified circumstances: Secondary | ICD-10-CM

## 2020-06-11 MED ORDER — EMGALITY 120 MG/ML ~~LOC~~ SOAJ: 240.0000 mg | Freq: Once | SUBCUTANEOUS | 0 refills | Status: AC

## 2020-06-11 NOTE — Progress Notes (Signed)
Patient here for initial Emgality injection 240 mg loading dose administration and training. Discussed care/storage of Emgality, reviewed detailed instructions page with page and discussed proper administration including hand hygiene, injection site choices, and technique of administration. Patient was given the first injection in RUQ of abdomen then she gave herself the second injection in LUQ appropriately. Patient's questions were answered. Patient is aware of the most common side effect which is injection site reaction. Patient took home the package insert with drug information and instructions. She will contact the office for any questions or concerns.

## 2020-06-18 ENCOUNTER — Other Ambulatory Visit: Payer: Self-pay | Admitting: Neurology

## 2020-07-09 ENCOUNTER — Encounter: Payer: Self-pay | Admitting: Plastic Surgery

## 2020-07-20 NOTE — Assessment & Plan Note (Signed)
Doxepin reported to have improved sleep and reduced nightmares without morning sedation. Plan- ok to continue this through her PCP. We will be happy to see again in the future if needed.

## 2020-07-20 NOTE — Assessment & Plan Note (Signed)
Recommend diet, exercise. Consider [facility].

## 2020-08-05 ENCOUNTER — Other Ambulatory Visit: Payer: Self-pay | Admitting: Neurology

## 2020-08-06 ENCOUNTER — Other Ambulatory Visit: Payer: Self-pay

## 2020-08-06 ENCOUNTER — Ambulatory Visit (INDEPENDENT_AMBULATORY_CARE_PROVIDER_SITE_OTHER): Payer: No Typology Code available for payment source | Admitting: Neurology

## 2020-08-06 ENCOUNTER — Encounter: Payer: Self-pay | Admitting: Neurology

## 2020-08-06 VITALS — BP 130/83 | HR 70 | Ht 63.0 in | Wt 182.6 lb

## 2020-08-06 DIAGNOSIS — G43709 Chronic migraine without aura, not intractable, without status migrainosus: Secondary | ICD-10-CM | POA: Diagnosis not present

## 2020-08-06 MED ORDER — ZONISAMIDE 100 MG PO CAPS: 200.0000 mg | ORAL_CAPSULE | Freq: Every day | ORAL | 3 refills | Status: DC

## 2020-08-06 MED ORDER — EMGALITY 120 MG/ML ~~LOC~~ SOAJ: 120.0000 mg | SUBCUTANEOUS | 3 refills | Status: DC

## 2020-08-06 MED ORDER — ONDANSETRON 4 MG PO TBDP: 4.0000 mg | ORAL_TABLET | Freq: Three times a day (TID) | ORAL | 11 refills | Status: DC | PRN

## 2020-08-06 MED ORDER — RIZATRIPTAN BENZOATE 10 MG PO TBDP: 10.0000 mg | ORAL_TABLET | ORAL | 11 refills | Status: DC | PRN

## 2020-08-06 NOTE — Progress Notes (Signed)
[facility]    Provider:  Dr Jaynee Eagles Requesting Provider: Binnie Rail, MD Primary Care Provider:  Binnie Rail, MD  CC:  headaches  Interval history: Patient here for follow up of migraines. Doing well on Emgality. Has significantly decreased her headaches and migraines. Her current headaches/migraines are mild and 1-2x a week, she will take maxalt and works well, She wants to stay on the Zonisamide, we discussed decreasing abd she will think about it. She feels well. She is going through menopause.   HPI:  Maria Frank is a 45 y.o. female here as requested by Binnie Rail, MD for intractable migraines. PMHx: Obesity, migraine, irritable bowel syndrome, hyperlipidemia, heart murmur, depression, cluster headaches, osteopenia, eczema, chronic neck pain, back pain, insomnia.  I reviewed Dr. Quay Burow notes: She is on zonisamide, baclofen, she has daily headaches, she has chronic neck pain and large breasts causing back pain in the upper and lower back and she has seen Dr. Tedra Coupe him in the past for breast reduction surgery evaluation, she was [facility] Domingo Cocking [facility] with minimal improvement, having daily headaches, not as they used to be but not as well-controlled as she would like, also neck issues, has done PT, referred to neurology.  Headaches started with 12 years ago. It would start with vision in her left eye changes would narrow, she would get sensitive to light in sound, stabbing in the left eye, throbbing, unknown autonomic symptoms, she also has a congenital eye syndrome accompanying nausea, phonophobia, couldn't move, if she moved it was worse, throbbing, pounding, a muscle relaxer was prescribed and she felt like the bride in 16 candles (we laughed, that's hilarious), light had to be out, couldn't tolerate the flexeril, she would have a headache/episode 1-2x a year and took the muscle relaxers. Slowly progressed in frequency to 2x a month, she  had menopause at mid 63s, gotten even worse, she has never slept well, headaches have evolved she still gets the stabbing pain and it may go to the right side, constant headache in the back of her head and neck, 4-5/10 daily headaches, at least 8-10 migraine days a month for the last year at least, can last 24-72 hours, unilateral and pounding/pulsating/throbbing with photo/phonophobia, nausea, no vomiting, movement makes it worse, very sensitive to smell, can trigger. Flexeril helps but can;t tolerate. Can be positionally worse, morning, No other focal neurologic deficits, associated symptoms, inciting events or modifiable factors.  Reviewed notes, labs and imaging from outside physicians, which showed:  Tsh/cbc/cmp nml  CT Maxillofacial 2015: reviewed imaging The paranasal sinuses are clear without mucosal thickening or fluid.  The ostiomeatal complexes and sphenoethmoidal recesses are patent.  No osseous sinus wall thickening or dehiscence is identified.  Frontal sinus not well aerated.   Mild leftward deviation the nasal septum. Mastoid air cells are  clear. Orbits are unremarkable. Visualized portion of the brain is  unremarkable.   IMPRESSION:  Grossly unremarkable paranasal sinus CT.   From a thorough review of records, medications tried that can be used in migraine for headache management include: Tylenol, amitriptyline, baclofen, zonisamide, Decadron injections, doxepin, ketorolac injections, Medrol Dosepak, Reglan tablets, Zofran tabs and injections, Phenergan injections,  Review of Systems: Patient complains of symptoms per HPI as well as the following symptoms: menopause. Pertinent negatives and positives per HPI. All others negative    Social History   Socioeconomic History   Marital status: Married    Spouse name: Not on file  Number of children: Not on file   Years of education: Not on file   Highest education level: Master's degree (e.g., MA, MS, MEng, MEd, MSW, MBA)   Occupational History   Not on file  Tobacco Use   Smoking status: Never   Smokeless tobacco: Never  Vaping Use   Vaping Use: Never used  Substance and Sexual Activity   Alcohol use: Not Currently    Alcohol/week: 0.0 standard drinks   Drug use: No   Sexual activity: Yes    Partners: Male    Birth control/protection: None, Surgical  Other Topics Concern   Not on file  Social History Narrative   Married since 2004, but separated 10/2012 from spouse. Originally from [place], in [place] since 2008. 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      Lives at home with spouse and dog   Ambidextrous, R dominant    Caffeine: tries to avoid caffeine   Social Determinants of Health   Financial Resource Strain: Not on file  Food Insecurity: Not on file  Transportation Needs: Not on file  Physical Activity: Not on file  Stress: Not on file  Social Connections: Not on file  Intimate Partner Violence: Not on file    Family History  Problem Relation Age of Onset   Hypertension Father    Cardiomyopathy Father    Sleep apnea Father    Cancer Mother        pancreatic, liver and renal    Osteoporosis Mother    Renal cancer Mother    Hypertension Paternal Grandfather    Arthritis Other        parent & grandparents   Breast cancer Maternal Aunt 20   Breast cancer Cousin 85   Asthma Sister    Sleep apnea Brother    Asthma Sister    Sleep apnea Brother    Stomach cancer Neg Hx    Rectal cancer Neg Hx    Esophageal cancer Neg Hx    Colon cancer Neg Hx    Migraines Neg Hx    Headache Neg Hx     Past Medical History:  Diagnosis Date   ALLERGIC RHINITIS    CHICKENPOX, HX OF    x 2   DEPRESSION    Heart murmur    small no cardiologist   HYPERLIPIDEMIA    diet controlled   IBS (irritable bowel syndrome)    Iron deficiency anemia    Migraine    Obesity    Recurrent cold sores    Renal cyst    Wears glasses    reading    Patient Active Problem List   Diagnosis Date Noted    Impingement syndrome of left shoulder 12/12/2019   Chronic neck pain 10/26/2019   Symptomatic mammary hypertrophy 10/26/2019   Cluster headaches 02/15/2019   Eczema 02/15/2019   Recurrent cold sores 12/19/2018   IBS (irritable bowel syndrome) 02/06/2018   Obesity (BMI 30.0-34.9) 02/06/2018   Back pain 07/01/2015   Onychomycosis 03/04/2015   Renal cyst 03/04/2015   Osteopenia 03/04/2015   Insomnia 03/04/2015   Trivial mitral regurgitation 07/03/2010   Hyperlipidemia 03/30/2010   ALLERGIC RHINITIS 03/30/2010    Past Surgical History:  Procedure Laterality Date   BUNIONECTOMY Bilateral feb 2020, and nov 2020   COLONOSCOPY  11/2016   HYSTEROSCOPY N/A 07/13/2019   Procedure: DILATION OF CERVIX, HYSTEROSCOPY, REMOVAL OF INCARCERATED PARAGARD INTRAUTERINE DEVICE;  Surgeon: [name], MD;  Location: [place];  Service: Gynecology;  Laterality: N/A;   LAPAROSCOPIC TUBAL LIGATION Bilateral 09/04/2019   Procedure: LAPAROSCOPIC TUBAL LIGATION BY CAUTERIZATION;  Surgeon: [name], MD;  Location: [place];  Service: Gynecology;  Laterality: Bilateral;  request to follow in [facility] block time- requests 30 minutes OR time   NO PAST SURGERIES      Current Outpatient Medications  Medication Sig Dispense Refill   baclofen (LIORESAL) 10 MG tablet 1/2 tab once a week     doxepin (SINEQUAN) 10 MG capsule Take 1 capsule (10 mg total) by mouth at bedtime. 90 capsule 1   EPIPEN 2-PAK 0.3 MG/0.3ML SOAJ injection Inject 0.3 mLs (0.3 mg total) into the skin once as needed. 1 Device    metoCLOPramide (REGLAN) 10 MG tablet Take 5 mg by mouth as needed. Pt 2.5 tab daily     mometasone (NASONEX) 50 MCG/ACT nasal spray 2 sprays in each nostril     triamcinolone lotion (KENALOG) 0.1 % APPLY TO AFFECTED AREA TWICE A DAY     valACYclovir (VALTREX) 1000 MG tablet TAKE 2 TABLETS BY MOUTH EVERY 12 HOURS FOR 1 DAY AS NEEDED FOR OUTBREAK 60 tablet 1    Galcanezumab-gnlm (EMGALITY) 120 MG/ML SOAJ Inject 120 mg into the skin every 30 (thirty) days. 1 mL 3   ondansetron (ZOFRAN-ODT) 4 MG disintegrating tablet Take 1-2 tablets (4-8 mg total) by mouth every 8 (eight) hours as needed for nausea. 30 tablet 11   rizatriptan (MAXALT-MLT) 10 MG disintegrating tablet Take 1 tablet (10 mg total) by mouth as needed for migraine. May repeat in 2 hours if needed 9 tablet 11   zonisamide (ZONEGRAN) 100 MG capsule Take 2 capsules (200 mg total) by mouth daily. 180 capsule 3   No current facility-administered medications for this visit.    Allergies as of 08/06/2020 - Review Complete 08/06/2020  Allergen Reaction Noted   Latex     Oxycodone  12/13/2018    Vitals: BP 130/83   Pulse 70   Ht 5\' 3"  (1.6 m)   Wt 182 lb 9.6 oz (82.8 kg)   BMI 32.35 kg/m  Last Weight:  Wt Readings from Last 1 Encounters:  08/06/20 182 lb 9.6 oz (82.8 kg)   Last Height:   Ht Readings from Last 1 Encounters:  08/06/20 5\' 3"  (1.6 m)    Exam: NAD, pleasant                  Speech:    Speech is normal; fluent and spontaneous with normal comprehension.  Cognition:    The patient is oriented to person, place, and time;     recent and remote memory intact;     language fluent;    Cranial Nerves:    The pupils are equal, round, and reactive to light. Dysconjugate gave(chronic left eye disorder congenital, left 6th nerve palsy).Trigeminal sensation is intact and the muscles of mastication are normal. The face is symmetric. The palate elevates in the midline. Hearing intact. Voice is normal. Shoulder shrug is normal. The tongue has normal motion without fasciculations.   Coordination:  No dysmetria  Motor Observation:    No asymmetry, no atrophy, and no involuntary movements noted. Tone:    Normal muscle tone.     Strength:    Strength is V/V in the upper and lower limbs.      Sensation: intact to LT      Assessment/Plan:  Patient with chronic migraines and  clusters. Doing well concurrent med, refill  Meds ordered this encounter  Medications   zonisamide (ZONEGRAN) 100 MG capsule    Sig: Take 2 capsules (200 mg total) by mouth daily.    Dispense:  180 capsule    Refill:  3   rizatriptan (MAXALT-MLT) 10 MG disintegrating tablet    Sig: Take 1 tablet (10 mg total) by mouth as needed for migraine. May repeat in 2 hours if needed    Dispense:  9 tablet    Refill:  11   Galcanezumab-gnlm (EMGALITY) 120 MG/ML SOAJ    Sig: Inject 120 mg into the skin every 30 (thirty) days.    Dispense:  1 mL    Refill:  3   ondansetron (ZOFRAN-ODT) 4 MG disintegrating tablet    Sig: Take 1-2 tablets (4-8 mg total) by mouth every 8 (eight) hours as needed for nausea.    Dispense:  30 tablet    Refill:  11     Cc: Burns, Claudina Lick, MD,  Binnie Rail, MD  Sarina Ill, MD  [facility] [address] [place], [place] [address]  Phone [phone] Fax [phone]  I spent over 10  minutes of face-to-face and non-face-to-face time with patient on the  1. Chronic migraine without aura without status migrainosus, not intractable    diagnosis.  This included previsit chart review, lab review, study review, order entry, electronic health record documentation, patient education on the different diagnostic and therapeutic options, counseling and coordination of care, risks and benefits of management, compliance, or risk factor reduction

## 2020-08-22 ENCOUNTER — Other Ambulatory Visit: Payer: Self-pay | Admitting: Urology

## 2020-08-22 DIAGNOSIS — N281 Cyst of kidney, acquired: Secondary | ICD-10-CM

## 2020-08-24 ENCOUNTER — Other Ambulatory Visit: Payer: Self-pay | Admitting: Internal Medicine

## 2020-08-27 ENCOUNTER — Other Ambulatory Visit: Payer: Self-pay

## 2020-08-27 ENCOUNTER — Ambulatory Visit (INDEPENDENT_AMBULATORY_CARE_PROVIDER_SITE_OTHER): Payer: No Typology Code available for payment source

## 2020-08-27 DIAGNOSIS — Z23 Encounter for immunization: Secondary | ICD-10-CM

## 2020-09-19 ENCOUNTER — Other Ambulatory Visit: Payer: Self-pay

## 2020-09-19 ENCOUNTER — Ambulatory Visit
Admission: RE | Admit: 2020-09-19 | Discharge: 2020-09-19 | Disposition: A | Discharge status: Home / Self Care | Payer: No Typology Code available for payment source | Source: Ambulatory Visit | Attending: Urology | Admitting: Urology

## 2020-09-19 DIAGNOSIS — N281 Cyst of kidney, acquired: Secondary | ICD-10-CM

## 2020-09-19 MED ORDER — GADOBENATE DIMEGLUMINE 529 MG/ML IV SOLN
15.0000 mL | Freq: Once | INTRAVENOUS | Status: AC | PRN
  Administered 2020-09-19: 15 mL via INTRAVENOUS

## 2020-09-29 NOTE — Progress Notes (Addendum)
Referring Provider Binnie Rail, MD [place],  [place] [id number]   CC: No chief complaint on file.     Maria Frank is an 45 y.o. female.  HPI: Patient is a 45 year old female with PMH of macromastia presented to the clinic for consult to discuss breast reduction.  She was recently denied by insurance for the breast reduction procedure.  She is presenting to discuss out-of-pocket options.  On my examination, patient states that she is uncertain as to why insurance did not approve her surgery.  She has had the same [facility] for 3+ years.  They are the ones who encouraged her to do a "hinge health" at home PT program.  She has had at least 6 weeks of physical therapy and continues to endorse daily bilateral trapezial region discomfort, likely in the setting of her bilateral macromastia.  She has been worked up by orthopedics and neurosurgery.  She continues to experience discomfort which is limiting her ability to exercise and lose weight.  She has lost approximately 6 pounds since her initial consult here in January 2022.  She does not smoke tobacco or drink alcohol.  She is currently a G cup, but is willing to lose as much breast tissue has needed to be approved through her insurance company.  She does not particularly care about the size of her breasts, but is simply ready for her symptomatic macromastia to be treated.  While she hopes that it can be approved by insurance, she is also willing to pay out-of-pocket if it comes to that.  She denies any changes in her interim health over the course of the past 6 months.   Allergies  Allergen Reactions   Latex     Rash itching   Oxycodone     Nausea, dizziness and shaking.    Outpatient Encounter Medications as of 09/30/2020  Medication Sig Note   baclofen (LIORESAL) 10 MG tablet 1/2 tab once a week    doxepin (SINEQUAN) 10 MG capsule TAKE 1 CAPSULE BY MOUTH AT BEDTIME.    EPIPEN 2-PAK 0.3 MG/0.3ML SOAJ injection  Inject 0.3 mLs (0.3 mg total) into the skin once as needed. 07/13/2019: Never used   Galcanezumab-gnlm (EMGALITY) 120 MG/ML SOAJ Inject 120 mg into the skin every 30 (thirty) days.    metoCLOPramide (REGLAN) 10 MG tablet Take 5 mg by mouth as needed. Pt 2.5 tab daily    mometasone (NASONEX) 50 MCG/ACT nasal spray 2 sprays in each nostril    ondansetron (ZOFRAN-ODT) 4 MG disintegrating tablet Take 1-2 tablets (4-8 mg total) by mouth every 8 (eight) hours as needed for nausea.    rizatriptan (MAXALT-MLT) 10 MG disintegrating tablet Take 1 tablet (10 mg total) by mouth as needed for migraine. May repeat in 2 hours if needed    triamcinolone lotion (KENALOG) 0.1 % APPLY TO AFFECTED AREA TWICE A DAY    valACYclovir (VALTREX) 1000 MG tablet TAKE 2 TABLETS BY MOUTH EVERY 12 HOURS FOR 1 DAY AS NEEDED FOR OUTBREAK    zonisamide (ZONEGRAN) 100 MG capsule Take 2 capsules (200 mg total) by mouth daily.    No facility-administered encounter medications on file as of 09/30/2020.     Past Medical History:  Diagnosis Date   ALLERGIC RHINITIS    CHICKENPOX, HX OF    x 2   DEPRESSION    Heart murmur    small no cardiologist   HYPERLIPIDEMIA    diet controlled   IBS (irritable  bowel syndrome)    Iron deficiency anemia    Migraine    Obesity    Recurrent cold sores    Renal cyst    Wears glasses    reading    Past Surgical History:  Procedure Laterality Date   BUNIONECTOMY Bilateral feb 2020, and nov 2020   COLONOSCOPY  11/2016   HYSTEROSCOPY N/A 07/13/2019   Procedure: DILATION OF CERVIX, HYSTEROSCOPY, REMOVAL OF INCARCERATED PARAGARD INTRAUTERINE DEVICE;  Surgeon: [name], MD;  Location: [place];  Service: Gynecology;  Laterality: N/A;   LAPAROSCOPIC TUBAL LIGATION Bilateral 09/04/2019   Procedure: LAPAROSCOPIC TUBAL LIGATION BY CAUTERIZATION;  Surgeon: [name], MD;  Location: [place];  Service: Gynecology;  Laterality: Bilateral;  request  to follow in [place] block time- requests 30 minutes OR time   NO PAST SURGERIES      Family History  Problem Relation Age of Onset   Hypertension Father    Cardiomyopathy Father    Sleep apnea Father    Cancer Mother        pancreatic, liver and renal    Osteoporosis Mother    Renal cancer Mother    Hypertension Paternal Grandfather    Arthritis Other        parent & grandparents   Breast cancer Maternal Aunt 22   Breast cancer Cousin 68   Asthma Sister    Sleep apnea Brother    Asthma Sister    Sleep apnea Brother    Stomach cancer Neg Hx    Rectal cancer Neg Hx    Esophageal cancer Neg Hx    Colon cancer Neg Hx    Migraines Neg Hx    Headache Neg Hx     Social History   Social History Narrative   Married since 2004, but separated 10/2012 from spouse. Originally from [place], in [place] since 2008. 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      Lives at home with spouse and dog   Ambidextrous, R dominant    Caffeine: tries to avoid caffeine     Review of Systems General: Denies fevers or chills Cardio: Denies chest pain Pulmonary: Denies difficulty breathing  Physical Exam Vitals with BMI 08/06/2020 04/30/2020 04/07/2020  Height '5\' 3"'$  '5\' 3"'$  '5\' 3"'$   Weight 182 lbs 10 oz 183 lbs 181 lbs 10 oz  BMI 32.35 [id number]  Systolic [id number]  Diastolic 83 94 76  Pulse 70 79 76    General:  No acute distress, nontoxic appearing  Respiratory: No increased work of breathing Neuro: Alert and oriented Psychiatric: Normal mood and affect  MSK: Bilateral trapezial indentations from bra straps.  Assessment/Plan  Will contact our surgical coordinator for her to help determine why her insurance claim was denied.  She continues to experience daily bilateral trapezial discomfort and intermittent episodic headaches which she attributes to her muscular discomfort.  Patient is interested in pursuing surgical intervention for bilateral breast reduction. Patient has completed at least  6 weeks of physical therapy for pain related to macromastia.  Estimated tissue to be removed is 800 g from each breast.  We will also provide her with a quote should she be denied and has to pay out-of-pocket.  She does not have any further questions or concerns at this time.    Krista Blue 09/29/2020, 7:14 AM

## 2020-09-30 ENCOUNTER — Ambulatory Visit (INDEPENDENT_AMBULATORY_CARE_PROVIDER_SITE_OTHER): Payer: No Typology Code available for payment source | Admitting: Physician Assistant

## 2020-09-30 ENCOUNTER — Other Ambulatory Visit: Payer: Self-pay

## 2020-09-30 ENCOUNTER — Encounter: Payer: Self-pay | Admitting: Physician Assistant

## 2020-09-30 VITALS — Ht 63.0 in | Wt 173.2 lb

## 2020-09-30 DIAGNOSIS — N62 Hypertrophy of breast: Secondary | ICD-10-CM

## 2020-10-16 ENCOUNTER — Ambulatory Visit: Payer: No Typology Code available for payment source | Admitting: Obstetrics & Gynecology

## 2020-11-12 ENCOUNTER — Telehealth: Payer: Self-pay

## 2020-11-12 NOTE — Telephone Encounter (Signed)
I have submitted a PA request for Emgality on [facility], Key: [id number] - PA Case ID: [id number]. Awaiting determination from [place].

## 2020-11-13 ENCOUNTER — Encounter: Payer: Self-pay | Admitting: Plastic Surgery

## 2020-11-17 NOTE — Telephone Encounter (Signed)
Pt has been approved for [employer]. The approval dates are 10/14,/2022-11/14/2021  Will contact patient through mychart to make her aware of approval.

## 2020-11-28 ENCOUNTER — Encounter: Payer: No Typology Code available for payment source | Admitting: Physician Assistant

## 2020-12-11 ENCOUNTER — Encounter (HOSPITAL_BASED_OUTPATIENT_CLINIC_OR_DEPARTMENT_OTHER): Payer: Self-pay

## 2020-12-11 ENCOUNTER — Ambulatory Visit (HOSPITAL_BASED_OUTPATIENT_CLINIC_OR_DEPARTMENT_OTHER): Admit: 2020-12-11 | Payer: No Typology Code available for payment source | Admitting: Plastic Surgery

## 2020-12-11 SURGERY — BREAST REDUCTION WITH LIPOSUCTION
Anesthesia: General | Site: Breast | Laterality: Bilateral

## 2020-12-18 ENCOUNTER — Other Ambulatory Visit: Payer: Self-pay | Admitting: Neurology

## 2020-12-18 ENCOUNTER — Other Ambulatory Visit: Payer: Self-pay | Admitting: Internal Medicine

## 2020-12-18 NOTE — Telephone Encounter (Signed)
I donot see that we have prescirbed, called pharmacy closed for lunch.

## 2020-12-19 ENCOUNTER — Encounter: Payer: No Typology Code available for payment source | Admitting: Plastic Surgery

## 2020-12-19 ENCOUNTER — Encounter: Payer: No Typology Code available for payment source | Admitting: Physician Assistant

## 2020-12-27 ENCOUNTER — Other Ambulatory Visit: Payer: Self-pay | Admitting: Internal Medicine

## 2021-01-02 ENCOUNTER — Encounter: Payer: No Typology Code available for payment source | Admitting: Physician Assistant

## 2021-01-13 ENCOUNTER — Telehealth: Payer: Self-pay | Admitting: Neurology

## 2021-01-13 MED ORDER — EMGALITY 120 MG/ML ~~LOC~~ SOAJ
120.0000 mg | SUBCUTANEOUS | 6 refills | Status: DC
Start: 2021-01-13 — End: 2021-08-11

## 2021-01-13 NOTE — Telephone Encounter (Signed)
Pt requesting refill for Galcanezumab-gnlm (EMGALITY) 120 MG/ML SOAJ. Pharmacy [id number] - [place], [place] [address]. AT [address]

## 2021-01-13 NOTE — Telephone Encounter (Signed)
Pending appt 08/11/21. Refills sent to pharmacy.

## 2021-01-15 ENCOUNTER — Other Ambulatory Visit: Payer: Self-pay | Admitting: Neurology

## 2021-01-19 NOTE — Progress Notes (Signed)
Patient ID: Maria Frank, female    DOB: [birth year], 45 y.o.   MRN: [id number]  Chief Complaint  Patient presents with   Pre-op Exam      ICD-10-CM   1. Symptomatic mammary hypertrophy  N62        History of Present Illness: Maria Frank is a 45 y.o.  female  with a history of macromastia.  She presents for preoperative evaluation for upcoming procedure, bilateral breast reduction and possible liposuction, scheduled for 02/12/2021 with Dr. Marla Roe.  The patient has not had problems with anesthesia. Patient does endorse latex sensitivity, but denies any dermatitis or skin sensitivities with latex free tape adhesives.  She reports that her last mammogram was 12/2019, negative.  She does have family history of breast cancer (aunt on mom's side).  No personal history of cancer.  No personal or family history of blood clots or clotting disorder.  She is approximately 36 G cup, but notes that her left breast is much larger than her right.  We will update photos today.  Summary of Previous Visit: Patient was seen for initial consult 02/19/2020 and then for subsequent visit after completion of physical therapy 09/30/2020.  She continues to endorse frequent headaches and trapezial region discomfort that she believes may be related to her mammary hyperplasia.  She is approximately 36G cup, between 750 and 800 g estimated excess breast tissue removed at time of surgery.  In addition to her headache disorder, she also has allergic rhinitis.  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  PMH Significant for: Macromastia, allergic rhinitis, headache disorder.   Past Medical History: Allergies: Allergies  Allergen Reactions   Latex     Rash itching   Oxycodone     Nausea, dizziness and shaking.    Current Medications:  Current Outpatient Medications:    baclofen (LIORESAL) 10 MG tablet, TAKE 1 TABLET BY MOUTH TWICE A DAY AS NEEDED *LIMIT TO 1 TO 2 TREATMENTS PER WEEK*, Disp: 20 tablet,  Rfl: 0   cephALEXin (KEFLEX) 500 MG capsule, Take 1 capsule (500 mg total) by mouth 4 (four) times daily for 3 days., Disp: 12 capsule, Rfl: 0   doxepin (SINEQUAN) 10 MG capsule, TAKE 1 CAPSULE BY MOUTH AT BEDTIME., Disp: 90 capsule, Rfl: 1   Galcanezumab-gnlm (EMGALITY) 120 MG/ML SOAJ, Inject 120 mg into the skin every 30 (thirty) days., Disp: 1 mL, Rfl: 6   HYDROcodone-acetaminophen (NORCO) 5-325 MG tablet, Take 1 tablet by mouth every 6 (six) hours as needed for up to 5 days for severe pain., Disp: 20 tablet, Rfl: 0   mometasone (NASONEX) 50 MCG/ACT nasal spray, 2 sprays in each nostril, Disp: , Rfl:    ondansetron (ZOFRAN-ODT) 4 MG disintegrating tablet, Take 1 tablet (4 mg total) by mouth every 8 (eight) hours as needed for nausea or vomiting., Disp: 20 tablet, Rfl: 0   rizatriptan (MAXALT-MLT) 10 MG disintegrating tablet, Take 1 tablet (10 mg total) by mouth as needed for migraine. May repeat in 2 hours if needed, Disp: 9 tablet, Rfl: 11   zonisamide (ZONEGRAN) 100 MG capsule, Take 2 capsules (200 mg total) by mouth daily., Disp: 180 capsule, Rfl: 3   EPIPEN 2-PAK 0.3 MG/0.3ML SOAJ injection, Inject 0.3 mLs (0.3 mg total) into the skin once as needed. (Patient not taking: Reported on 01/20/2021), Disp: 1 Device, Rfl:    metoCLOPramide (REGLAN) 10 MG tablet, Take 5 mg by mouth as needed. Pt 2.5 tab daily (Patient not taking: Reported on 01/20/2021),  Disp: , Rfl:    triamcinolone lotion (KENALOG) 0.1 %, APPLY TO AFFECTED AREA TWICE A DAY (Patient not taking: Reported on 01/20/2021), Disp: , Rfl:    valACYclovir (VALTREX) 1000 MG tablet, TAKE 2 TABLETS BY MOUTH EVERY 12 HOURS FOR 1 DAY AS NEEDED FOR OUTBREAK (Patient not taking: Reported on 01/20/2021), Disp: 60 tablet, Rfl: 1  Past Medical Problems: Past Medical History:  Diagnosis Date   ALLERGIC RHINITIS    CHICKENPOX, HX OF    x 2   DEPRESSION    Heart murmur    small no cardiologist   HYPERLIPIDEMIA    diet controlled   IBS  (irritable bowel syndrome)    Iron deficiency anemia    Migraine    Obesity    Recurrent cold sores    Renal cyst    Wears glasses    reading    Past Surgical History: Past Surgical History:  Procedure Laterality Date   BUNIONECTOMY Bilateral feb 2020, and nov 2020   COLONOSCOPY  11/2016   HYSTEROSCOPY N/A 07/13/2019   Procedure: DILATION OF CERVIX, HYSTEROSCOPY, REMOVAL OF INCARCERATED PARAGARD INTRAUTERINE DEVICE;  Surgeon: [name], MD;  Location: [place] [facility];  Service: Gynecology;  Laterality: N/A;   LAPAROSCOPIC TUBAL LIGATION Bilateral 09/04/2019   Procedure: LAPAROSCOPIC TUBAL LIGATION BY CAUTERIZATION;  Surgeon: [name], MD;  Location: [facility];  Service: Gynecology;  Laterality: Bilateral;  request to follow in [place] block time- requests 30 minutes OR time   NO PAST SURGERIES      Social History: Social History   Socioeconomic History   Marital status: Married    Spouse name: Not on file   Number of children: Not on file   Years of education: Not on file   Highest education level: Master's degree (e.g., MA, MS, MEng, MEd, MSW, MBA)  Occupational History   Not on file  Tobacco Use   Smoking status: Never   Smokeless tobacco: Never  Vaping Use   Vaping Use: Never used  Substance and Sexual Activity   Alcohol use: Not Currently    Alcohol/week: 0.0 standard drinks   Drug use: No   Sexual activity: Yes    Partners: Male    Birth control/protection: None, Surgical  Other Topics Concern   Not on file  Social History Narrative   Married since 2004, but separated 10/2012 from spouse. Originally from [place], in [place] since 2008. 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      Lives at home with spouse and dog   Ambidextrous, R dominant    Caffeine: tries to avoid caffeine   Social Determinants of Radio broadcast assistant Strain: Not on file  Food Insecurity: Not on file  Transportation Needs: Not on file   Physical Activity: Not on file  Stress: Not on file  Social Connections: Not on file  Intimate Partner Violence: Not on file    Family History: Family History  Problem Relation Age of Onset   Hypertension Father    Cardiomyopathy Father    Sleep apnea Father    Cancer Mother        pancreatic, liver and renal    Osteoporosis Mother    Renal cancer Mother    Hypertension Paternal Grandfather    Arthritis Other        parent & grandparents   Breast cancer Maternal Aunt 92   Breast cancer Cousin 46   Asthma Sister    Sleep apnea Brother  Asthma Sister    Sleep apnea Brother    Stomach cancer Neg Hx    Rectal cancer Neg Hx    Esophageal cancer Neg Hx    Colon cancer Neg Hx    Migraines Neg Hx    Headache Neg Hx     Review of Systems: ROS Denies recent illness, infection, or rash.  Physical Exam: Vital Signs BP 131/77 (BP Location: Left Arm, Patient Position: Sitting, Cuff Size: Large)    Ht 5\' 3"  (1.6 m)    Wt 179 lb 4.8 oz (81.3 kg)    SpO2 100%    BMI 31.76 kg/m   Physical Exam Constitutional:      General: Not in acute distress.    Appearance: Normal appearance. Not ill-appearing.  HENT:     Head: Normocephalic and atraumatic.  Eyes:     Pupils: Pupils are equal, round. Cardiovascular:     Rate and Rhythm: Normal rate.    Pulses: Normal pulses.  Pulmonary:     Effort: No respiratory distress or increased work of breathing.  Speaks in full sentences. Abdominal:     General: Abdomen is flat. No distension.   Musculoskeletal: Normal range of motion. No lower extremity swelling or edema. No varicosities. Skin:    General: Skin is warm and dry.     Findings: No erythema or rash.  Neurological:     Mental Status: Alert and oriented to person, place, and time.  Psychiatric:        Mood and Affect: Mood normal.        Behavior: Behavior normal.    Assessment/Plan: The patient is scheduled for bilateral breast reduction possible liposuction 02/12/2021  with Dr. Marla Roe.  Risks, benefits, and alternatives of procedure discussed, questions answered and consent obtained.    Smoking Status: Non-smoker. Last Mammogram: 12/2019; Results: BI-RADS Category 1: Negative.  Caprini Score: 34; Risk Factors include: Age, BMI greater than 25, and length of planned surgery. Recommendation for mechanical prophylaxis. Encourage early ambulation.   Pictures obtained: Updated photos today.  Post-op Rx sent to pharmacy: Norco, Zofran, Keflex.  She endorses adverse reactions to oxycodone, but has had no difficulty with hydrocodone.  Patient was provided with the General Surgical Risk consent document and Pain Medication Agreement prior to their appointment.  They had adequate time to read through the risk consent documents and Pain Medication Agreement. We also discussed them in person together during this preop appointment. All of their questions were answered to their satisfaction.  Recommended calling if they have any further questions.  Risk consent form and Pain Medication Agreement to be scanned into patient's chart.  The risk that can be encountered with breast reduction were discussed and include the following but not limited to these:  Breast asymmetry, fluid accumulation, firmness of the breast, inability to breast feed, loss of nipple or areola, skin loss, decrease or no nipple sensation, fat necrosis of the breast tissue, bleeding, infection, healing delay.  There are risks of anesthesia, changes to skin sensation and injury to nerves or blood vessels.  The muscle can be temporarily or permanently injured.  You may have an allergic reaction to tape, suture, glue, blood products which can result in skin discoloration, swelling, pain, skin lesions, poor healing.  Any of these can lead to the need for revisonal surgery or stage procedures.  A reduction has potential to interfere with diagnostic procedures.  Nipple or breast piercing can increase risks of  infection.  This procedure is best done  when the breast is fully developed.  Changes in the breast will continue to occur over time.  Pregnancy can alter the outcomes of previous breast reduction surgery, weight gain and weigh loss can also effect the long term appearance.   The risks that can be encountered with and after liposuction were discussed and include the following but no limited to these: Asymmetry, fluid accumulation, firmness of the area, fat necrosis with death of fat tissue, bleeding, infection, delayed healing, anesthesia risks, skin sensation changes, injury to structures including nerves, blood vessels, and muscles which may be temporary or permanent, allergies to tape, suture materials and glues, blood products, topical preparations or injected agents, skin and contour irregularities, skin discoloration and swelling, deep vein thrombosis, cardiac and pulmonary complications, pain, which may persist, persistent pain, recurrence of the lesion, poor healing of the incision, possible need for revisional surgery or staged procedures. Thiere can also be persistent swelling, poor wound healing, rippling or loose skin, worsening of cellulite, swelling, and thermal burn or heat injury from ultrasound with the ultrasound-assisted lipoplasty technique. Any change in weight fluctuations can alter the outcome.    Electronically signed by: Maria Blue, PA-C 01/20/2021 1:08 PM

## 2021-01-19 NOTE — H&P (View-Only) (Signed)
Patient ID: Maria Frank, female    DOB: [birth year], 45 y.o.   MRN: [id number]  Chief Complaint  Patient presents with   Pre-op Exam      ICD-10-CM   1. Symptomatic mammary hypertrophy  N62        History of Present Illness: Maria Frank is a 45 y.o.  female  with a history of macromastia.  She presents for preoperative evaluation for upcoming procedure, bilateral breast reduction and possible liposuction, scheduled for 02/12/2021 with Dr. Marla Roe.  The patient has not had problems with anesthesia. Patient does endorse latex sensitivity, but denies any dermatitis or skin sensitivities with latex free tape adhesives.  She reports that her last mammogram was 12/2019, negative.  She does have family history of breast cancer (aunt on mom's side).  No personal history of cancer.  No personal or family history of blood clots or clotting disorder.  She is approximately 36 G cup, but notes that her left breast is much larger than her right.  We will update photos today.  Summary of Previous Visit: Patient was seen for initial consult 02/19/2020 and then for subsequent visit after completion of physical therapy 09/30/2020.  She continues to endorse frequent headaches and trapezial region discomfort that she believes may be related to her mammary hyperplasia.  She is approximately 36G cup, between 750 and 800 g estimated excess breast tissue removed at time of surgery.  In addition to her headache disorder, she also has allergic rhinitis.  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  PMH Significant for: Macromastia, allergic rhinitis, headache disorder.   Past Medical History: Allergies: Allergies  Allergen Reactions   Latex     Rash itching   Oxycodone     Nausea, dizziness and shaking.    Current Medications:  Current Outpatient Medications:    baclofen (LIORESAL) 10 MG tablet, TAKE 1 TABLET BY MOUTH TWICE A DAY AS NEEDED *LIMIT TO 1 TO 2 TREATMENTS PER WEEK*, Disp: 20 tablet,  Rfl: 0   cephALEXin (KEFLEX) 500 MG capsule, Take 1 capsule (500 mg total) by mouth 4 (four) times daily for 3 days., Disp: 12 capsule, Rfl: 0   doxepin (SINEQUAN) 10 MG capsule, TAKE 1 CAPSULE BY MOUTH AT BEDTIME., Disp: 90 capsule, Rfl: 1   Galcanezumab-gnlm (EMGALITY) 120 MG/ML SOAJ, Inject 120 mg into the skin every 30 (thirty) days., Disp: 1 mL, Rfl: 6   HYDROcodone-acetaminophen (NORCO) 5-325 MG tablet, Take 1 tablet by mouth every 6 (six) hours as needed for up to 5 days for severe pain., Disp: 20 tablet, Rfl: 0   mometasone (NASONEX) 50 MCG/ACT nasal spray, 2 sprays in each nostril, Disp: , Rfl:    ondansetron (ZOFRAN-ODT) 4 MG disintegrating tablet, Take 1 tablet (4 mg total) by mouth every 8 (eight) hours as needed for nausea or vomiting., Disp: 20 tablet, Rfl: 0   rizatriptan (MAXALT-MLT) 10 MG disintegrating tablet, Take 1 tablet (10 mg total) by mouth as needed for migraine. May repeat in 2 hours if needed, Disp: 9 tablet, Rfl: 11   zonisamide (ZONEGRAN) 100 MG capsule, Take 2 capsules (200 mg total) by mouth daily., Disp: 180 capsule, Rfl: 3   EPIPEN 2-PAK 0.3 MG/0.3ML SOAJ injection, Inject 0.3 mLs (0.3 mg total) into the skin once as needed. (Patient not taking: Reported on 01/20/2021), Disp: 1 Device, Rfl:    metoCLOPramide (REGLAN) 10 MG tablet, Take 5 mg by mouth as needed. Pt 2.5 tab daily (Patient not taking: Reported on 01/20/2021),  Disp: , Rfl:    triamcinolone lotion (KENALOG) 0.1 %, APPLY TO AFFECTED AREA TWICE A DAY (Patient not taking: Reported on 01/20/2021), Disp: , Rfl:    valACYclovir (VALTREX) 1000 MG tablet, TAKE 2 TABLETS BY MOUTH EVERY 12 HOURS FOR 1 DAY AS NEEDED FOR OUTBREAK (Patient not taking: Reported on 01/20/2021), Disp: 60 tablet, Rfl: 1  Past Medical Problems: Past Medical History:  Diagnosis Date   ALLERGIC RHINITIS    CHICKENPOX, HX OF    x 2   DEPRESSION    Heart murmur    small no cardiologist   HYPERLIPIDEMIA    diet controlled   IBS  (irritable bowel syndrome)    Iron deficiency anemia    Migraine    Obesity    Recurrent cold sores    Renal cyst    Wears glasses    reading    Past Surgical History: Past Surgical History:  Procedure Laterality Date   BUNIONECTOMY Bilateral feb 2020, and nov 2020   COLONOSCOPY  11/2016   HYSTEROSCOPY N/A 07/13/2019   Procedure: DILATION OF CERVIX, HYSTEROSCOPY, REMOVAL OF INCARCERATED PARAGARD INTRAUTERINE DEVICE;  Surgeon: [name], MD;  Location: [facility];  Service: Gynecology;  Laterality: N/A;   LAPAROSCOPIC TUBAL LIGATION Bilateral 09/04/2019   Procedure: LAPAROSCOPIC TUBAL LIGATION BY CAUTERIZATION;  Surgeon: [name], MD;  Location: [facility];  Service: Gynecology;  Laterality: Bilateral;  request to follow in [place] block time- requests 30 minutes OR time   NO PAST SURGERIES      Social History: Social History   Socioeconomic History   Marital status: Married    Spouse name: Not on file   Number of children: Not on file   Years of education: Not on file   Highest education level: Master's degree (e.g., MA, MS, MEng, MEd, MSW, MBA)  Occupational History   Not on file  Tobacco Use   Smoking status: Never   Smokeless tobacco: Never  Vaping Use   Vaping Use: Never used  Substance and Sexual Activity   Alcohol use: Not Currently    Alcohol/week: 0.0 standard drinks   Drug use: No   Sexual activity: Yes    Partners: Male    Birth control/protection: None, Surgical  Other Topics Concern   Not on file  Social History Narrative   Married since 2004, but separated 10/2012 from spouse. Originally from [place], in [place] since 2008. 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      Lives at home with spouse and dog   Ambidextrous, R dominant    Caffeine: tries to avoid caffeine   Social Determinants of Radio broadcast assistant Strain: Not on file  Food Insecurity: Not on file  Transportation Needs: Not on file   Physical Activity: Not on file  Stress: Not on file  Social Connections: Not on file  Intimate Partner Violence: Not on file    Family History: Family History  Problem Relation Age of Onset   Hypertension Father    Cardiomyopathy Father    Sleep apnea Father    Cancer Mother        pancreatic, liver and renal    Osteoporosis Mother    Renal cancer Mother    Hypertension Paternal Grandfather    Arthritis Other        parent & grandparents   Breast cancer Maternal Aunt 43   Breast cancer Cousin 82   Asthma Sister    Sleep apnea Brother  Asthma Sister    Sleep apnea Brother    Stomach cancer Neg Hx    Rectal cancer Neg Hx    Esophageal cancer Neg Hx    Colon cancer Neg Hx    Migraines Neg Hx    Headache Neg Hx     Review of Systems: ROS Denies recent illness, infection, or rash.  Physical Exam: Vital Signs BP 131/77 (BP Location: Left Arm, Patient Position: Sitting, Cuff Size: Large)    Ht 5\' 3"  (1.6 m)    Wt 179 lb 4.8 oz (81.3 kg)    SpO2 100%    BMI 31.76 kg/m   Physical Exam Constitutional:      General: Not in acute distress.    Appearance: Normal appearance. Not ill-appearing.  HENT:     Head: Normocephalic and atraumatic.  Eyes:     Pupils: Pupils are equal, round. Cardiovascular:     Rate and Rhythm: Normal rate.    Pulses: Normal pulses.  Pulmonary:     Effort: No respiratory distress or increased work of breathing.  Speaks in full sentences. Abdominal:     General: Abdomen is flat. No distension.   Musculoskeletal: Normal range of motion. No lower extremity swelling or edema. No varicosities. Skin:    General: Skin is warm and dry.     Findings: No erythema or rash.  Neurological:     Mental Status: Alert and oriented to person, place, and time.  Psychiatric:        Mood and Affect: Mood normal.        Behavior: Behavior normal.    Assessment/Plan: The patient is scheduled for bilateral breast reduction possible liposuction 02/12/2021  with Dr. Marla Roe.  Risks, benefits, and alternatives of procedure discussed, questions answered and consent obtained.    Smoking Status: Non-smoker. Last Mammogram: 12/2019; Results: BI-RADS Category 1: Negative.  Caprini Score: 34; Risk Factors include: Age, BMI greater than 25, and length of planned surgery. Recommendation for mechanical prophylaxis. Encourage early ambulation.   Pictures obtained: Updated photos today.  Post-op Rx sent to pharmacy: Norco, Zofran, Keflex.  She endorses adverse reactions to oxycodone, but has had no difficulty with hydrocodone.  Patient was provided with the General Surgical Risk consent document and Pain Medication Agreement prior to their appointment.  They had adequate time to read through the risk consent documents and Pain Medication Agreement. We also discussed them in person together during this preop appointment. All of their questions were answered to their satisfaction.  Recommended calling if they have any further questions.  Risk consent form and Pain Medication Agreement to be scanned into patient's chart.  The risk that can be encountered with breast reduction were discussed and include the following but not limited to these:  Breast asymmetry, fluid accumulation, firmness of the breast, inability to breast feed, loss of nipple or areola, skin loss, decrease or no nipple sensation, fat necrosis of the breast tissue, bleeding, infection, healing delay.  There are risks of anesthesia, changes to skin sensation and injury to nerves or blood vessels.  The muscle can be temporarily or permanently injured.  You may have an allergic reaction to tape, suture, glue, blood products which can result in skin discoloration, swelling, pain, skin lesions, poor healing.  Any of these can lead to the need for revisonal surgery or stage procedures.  A reduction has potential to interfere with diagnostic procedures.  Nipple or breast piercing can increase risks of  infection.  This procedure is best done  when the breast is fully developed.  Changes in the breast will continue to occur over time.  Pregnancy can alter the outcomes of previous breast reduction surgery, weight gain and weigh loss can also effect the long term appearance.   The risks that can be encountered with and after liposuction were discussed and include the following but no limited to these: Asymmetry, fluid accumulation, firmness of the area, fat necrosis with death of fat tissue, bleeding, infection, delayed healing, anesthesia risks, skin sensation changes, injury to structures including nerves, blood vessels, and muscles which may be temporary or permanent, allergies to tape, suture materials and glues, blood products, topical preparations or injected agents, skin and contour irregularities, skin discoloration and swelling, deep vein thrombosis, cardiac and pulmonary complications, pain, which may persist, persistent pain, recurrence of the lesion, poor healing of the incision, possible need for revisional surgery or staged procedures. Thiere can also be persistent swelling, poor wound healing, rippling or loose skin, worsening of cellulite, swelling, and thermal burn or heat injury from ultrasound with the ultrasound-assisted lipoplasty technique. Any change in weight fluctuations can alter the outcome.    Electronically signed by: Krista Blue, PA-C 01/20/2021 1:08 PM

## 2021-01-20 ENCOUNTER — Encounter: Payer: Self-pay | Admitting: Physician Assistant

## 2021-01-20 ENCOUNTER — Other Ambulatory Visit: Payer: Self-pay

## 2021-01-20 ENCOUNTER — Ambulatory Visit (INDEPENDENT_AMBULATORY_CARE_PROVIDER_SITE_OTHER): Payer: 59 | Admitting: Physician Assistant

## 2021-01-20 VITALS — BP 131/77 | Ht 63.0 in | Wt 179.3 lb

## 2021-01-20 DIAGNOSIS — N62 Hypertrophy of breast: Secondary | ICD-10-CM

## 2021-01-20 MED ORDER — HYDROCODONE-ACETAMINOPHEN 5-325 MG PO TABS: 1.0000 | ORAL_TABLET | Freq: Four times a day (QID) | ORAL | 0 refills | Status: AC | PRN

## 2021-01-20 MED ORDER — CEPHALEXIN 500 MG PO CAPS: 500.0000 mg | ORAL_CAPSULE | Freq: Four times a day (QID) | ORAL | 0 refills | Status: AC

## 2021-01-20 MED ORDER — ONDANSETRON 4 MG PO TBDP: 4.0000 mg | ORAL_TABLET | Freq: Three times a day (TID) | ORAL | 0 refills | Status: DC | PRN

## 2021-01-21 ENCOUNTER — Other Ambulatory Visit: Payer: Self-pay | Admitting: Internal Medicine

## 2021-01-21 DIAGNOSIS — Z1231 Encounter for screening mammogram for malignant neoplasm of breast: Secondary | ICD-10-CM

## 2021-01-23 ENCOUNTER — Encounter: Payer: No Typology Code available for payment source | Admitting: Surgical

## 2021-01-28 ENCOUNTER — Other Ambulatory Visit: Payer: Self-pay

## 2021-01-28 ENCOUNTER — Ambulatory Visit
Admission: RE | Admit: 2021-01-28 | Discharge: 2021-01-28 | Disposition: A | Discharge status: Home / Self Care | Payer: 59 | Source: Ambulatory Visit | Attending: Internal Medicine | Admitting: Internal Medicine

## 2021-01-28 DIAGNOSIS — Z1231 Encounter for screening mammogram for malignant neoplasm of breast: Secondary | ICD-10-CM

## 2021-01-29 ENCOUNTER — Other Ambulatory Visit: Payer: Self-pay | Admitting: Internal Medicine

## 2021-01-29 ENCOUNTER — Telehealth: Payer: Self-pay

## 2021-01-29 DIAGNOSIS — R928 Other abnormal and inconclusive findings on diagnostic imaging of breast: Secondary | ICD-10-CM

## 2021-01-29 NOTE — Telephone Encounter (Signed)
Patient called to say that her mammogram has been completed in preparation for her surgery on 02/13/2020.

## 2021-02-03 ENCOUNTER — Ambulatory Visit
Admission: RE | Admit: 2021-02-03 | Discharge: 2021-02-03 | Disposition: A | Discharge status: Home / Self Care | Payer: 59 | Source: Ambulatory Visit | Attending: Internal Medicine | Admitting: Internal Medicine

## 2021-02-03 ENCOUNTER — Encounter (HOSPITAL_BASED_OUTPATIENT_CLINIC_OR_DEPARTMENT_OTHER): Payer: Self-pay | Admitting: Plastic Surgery

## 2021-02-03 ENCOUNTER — Other Ambulatory Visit: Payer: Self-pay | Admitting: Internal Medicine

## 2021-02-03 DIAGNOSIS — R928 Other abnormal and inconclusive findings on diagnostic imaging of breast: Secondary | ICD-10-CM

## 2021-02-09 ENCOUNTER — Ambulatory Visit
Admission: RE | Admit: 2021-02-09 | Discharge: 2021-02-09 | Disposition: A | Discharge status: Home / Self Care | Payer: 59 | Source: Ambulatory Visit | Attending: Internal Medicine | Admitting: Internal Medicine

## 2021-02-09 ENCOUNTER — Other Ambulatory Visit: Payer: Self-pay | Admitting: Internal Medicine

## 2021-02-09 DIAGNOSIS — R928 Other abnormal and inconclusive findings on diagnostic imaging of breast: Secondary | ICD-10-CM

## 2021-02-11 NOTE — Anesthesia Preprocedure Evaluation (Addendum)
Anesthesia Evaluation  Patient identified by MRN, date of birth, ID band Patient awake    Reviewed: Allergy & Precautions, NPO status , Patient's Chart, lab work & pertinent test results  Airway Mallampati: II  TM Distance: >3 FB Neck ROM: Full    Dental no notable dental hx. (+) Teeth Intact, Dental Advisory Given   Pulmonary neg pulmonary ROS,    Pulmonary exam normal breath sounds clear to auscultation       Cardiovascular Exercise Tolerance: Good Normal cardiovascular exam Rhythm:Regular Rate:Normal     Neuro/Psych  Headaches, PSYCHIATRIC DISORDERS Depression    GI/Hepatic   Endo/Other    Renal/GU Renal disease     Musculoskeletal  (+) Arthritis ,   Abdominal (+) + obese,   Peds  Hematology   Anesthesia Other Findings All: oxycodone, latex  Reproductive/Obstetrics negative OB ROS                            Anesthesia Physical Anesthesia Plan  ASA: 2  Anesthesia Plan: General   Post-op Pain Management: Dilaudid IV and Ofirmev IV (intra-op)   Induction: Intravenous  PONV Risk Score and Plan: 4 or greater and Midazolam, Dexamethasone, Ondansetron, Treatment may vary due to age or medical condition and Scopolamine patch - Pre-op  Airway Management Planned: Oral ETT  Additional Equipment: None  Intra-op Plan:   Post-operative Plan: Extubation in OR  Informed Consent: I have reviewed the patients History and Physical, chart, labs and discussed the procedure including the risks, benefits and alternatives for the proposed anesthesia with the patient or authorized representative who has indicated his/her understanding and acceptance.     Dental advisory given  Plan Discussed with: CRNA and Anesthesiologist  Anesthesia Plan Comments:        Anesthesia Quick Evaluation

## 2021-02-12 ENCOUNTER — Ambulatory Visit (HOSPITAL_BASED_OUTPATIENT_CLINIC_OR_DEPARTMENT_OTHER): Payer: 59 | Admitting: Anesthesiology

## 2021-02-12 ENCOUNTER — Telehealth: Payer: Self-pay | Admitting: Plastic Surgery

## 2021-02-12 ENCOUNTER — Encounter (HOSPITAL_BASED_OUTPATIENT_CLINIC_OR_DEPARTMENT_OTHER): Payer: Self-pay | Admitting: Plastic Surgery

## 2021-02-12 ENCOUNTER — Encounter (HOSPITAL_BASED_OUTPATIENT_CLINIC_OR_DEPARTMENT_OTHER)
Admission: RE | Disposition: A | Discharge status: Home / Self Care | Payer: Self-pay | Source: Home / Self Care | Attending: Plastic Surgery

## 2021-02-12 ENCOUNTER — Ambulatory Visit (HOSPITAL_BASED_OUTPATIENT_CLINIC_OR_DEPARTMENT_OTHER)
Admission: RE | Admit: 2021-02-12 | Discharge: 2021-02-12 | Disposition: A | Discharge status: Home / Self Care | Payer: 59 | Attending: Plastic Surgery | Admitting: Plastic Surgery

## 2021-02-12 ENCOUNTER — Other Ambulatory Visit: Payer: Self-pay

## 2021-02-12 DIAGNOSIS — M542 Cervicalgia: Secondary | ICD-10-CM

## 2021-02-12 DIAGNOSIS — M549 Dorsalgia, unspecified: Secondary | ICD-10-CM | POA: Insufficient documentation

## 2021-02-12 DIAGNOSIS — N62 Hypertrophy of breast: Secondary | ICD-10-CM

## 2021-02-12 HISTORY — PX: BREAST REDUCTION SURGERY: SHX8

## 2021-02-12 HISTORY — PX: LIPOSUCTION: SHX10

## 2021-02-12 LAB — POCT PREGNANCY, URINE: Preg Test, Ur: NEGATIVE

## 2021-02-12 SURGERY — MAMMOPLASTY, REDUCTION
Anesthesia: General | Site: Breast | Laterality: Bilateral

## 2021-02-12 MED ORDER — OXYCODONE HCL 5 MG PO TABS: 5.0000 mg | ORAL_TABLET | Freq: Once | ORAL | Status: DC | PRN

## 2021-02-12 MED ORDER — LIDOCAINE 2% (20 MG/ML) 5 ML SYRINGE
INTRAMUSCULAR | Status: AC
  Filled 2021-02-12: qty 5

## 2021-02-12 MED ORDER — PROPOFOL 10 MG/ML IV BOLUS
INTRAVENOUS | Status: DC | PRN
  Administered 2021-02-12: 150 mg via INTRAVENOUS

## 2021-02-12 MED ORDER — LIDOCAINE-EPINEPHRINE 1 %-1:100000 IJ SOLN
INTRAMUSCULAR | Status: DC | PRN
  Administered 2021-02-12: 30 mL

## 2021-02-12 MED ORDER — HYDROMORPHONE HCL 1 MG/ML IJ SOLN
0.2500 mg | INTRAMUSCULAR | Status: DC | PRN
  Administered 2021-02-12: 0.5 mg via INTRAVENOUS

## 2021-02-12 MED ORDER — CHLORHEXIDINE GLUCONATE CLOTH 2 % EX PADS: 6.0000 | MEDICATED_PAD | Freq: Once | CUTANEOUS | Status: DC

## 2021-02-12 MED ORDER — LIDOCAINE HCL (CARDIAC) PF 100 MG/5ML IV SOSY
PREFILLED_SYRINGE | INTRAVENOUS | Status: DC | PRN
  Administered 2021-02-12: 100 mg via INTRAVENOUS

## 2021-02-12 MED ORDER — OXYCODONE HCL 5 MG/5ML PO SOLN: 5.0000 mg | Freq: Once | ORAL | Status: DC | PRN

## 2021-02-12 MED ORDER — EPHEDRINE 5 MG/ML INJ
INTRAVENOUS | Status: AC
  Filled 2021-02-12: qty 5

## 2021-02-12 MED ORDER — LIDOCAINE HCL (PF) 1 % IJ SOLN
INTRAMUSCULAR | Status: AC
  Filled 2021-02-12: qty 60

## 2021-02-12 MED ORDER — SODIUM CHLORIDE 0.9% FLUSH: 3.0000 mL | Freq: Two times a day (BID) | INTRAVENOUS | Status: DC

## 2021-02-12 MED ORDER — CEFAZOLIN SODIUM-DEXTROSE 2-4 GM/100ML-% IV SOLN
INTRAVENOUS | Status: AC
  Filled 2021-02-12: qty 100

## 2021-02-12 MED ORDER — DEXMEDETOMIDINE (PRECEDEX) IN NS 20 MCG/5ML (4 MCG/ML) IV SYRINGE
PREFILLED_SYRINGE | INTRAVENOUS | Status: AC
  Filled 2021-02-12: qty 5

## 2021-02-12 MED ORDER — ROCURONIUM BROMIDE 100 MG/10ML IV SOLN
INTRAVENOUS | Status: DC | PRN
  Administered 2021-02-12: 50 mg via INTRAVENOUS

## 2021-02-12 MED ORDER — FENTANYL CITRATE (PF) 100 MCG/2ML IJ SOLN
INTRAMUSCULAR | Status: AC
  Filled 2021-02-12: qty 2

## 2021-02-12 MED ORDER — AMISULPRIDE (ANTIEMETIC) 5 MG/2ML IV SOLN
10.0000 mg | Freq: Once | INTRAVENOUS | Status: AC | PRN
  Administered 2021-02-12: 10 mg via INTRAVENOUS

## 2021-02-12 MED ORDER — DEXAMETHASONE SODIUM PHOSPHATE 4 MG/ML IJ SOLN
INTRAMUSCULAR | Status: DC | PRN
  Administered 2021-02-12: 10 mg via INTRAVENOUS

## 2021-02-12 MED ORDER — FENTANYL CITRATE (PF) 100 MCG/2ML IJ SOLN
25.0000 ug | INTRAMUSCULAR | Status: DC | PRN
  Administered 2021-02-12: 25 ug via INTRAVENOUS

## 2021-02-12 MED ORDER — SODIUM CHLORIDE 0.9% FLUSH: 3.0000 mL | INTRAVENOUS | Status: DC | PRN

## 2021-02-12 MED ORDER — SUGAMMADEX SODIUM 500 MG/5ML IV SOLN
INTRAVENOUS | Status: AC
  Filled 2021-02-12: qty 5

## 2021-02-12 MED ORDER — PHENYLEPHRINE 40 MCG/ML (10ML) SYRINGE FOR IV PUSH (FOR BLOOD PRESSURE SUPPORT)
PREFILLED_SYRINGE | INTRAVENOUS | Status: AC
  Filled 2021-02-12: qty 10

## 2021-02-12 MED ORDER — HYDROMORPHONE HCL 1 MG/ML IJ SOLN
INTRAMUSCULAR | Status: AC
  Filled 2021-02-12: qty 1

## 2021-02-12 MED ORDER — ONDANSETRON HCL 4 MG/2ML IJ SOLN: 4.0000 mg | Freq: Once | INTRAMUSCULAR | Status: DC | PRN

## 2021-02-12 MED ORDER — ONDANSETRON HCL 4 MG/2ML IJ SOLN
INTRAMUSCULAR | Status: AC
  Filled 2021-02-12: qty 2

## 2021-02-12 MED ORDER — ACETAMINOPHEN 325 MG RE SUPP: 650.0000 mg | RECTAL | Status: DC | PRN

## 2021-02-12 MED ORDER — CEFAZOLIN SODIUM-DEXTROSE 2-4 GM/100ML-% IV SOLN
2.0000 g | INTRAVENOUS | Status: AC
  Administered 2021-02-12: 2 g via INTRAVENOUS

## 2021-02-12 MED ORDER — AMISULPRIDE (ANTIEMETIC) 5 MG/2ML IV SOLN
INTRAVENOUS | Status: AC
  Filled 2021-02-12: qty 2

## 2021-02-12 MED ORDER — EPINEPHRINE PF 1 MG/ML IJ SOLN
INTRAMUSCULAR | Status: AC
  Filled 2021-02-12: qty 1

## 2021-02-12 MED ORDER — ONDANSETRON HCL 4 MG/2ML IJ SOLN
INTRAMUSCULAR | Status: DC | PRN
  Administered 2021-02-12: 4 mg via INTRAVENOUS

## 2021-02-12 MED ORDER — ROCURONIUM BROMIDE 10 MG/ML (PF) SYRINGE
PREFILLED_SYRINGE | INTRAVENOUS | Status: AC
  Filled 2021-02-12: qty 10

## 2021-02-12 MED ORDER — HYDROMORPHONE HCL 1 MG/ML IJ SOLN
INTRAMUSCULAR | Status: AC
  Filled 2021-02-12: qty 0.5

## 2021-02-12 MED ORDER — ACETAMINOPHEN 325 MG PO TABS: 650.0000 mg | ORAL_TABLET | ORAL | Status: DC | PRN

## 2021-02-12 MED ORDER — MIDAZOLAM HCL 2 MG/2ML IJ SOLN
INTRAMUSCULAR | Status: AC
  Filled 2021-02-12: qty 2

## 2021-02-12 MED ORDER — ACETAMINOPHEN 10 MG/ML IV SOLN: 1000.0000 mg | Freq: Once | INTRAVENOUS | Status: DC | PRN

## 2021-02-12 MED ORDER — FENTANYL CITRATE (PF) 100 MCG/2ML IJ SOLN
INTRAMUSCULAR | Status: DC | PRN
  Administered 2021-02-12: 100 ug via INTRAVENOUS
  Administered 2021-02-12: 50 ug via INTRAVENOUS

## 2021-02-12 MED ORDER — DEXAMETHASONE SODIUM PHOSPHATE 10 MG/ML IJ SOLN
INTRAMUSCULAR | Status: AC
  Filled 2021-02-12: qty 1

## 2021-02-12 MED ORDER — SUGAMMADEX SODIUM 200 MG/2ML IV SOLN
INTRAVENOUS | Status: DC | PRN
  Administered 2021-02-12: 200 mg via INTRAVENOUS

## 2021-02-12 MED ORDER — BUPIVACAINE-EPINEPHRINE (PF) 0.25% -1:200000 IJ SOLN
INTRAMUSCULAR | Status: AC
  Filled 2021-02-12: qty 60

## 2021-02-12 MED ORDER — 0.9 % SODIUM CHLORIDE (POUR BTL) OPTIME
TOPICAL | Status: DC | PRN
  Administered 2021-02-12: 500 mL

## 2021-02-12 MED ORDER — SUCCINYLCHOLINE CHLORIDE 200 MG/10ML IV SOSY
PREFILLED_SYRINGE | INTRAVENOUS | Status: AC
  Filled 2021-02-12: qty 10

## 2021-02-12 MED ORDER — SODIUM CHLORIDE 0.9 % IV SOLN: 250.0000 mL | INTRAVENOUS | Status: DC | PRN

## 2021-02-12 MED ORDER — MIDAZOLAM HCL 5 MG/5ML IJ SOLN
INTRAMUSCULAR | Status: DC | PRN
  Administered 2021-02-12: 2 mg via INTRAVENOUS

## 2021-02-12 MED ORDER — DEXMEDETOMIDINE (PRECEDEX) IN NS 20 MCG/5ML (4 MCG/ML) IV SYRINGE
PREFILLED_SYRINGE | INTRAVENOUS | Status: DC | PRN
  Administered 2021-02-12: 8 ug via INTRAVENOUS

## 2021-02-12 MED ORDER — LIDOCAINE-EPINEPHRINE 1 %-1:100000 IJ SOLN
INTRAMUSCULAR | Status: AC
  Filled 2021-02-12: qty 2

## 2021-02-12 MED ORDER — LACTATED RINGERS IV SOLN: INTRAVENOUS | Status: DC

## 2021-02-12 MED ORDER — ATROPINE SULFATE 0.4 MG/ML IV SOLN
INTRAVENOUS | Status: AC
  Filled 2021-02-12: qty 1

## 2021-02-12 MED ORDER — HYDROMORPHONE HCL 1 MG/ML IJ SOLN
INTRAMUSCULAR | Status: DC | PRN
Start: 2021-02-12 — End: 2021-02-12
  Administered 2021-02-12: .5 mg via INTRAVENOUS

## 2021-02-12 SURGICAL SUPPLY — 84 items
ADH SKN CLS APL DERMABOND .7 (GAUZE/BANDAGES/DRESSINGS) ×8
BAG DECANTER FOR FLEXI CONT (MISCELLANEOUS) ×2 IMPLANT
BINDER ABDOMINAL  9 SM 30-45 (SOFTGOODS)
BINDER ABDOMINAL 10 UNV 27-48 (MISCELLANEOUS) IMPLANT
BINDER ABDOMINAL 12 SM 30-45 (SOFTGOODS) IMPLANT
BINDER ABDOMINAL 9 SM 30-45 (SOFTGOODS) IMPLANT
BINDER BREAST LRG (GAUZE/BANDAGES/DRESSINGS) IMPLANT
BINDER BREAST MEDIUM (GAUZE/BANDAGES/DRESSINGS) IMPLANT
BINDER BREAST XLRG (GAUZE/BANDAGES/DRESSINGS) ×1 IMPLANT
BINDER BREAST XXLRG (GAUZE/BANDAGES/DRESSINGS) IMPLANT
BIOPATCH RED 1 DISK 7.0 (GAUZE/BANDAGES/DRESSINGS) IMPLANT
BLADE HEX COATED 2.75 (ELECTRODE) ×2 IMPLANT
BLADE KNIFE PERSONA 10 (BLADE) ×8 IMPLANT
BLADE SURG 15 STRL LF DISP TIS (BLADE) ×2 IMPLANT
BLADE SURG 15 STRL SS (BLADE) ×3
BNDG GAUZE ELAST 4 BULKY (GAUZE/BANDAGES/DRESSINGS) ×4 IMPLANT
CANISTER SUCT 1200ML W/VALVE (MISCELLANEOUS) ×3 IMPLANT
COVER BACK TABLE 60X90IN (DRAPES) ×3 IMPLANT
COVER MAYO STAND STRL (DRAPES) ×3 IMPLANT
DECANTER SPIKE VIAL GLASS SM (MISCELLANEOUS) IMPLANT
DERMABOND ADVANCED (GAUZE/BANDAGES/DRESSINGS) ×4
DERMABOND ADVANCED .7 DNX12 (GAUZE/BANDAGES/DRESSINGS) ×4 IMPLANT
DRAIN CHANNEL 19F RND (DRAIN) IMPLANT
DRAPE LAPAROSCOPIC ABDOMINAL (DRAPES) ×3 IMPLANT
DRSG OPSITE POSTOP 4X12 (GAUZE/BANDAGES/DRESSINGS) ×2 IMPLANT
DRSG OPSITE POSTOP 4X6 (GAUZE/BANDAGES/DRESSINGS) ×2 IMPLANT
DRSG PAD ABDOMINAL 8X10 ST (GAUZE/BANDAGES/DRESSINGS) ×6 IMPLANT
ELECT BLADE 4.0 EZ CLEAN MEGAD (MISCELLANEOUS) ×3
ELECT COATED BLADE 2.86 ST (ELECTRODE) ×1 IMPLANT
ELECT REM PT RETURN 9FT ADLT (ELECTROSURGICAL) ×6
ELECTRODE BLDE 4.0 EZ CLN MEGD (MISCELLANEOUS) IMPLANT
ELECTRODE REM PT RTRN 9FT ADLT (ELECTROSURGICAL) ×2 IMPLANT
EVACUATOR SILICONE 100CC (DRAIN) IMPLANT
GAUZE SPONGE 4X4 12PLY STRL LF (GAUZE/BANDAGES/DRESSINGS) IMPLANT
GLOVE SURG ENC MOIS LTX SZ6.5 (GLOVE) ×6 IMPLANT
GLOVE SURG ENC TEXT LTX SZ7.5 (GLOVE) ×2 IMPLANT
GLOVE SURG POLYISO LF SZ6.5 (GLOVE) ×2 IMPLANT
GLOVE SURG POLYISO LF SZ7.5 (GLOVE) ×1 IMPLANT
GLOVE SURG UNDER POLY LF SZ6.5 (GLOVE) ×2 IMPLANT
GLOVE SURG UNDER POLY LF SZ7 (GLOVE) ×1 IMPLANT
GOWN STRL REUS W/ TWL LRG LVL3 (GOWN DISPOSABLE) ×6 IMPLANT
GOWN STRL REUS W/ TWL XL LVL3 (GOWN DISPOSABLE) IMPLANT
GOWN STRL REUS W/TWL LRG LVL3 (GOWN DISPOSABLE) ×9
GOWN STRL REUS W/TWL XL LVL3 (GOWN DISPOSABLE) ×9
LINER CANISTER 1000CC FLEX (MISCELLANEOUS) ×2 IMPLANT
NDL FILTER BLUNT 18X1 1/2 (NEEDLE) ×2 IMPLANT
NDL HYPO 25X1 1.5 SAFETY (NEEDLE) ×2 IMPLANT
NDL SAFETY ECLIPSE 18X1.5 (NEEDLE) ×2 IMPLANT
NEEDLE FILTER BLUNT 18X 1/2SAF (NEEDLE)
NEEDLE FILTER BLUNT 18X1 1/2 (NEEDLE) IMPLANT
NEEDLE HYPO 18GX1.5 SHARP (NEEDLE)
NEEDLE HYPO 25X1 1.5 SAFETY (NEEDLE) ×3 IMPLANT
NS IRRIG 1000ML POUR BTL (IV SOLUTION) ×1 IMPLANT
PACK BASIN DAY SURGERY FS (CUSTOM PROCEDURE TRAY) ×3 IMPLANT
PAD ALCOHOL SWAB (MISCELLANEOUS) ×2 IMPLANT
PAD FOAM SILICONE BACKED (GAUZE/BANDAGES/DRESSINGS) IMPLANT
PENCIL SMOKE EVACUATOR (MISCELLANEOUS) ×4 IMPLANT
PIN SAFETY STERILE (MISCELLANEOUS) IMPLANT
SLEEVE SCD COMPRESS KNEE MED (STOCKING) ×3 IMPLANT
SPONGE T-LAP 18X18 ~~LOC~~+RFID (SPONGE) ×7 IMPLANT
STRIP SUTURE WOUND CLOSURE 1/2 (MISCELLANEOUS) ×6 IMPLANT
SUT MNCRL AB 4-0 PS2 18 (SUTURE) ×15 IMPLANT
SUT MON AB 3-0 SH 27 (SUTURE) ×15
SUT MON AB 3-0 SH27 (SUTURE) ×8 IMPLANT
SUT MON AB 5-0 PS2 18 (SUTURE) ×5 IMPLANT
SUT PDS 3-0 CT2 (SUTURE) ×15
SUT PDS II 3-0 CT2 27 ABS (SUTURE) IMPLANT
SUT SILK 3 0 PS 1 (SUTURE) ×4 IMPLANT
SUT VIC AB 3-0 SH 27 (SUTURE)
SUT VIC AB 3-0 SH 27X BRD (SUTURE) IMPLANT
SUT VICRYL 4-0 PS2 18IN ABS (SUTURE) IMPLANT
SYR 3ML 23GX1 SAFETY (SYRINGE) IMPLANT
SYR 50ML LL SCALE MARK (SYRINGE) ×2 IMPLANT
SYR BULB IRRIG 60ML STRL (SYRINGE) ×3 IMPLANT
SYR CONTROL 10ML LL (SYRINGE) ×3 IMPLANT
SYR TB 1ML LL NO SAFETY (SYRINGE) ×2 IMPLANT
TAPE MEASURE VINYL STERILE (MISCELLANEOUS) IMPLANT
TOWEL GREEN STERILE FF (TOWEL DISPOSABLE) ×6 IMPLANT
TRAY DSU PREP LF (CUSTOM PROCEDURE TRAY) ×3 IMPLANT
TUBE CONNECTING 20X1/4 (TUBING) ×3 IMPLANT
TUBING INFILTRATION IT-10001 (TUBING) IMPLANT
TUBING SET GRADUATE ASPIR 12FT (MISCELLANEOUS) ×2 IMPLANT
UNDERPAD 30X36 HEAVY ABSORB (UNDERPADS AND DIAPERS) ×6 IMPLANT
YANKAUER SUCT BULB TIP NO VENT (SUCTIONS) ×3 IMPLANT

## 2021-02-12 NOTE — Transfer of Care (Signed)
Immediate Anesthesia Transfer of Care Note  Patient: Maria Frank  Procedure(s) Performed: BILATERAL MAMMARY REDUCTION  (BREAST) (Bilateral: Breast) possible, LIPOSUCTION (Bilateral)  Patient Location: PACU  Anesthesia Type:General  Level of Consciousness: awake, alert , oriented, drowsy and patient cooperative  Airway & Oxygen Therapy: Patient Spontanous Breathing and Patient connected to face mask oxygen  Post-op Assessment: Report given to RN and Post -op Vital signs reviewed and stable  Post vital signs: Reviewed and stable  Last Vitals:  Vitals Value Taken Time  BP    Temp    Pulse 92 02/12/21 0924  Resp    SpO2 100 % 02/12/21 0924  Vitals shown include unvalidated device data.  Last Pain:  Vitals:   02/12/21 0652  TempSrc: Oral  PainSc: 0-No pain         Complications: No notable events documented.

## 2021-02-12 NOTE — Op Note (Addendum)
Breast Reduction Op note:    DATE OF PROCEDURE: 02/12/2021  LOCATION: [place]  SURGEON: Lyndee Leo Sanger Aeris Hersman, DO  ASSISTANT: Dr. Lennice Sites, Roetta Sessions, PA  PREOPERATIVE DIAGNOSIS 1. Macromastia 2. Neck Pain 3. Back Pain  POSTOPERATIVE DIAGNOSIS 1. Macromastia 2. Neck Pain 3. Back Pain  PROCEDURES 1. Bilateral breast reduction.  Right reduction 600 g, Left reduction 403 g  COMPLICATIONS: None.  DRAINS: none  INDICATIONS FOR PROCEDURE Maria Frank is a 46 y.o. year-old female born on 1975/09/09,with a history of symptomatic macromastia with concominant back pain, neck pain, shoulder grooving from her bra.   MRN: [id number]  CONSENT Informed consent was obtained directly from the patient. The risks, benefits and alternatives were fully discussed. Specific risks including but not limited to bleeding, infection, hematoma, seroma, scarring, pain, nipple necrosis, asymmetry, poor cosmetic results, and need for further surgery were discussed. The patient had ample opportunity to have her questions answered to her satisfaction.  DESCRIPTION OF PROCEDURE  Patient was brought into the operating room and placed in a supine position.  SCDs were placed and appropriate padding was performed.  Antibiotics were given. The patient underwent general anesthesia and the chest was prepped and draped in a sterile fashion.  A timeout was performed and all information was confirmed to be correct.  Right side: Preoperative markings were confirmed.  Incision lines were injected with local with epinephrine.  After waiting for vasoconstriction, the marked lines were incised.  A Wise-pattern superomedial breast reduction was performed by de-epithelializing the pedicle, using bovie to create the superomedial pedicle, and removing breast tissue from the lateral and inferior portions of the breast.  Care was taken to not undermine the breast pedicle. Hemostasis was  achieved.  The nipple was gently rotated into position and the soft tissue closed with 4-0 Monocryl.   The pocket was irrigated and hemostasis confirmed.  The deep tissues were approximated with 3-0 PDS and  Monocryl sutures and the skin was closed with deep dermal and subcuticular 4-0 Monocryl sutures.  The nipple and skin flaps had good capillary refill at the end of the procedure.    Left side: Preoperative markings were confirmed.  Incision lines were injected with local with epinephrine.  After waiting for vasoconstriction, the marked lines were incised.  A Wise-pattern superomedial breast reduction was performed by de-epithelializing the pedicle, using bovie to create the superomedial pedicle, and removing breast tissue from the lateral and inferior portions of the breast.  Care was taken to not undermine the breast pedicle. Hemostasis was achieved.  The nipple was gently rotated into position and the soft tissue was closed with 4-0 Monocryl.  The patient was sat upright and size and shape symmetry was confirmed.  The pocket was irrigated and hemostasis confirmed.  The deep tissues were approximated with 3-0 PDS and Monocryl sutures and the skin was closed with deep dermal and subcuticular 4-0 Monocryl sutures.  Dermabond was applied.  A breast binder and ABDs were placed.  The nipple and skin flaps had good capillary refill at the end of the procedure.  The patient tolerated the procedure well. The patient was allowed to wake from anesthesia and taken to the recovery room in satisfactory condition.  Dr. Erin Hearing assisted throughout the case.  He was essential in retraction and counter traction when needed to make the case progress smoothly.  This retraction and assistance made it possible to see the tissue plans for the procedure.  The assistance was needed for  blood control, tissue re-approximation and assisted with closure of the incision site.

## 2021-02-12 NOTE — Anesthesia Procedure Notes (Signed)

## 2021-02-12 NOTE — Anesthesia Postprocedure Evaluation (Signed)
Anesthesia Post Note  Patient: Maria Frank  Procedure(s) Performed: BILATERAL MAMMARY REDUCTION  (BREAST) (Bilateral: Breast) possible, LIPOSUCTION (Bilateral)     Patient location during evaluation: PACU Anesthesia Type: General Level of consciousness: awake and alert Pain management: pain level controlled Vital Signs Assessment: post-procedure vital signs reviewed and stable Respiratory status: spontaneous breathing, nonlabored ventilation, respiratory function stable and patient connected to nasal cannula oxygen Cardiovascular status: blood pressure returned to baseline and stable Postop Assessment: no apparent nausea or vomiting Anesthetic complications: no   No notable events documented.  Last Vitals:  Vitals:   02/12/21 1015 02/12/21 1037  BP: 126/73 (!) 142/83  Pulse: 76 80  Resp: 13 16  Temp:  37 C  SpO2: 97% 96%    Last Pain:  Vitals:   02/12/21 1037  TempSrc:   PainSc: 3                  Barnet Glasgow

## 2021-02-12 NOTE — Telephone Encounter (Signed)
Patient's husband called following surgery and wanted to let us know he picked up 3 of the 4 prescriptions that were mentioned on a sheet that they were given but 1, the valium, was not available for pick up. They wanted to make sure that this was not something she needed to make sure she had.  Please follow up with patient.

## 2021-02-12 NOTE — Discharge Instructions (Addendum)
INSTRUCTIONS FOR AFTER BREAST SURGERY   You will likely have some questions about what to expect following your operation.  The following information will help you and your family understand what to expect when you are discharged from the hospital.  Following these guidelines will help ensure a smooth recovery and reduce risks of complications.  Postoperative instructions include information on: diet, wound care, medications and physical activity.  AFTER SURGERY Expect to go home after the procedure.  In some cases, you may need to spend one night in the hospital for observation.  DIET Breast surgery does not require a specific diet.  However, the healthier you eat the better your body can start healing. It is important to increasing your protein intake.  This means limiting the foods with sugar and carbohydrates.  Focus on vegetables and some meat.  If you have any liposuction during your procedure be sure to drink water.  If your urine is bright yellow, then it is concentrated, and you need to drink more water.  As a general rule after surgery, you should have 8 ounces of water every hour while awake.  If you find you are persistently nauseated or unable to take in liquids let us know.  NO TOBACCO USE or EXPOSURE.  This will slow your healing process and increase the risk of a wound.  WOUND CARE Leave the ACE wrap or binder on for 3 days . Use fragrance free soap.   After 3 days you can remove the ACE wrap or binder to shower. Once dry apply ACE wrap, binder or sports bra.  Use a mild soap like Dial, Dove and [place]. You may have Topifoam or Lipofoam on.  It is soft and spongy and helps keep you from getting creases if you have liposuction.  This can be removed before the shower and then replaced.  If you need more it is available on [employer] (Lipofoam). If you have steri-strips / tape directly attached to your skin leave them in place. It is OK to get these wet.   No baths, pools or hot tubs for four  weeks. We close your incision to leave the smallest and best-looking scar. No ointment or creams on your incisions until given the go ahead.  Especially not Neosporin (Too many skin reactions with this one).  A few weeks after surgery you can use Mederma and start massaging the scar. We ask you to wear your binder or sports bra for the first 6 weeks around the clock, including while sleeping. This provides added comfort and helps reduce the fluid accumulation at the surgery site.  ACTIVITY No heavy lifting until cleared by the doctor.  This usually means no more than a half-gallon of milk.  It is OK to walk and climb stairs. In fact, moving your legs is very important to decrease your risk of a blood clot.  It will also help keep you from getting deconditioned.  Every 1 to 2 hours get up and walk for 5 minutes. This will help with a quicker recovery back to normal.  Let pain be your guide so you don't do too much.  This is not the time for spring cleaning and don't plan on taking care of anyone else.  This time is for you to recover,  You will be more comfortable if you sleep and rest with your head elevated either with a few pillows under you or in a recliner.  No stomach sleeping for a three months.  WORK Everyone  returns to work at different times. As a rough guide, most people take at least 1 - 2 weeks off prior to returning to work. If you need documentation for your job, bring the forms to your postoperative follow up visit.  DRIVING Arrange for someone to bring you home from the hospital.  You may be able to drive a few days after surgery but not while taking any narcotics or valium.  BOWEL MOVEMENTS Constipation can occur after anesthesia and while taking pain medication.  It is important to stay ahead for your comfort.  We recommend taking Milk of Magnesia (2 tablespoons; twice a day) while taking the pain pills.  MEDICATIONS You may be prescribed should start after surgery At your  preoperative visit for you history and physical you may have been given the following medications: An antibiotic: Start this medication when you get home and take according to the instructions on the bottle. Zofran 4 mg:  This is to treat nausea and vomiting.  You can take this every 6 hours as needed and only if needed. Valium 2 mg: This is for muscle tightness if you have an implant or expander. This will help relax your muscle which also helps with pain control.  This can be taken every 12 hours as needed. Don't drive after taking this medication. Norco (hydrocodone/acetaminophen) 5/325 mg:  This is only to be used after you have taken the motrin or the tylenol. Every 8 hours as needed.   Over the counter Medication to take: Ibuprofen (Motrin) 600 mg:  Take this every 6 hours.  If you have additional pain then take 500 mg of the tylenol every 8 hours.  Only take the Norco after you have tried these two. Miralax or stool softener of choice: Take this according to the bottle if you take the [place] Call your surgeon's office if any of the following occur: Fever 101 degrees F or greater Excessive bleeding or fluid from the incision site. Pain that increases over time without aid from the medications Redness, warmth, or pus draining from incision sites Persistent nausea or inability to take in liquids Severe misshapen area that underwent the operation.  Here are some resources:  Plastic surgery website: https://www.plasticsurgery.org/for-medical-professionals/education-and-resources/publications/breast-reconstruction-magazine Breast Reconstruction Awareness Campaign:  [employer] Plastic surgery Implant information:  https://www.plasticsurgery.org/patient-safety/breast-implant-safety    Post Anesthesia Home Care Instructions  Activity: Get plenty of rest for the remainder of the day. A responsible individual must stay with you for 24 hours following the  procedure.  For the next 24 hours, DO NOT: -Drive a car -Paediatric nurse -Drink alcoholic beverages -Take any medication unless instructed by your physician -Make any legal decisions or sign important papers.  Meals: Start with liquid foods such as gelatin or soup. Progress to regular foods as tolerated. Avoid greasy, spicy, heavy foods. If nausea and/or vomiting occur, drink only clear liquids until the nausea and/or vomiting subsides. Call your physician if vomiting continues.  Special Instructions/Symptoms: Your throat may feel dry or sore from the anesthesia or the breathing tube placed in your throat during surgery. If this causes discomfort, gargle with warm salt water. The discomfort should disappear within 24 hours.  If you had a scopolamine patch placed behind your ear for the management of post- operative nausea and/or vomiting:  1. The medication in the patch is effective for 72 hours, after which it should be removed.  Wrap patch in a tissue and discard in the trash. Wash hands thoroughly with soap and  water. 2. You may remove the patch earlier than 72 hours if you experience unpleasant side effects which may include dry mouth, dizziness or visual disturbances. 3. Avoid touching the patch. Wash your hands with soap and water after contact with the patch.

## 2021-02-12 NOTE — Interval H&P Note (Signed)
History and Physical Interval Note:  02/12/2021 7:05 AM  Maria Frank  has presented today for surgery, with the diagnosis of Symptomatic mammary hypertrophy.  The various methods of treatment have been discussed with the patient and family. After consideration of risks, benefits and other options for treatment, the patient has consented to  Procedure(s): MAMMARY REDUCTION  (BREAST) (Bilateral) possible, LIPOSUCTION (Bilateral) as a surgical intervention.  The patient's history has been reviewed, patient examined, no change in status, stable for surgery.  I have reviewed the patient's chart and labs.  Questions were answered to the patient's satisfaction.     Loel Lofty Kemuel Buchmann

## 2021-02-13 ENCOUNTER — Encounter (HOSPITAL_BASED_OUTPATIENT_CLINIC_OR_DEPARTMENT_OTHER): Payer: Self-pay | Admitting: Plastic Surgery

## 2021-02-13 ENCOUNTER — Ambulatory Visit: Payer: 59

## 2021-02-13 LAB — SURGICAL PATHOLOGY

## 2021-02-13 NOTE — Telephone Encounter (Signed)
Informed patient that she did not need the valium. She conveyed understanding.

## 2021-02-24 ENCOUNTER — Ambulatory Visit (INDEPENDENT_AMBULATORY_CARE_PROVIDER_SITE_OTHER): Payer: 59 | Admitting: Plastic Surgery

## 2021-02-24 ENCOUNTER — Encounter: Payer: Self-pay | Admitting: Plastic Surgery

## 2021-02-24 ENCOUNTER — Other Ambulatory Visit: Payer: Self-pay

## 2021-02-24 DIAGNOSIS — N62 Hypertrophy of breast: Secondary | ICD-10-CM

## 2021-02-24 NOTE — Progress Notes (Signed)
The patient is a 46 year old female here for follow-up on her breast reduction surgery.  She had the surgery on January 12 and had 600 g removed from the right and 750 g removed from the left.  She is doing really well.  There is good capillary refill and no sign of a seroma or hematoma.  The dressings are in place and she still wearing the sports bra or binder.  She can remove the dressing in the next week and then I would like to see her the week after.  She states that she already feels relief in her shoulder area.

## 2021-03-04 ENCOUNTER — Encounter: Payer: No Typology Code available for payment source | Admitting: Internal Medicine

## 2021-03-06 ENCOUNTER — Encounter: Payer: No Typology Code available for payment source | Admitting: Surgical

## 2021-03-10 ENCOUNTER — Other Ambulatory Visit: Payer: 59

## 2021-03-13 ENCOUNTER — Ambulatory Visit (INDEPENDENT_AMBULATORY_CARE_PROVIDER_SITE_OTHER): Payer: 59 | Admitting: Surgical

## 2021-03-13 ENCOUNTER — Other Ambulatory Visit: Payer: Self-pay

## 2021-03-13 DIAGNOSIS — G8929 Other chronic pain: Secondary | ICD-10-CM

## 2021-03-13 DIAGNOSIS — M546 Pain in thoracic spine: Secondary | ICD-10-CM

## 2021-03-13 DIAGNOSIS — N62 Hypertrophy of breast: Secondary | ICD-10-CM

## 2021-03-13 NOTE — Progress Notes (Signed)
46 year old female here for follow-up after bilateral breast reduction with Dr. Marla Roe on 02/12/2021.  She reports overall she is doing really well, has some itching, believes it is from the Gage.  She reports she recently moved to [place] for a new job.  She still has her house here in [place].  Chaperone present on exam On exam bilateral NAC's are viable, bilateral breast incisions are intact.  No subcutaneous fluid collections noted.  Bilateral breasts are symmetric.  Discussed ongoing restrictions for 2 more weeks.  Discussed compressive garments.  All of her questions were answered to her content.  She can begin using scar creams.  Follow-up as needed.  Call with questions or concerns.  Pictures were obtained of the patient and placed in the chart with the patient's or guardian's permission.

## 2021-03-20 ENCOUNTER — Encounter: Payer: No Typology Code available for payment source | Admitting: Surgical

## 2021-05-01 DIAGNOSIS — Z719 Counseling, unspecified: Secondary | ICD-10-CM

## 2021-06-20 ENCOUNTER — Other Ambulatory Visit: Payer: Self-pay | Admitting: Internal Medicine

## 2021-08-11 ENCOUNTER — Telehealth (INDEPENDENT_AMBULATORY_CARE_PROVIDER_SITE_OTHER): Payer: 59 | Admitting: Family Medicine

## 2021-08-11 ENCOUNTER — Encounter: Payer: Self-pay | Admitting: Family Medicine

## 2021-08-11 DIAGNOSIS — G43109 Migraine with aura, not intractable, without status migrainosus: Secondary | ICD-10-CM | POA: Diagnosis not present

## 2021-08-11 MED ORDER — ZONISAMIDE 100 MG PO CAPS: 200.0000 mg | ORAL_CAPSULE | Freq: Every day | ORAL | 3 refills | Status: AC

## 2021-08-11 MED ORDER — BACLOFEN 10 MG PO TABS: ORAL_TABLET | ORAL | 0 refills | Status: AC

## 2021-08-11 MED ORDER — RIZATRIPTAN BENZOATE 10 MG PO TBDP: 10.0000 mg | ORAL_TABLET | ORAL | 11 refills | Status: AC | PRN

## 2021-08-11 MED ORDER — EMGALITY 120 MG/ML ~~LOC~~ SOAJ: 120.0000 mg | SUBCUTANEOUS | 3 refills | Status: AC

## 2021-08-11 NOTE — Progress Notes (Signed)
PATIENT: Maria Frank DOB: October 19, 1975  REASON FOR VISIT: follow up HISTORY FROM: patient  Virtual Visit via Telephone Note  I connected with Maria Frank on 08/11/21 at  7:30 AM EDT by telephone and verified that I am speaking with the correct person using two identifiers.   I discussed the limitations, risks, security and privacy concerns of performing an evaluation and management service by telephone and the availability of in person appointments. I also discussed with the patient that there may be a patient responsible charge related to this service. The patient expressed understanding and agreed to proceed.   History of Present Illness:  08/11/21 ALL: TYAISHA CULLOM is a 46 y.o. female here today for follow up for migraines. She continues zonisamide '200mg'$  daily and Emgality. Rizatriptan and baclofen used for abortive therapy. She reports rare headaches. She may have one per month. She has used baclofen 2-3 times over the past 6 months. She has moved to [place] and planning to establish care with new neurologist in September.    History (copied from Dr Cathren Laine previous note)  Interval history: Patient here for follow up of migraines. Doing well on Emgality. Has significantly decreased her headaches and migraines. Her current headaches/migraines are mild and 1-2x a week, she will take maxalt and works well, She wants to stay on the Zonisamide, we discussed decreasing abd she will think about it. She feels well. She is going through menopause.    HPI:  Maria Frank is a 46 y.o. female here as requested by Binnie Rail, MD for intractable migraines. PMHx: Obesity, migraine, irritable bowel syndrome, hyperlipidemia, heart murmur, depression, cluster headaches, osteopenia, eczema, chronic neck pain, back pain, insomnia.  I reviewed Dr. Quay Burow notes: She is on zonisamide, baclofen, she has daily headaches, she has chronic neck pain and large breasts causing back pain in the upper and  lower back and she has seen Dr. Tedra Coupe him in the past for breast reduction surgery evaluation, she was [facility] Domingo [facility]ing [facility] with minimal improvement, having daily headaches, not as they used to be but not as well-controlled as she would like, also neck issues, has done PT, referred to neurology.   Headaches started with 12 years ago. It would start with vision in her left eye changes would narrow, she would get sensitive to light in sound, stabbing in the left eye, throbbing, unknown autonomic symptoms, she also has a congenital eye syndrome accompanying nausea, phonophobia, couldn't move, if she moved it was worse, throbbing, pounding, a muscle relaxer was prescribed and she felt like the bride in 16 candles (we laughed, that's hilarious), light had to be out, couldn't tolerate the flexeril, she would have a headache/episode 1-2x a year and took the muscle relaxers. Slowly progressed in frequency to 2x a month, she had menopause at mid 65s, gotten even worse, she has never slept well, headaches have evolved she still gets the stabbing pain and it may go to the right side, constant headache in the back of her head and neck, 4-5/10 daily headaches, at least 8-10 migraine days a month for the last year at least, can last 24-72 hours, unilateral and pounding/pulsating/throbbing with photo/phonophobia, nausea, no vomiting, movement makes it worse, very sensitive to smell, can trigger. Flexeril helps but can;t tolerate. Can be positionally worse, morning, No other focal neurologic deficits, associated symptoms, inciting events or modifiable factors.   Reviewed notes, labs and imaging from outside physicians, which showed:   Tsh/cbc/cmp  nml   CT Maxillofacial 2015: reviewed imaging The paranasal sinuses are clear without mucosal thickening or fluid.  The ostiomeatal complexes and sphenoethmoidal recesses are patent.  No osseous sinus wall thickening or dehiscence is identified.   Frontal sinus not well aerated.   Mild leftward deviation the nasal septum. Mastoid air cells are  clear. Orbits are unremarkable. Visualized portion of the brain is  unremarkable.   IMPRESSION:  Grossly unremarkable paranasal sinus CT.    From a thorough review of records, medications tried that can be used in migraine for headache management include: Tylenol, amitriptyline, baclofen, zonisamide, Decadron injections, doxepin, ketorolac injections, Medrol Dosepak, Reglan tablets, Zofran tabs and injections, Phenergan injections  Observations/Objective:  Generalized: Well developed, in no acute distress  Mentation: Alert oriented to time, place, history taking. Follows all commands speech and language fluent   Assessment and Plan:  46 y.o. year old female  has a past medical history of ALLERGIC RHINITIS, CHICKENPOX, HX OF, DEPRESSION, Heart murmur, HYPERLIPIDEMIA, IBS (irritable bowel syndrome), Iron deficiency anemia, Migraine, Obesity, Recurrent cold sores, Renal cyst, and Wears glasses. here with    ICD-10-CM   1. Migraine with aura and without status migrainosus, not intractable  G43.109       Maria is doing well, today. Migraines are very well managed on current regimen. We will continue Emgality monthly and zonisamide '200mg'$  daily for prevention and rizatriptan and baclofen for abortive therapy. She will continue focusing on healthy lifestyle habits. She will call if any records are needed to be sent to new neurologist in [place]. She will follow up with me in 1 year for refills if needed.   No orders of the defined types were placed in this encounter.   Meds ordered this encounter  Medications   zonisamide (ZONEGRAN) 100 MG capsule    Sig: Take 2 capsules (200 mg total) by mouth daily.    Dispense:  180 capsule    Refill:  3    Order Specific Question:   Supervising Provider    Answer:   Melvenia Beam [[id number]]   baclofen (LIORESAL) 10 MG tablet    Sig: TAKE 1  TABLET BY MOUTH TWICE A DAY AS NEEDED *LIMIT TO 1 TO 2 TREATMENTS PER WEEK*    Dispense:  20 tablet    Refill:  0    Order Specific Question:   Supervising Provider    Answer:   Melvenia Beam [[id number]]   rizatriptan (MAXALT-MLT) 10 MG disintegrating tablet    Sig: Take 1 tablet (10 mg total) by mouth as needed for migraine. May repeat in 2 hours if needed    Dispense:  9 tablet    Refill:  11    Order Specific Question:   Supervising Provider    Answer:   Melvenia Beam [[id number]]   Galcanezumab-gnlm (EMGALITY) 120 MG/ML SOAJ    Sig: Inject 120 mg into the skin every 30 (thirty) days.    Dispense:  3 mL    Refill:  3    Order Specific Question:   Supervising Provider    Answer:   Melvenia Beam [id number]     Follow Up Instructions:  I discussed the assessment and treatment plan with the patient. The patient was provided an opportunity to ask questions and all were answered. The patient agreed with the plan and demonstrated an understanding of the instructions.   The patient was advised to call back or seek an in-person evaluation if the symptoms worsen or  if the condition fails to improve as anticipated.  I provided 15 minutes of non-face-to-face time during this encounter. Patient located at their place of residence during [place] visit. Provider is in the office.    Debbora Presto, NP

## 2021-08-11 NOTE — Patient Instructions (Signed)
Below is our plan:  We will continue current treatment plan.   Please make sure you are staying well hydrated. I recommend 50-60 ounces daily. Well balanced diet and regular exercise encouraged. Consistent sleep schedule with 6-8 hours recommended.   Please continue follow up with care team as directed.   Follow up with me in 1 year if refills are needed.   You may receive a survey regarding today's visit. I encourage you to leave honest feed back as I do use this information to improve patient care. Thank you for seeing me today!

## 2021-10-19 ENCOUNTER — Telehealth: Payer: Self-pay | Admitting: *Deleted

## 2021-10-19 NOTE — Telephone Encounter (Signed)
Emgality [place] on Cover My Meds. Key: [id number]. Awaiting determination from [place].

## 2021-10-19 NOTE — Telephone Encounter (Signed)
[place] Emgality [place]  Approval dates 10/19/2021-10/20/2022

## 2022-03-03 ENCOUNTER — Telehealth: Payer: Self-pay | Admitting: Plastic Surgery

## 2022-03-03 NOTE — Telephone Encounter (Signed)
Pt called in to request an itemized bill for her Skinuva Cream x2 for 192.60 on 05/01/20.  This office unable to pull an itemized bill but will help to research to see if we can find one.  Unsure if we are able but will attempt.

## 2022-04-22 IMAGING — MG MM BREAST BX W LOC DEV 1ST LESION IMAGE BX SPEC STEREO GUIDE*L*
8 of 15 series · 8 of 31 positions shown · non-contrast
Comparison: Previous exams.
COMPARISON: Previous exams.

Addendum:
CLINICAL DATA: 45-year-old female for tissue sampling of posterior
UPPER LEFT breast asymmetry.

EXAM:
LEFT BREAST STEREOTACTIC CORE NEEDLE BIOPSY

[L (1 of 8)]
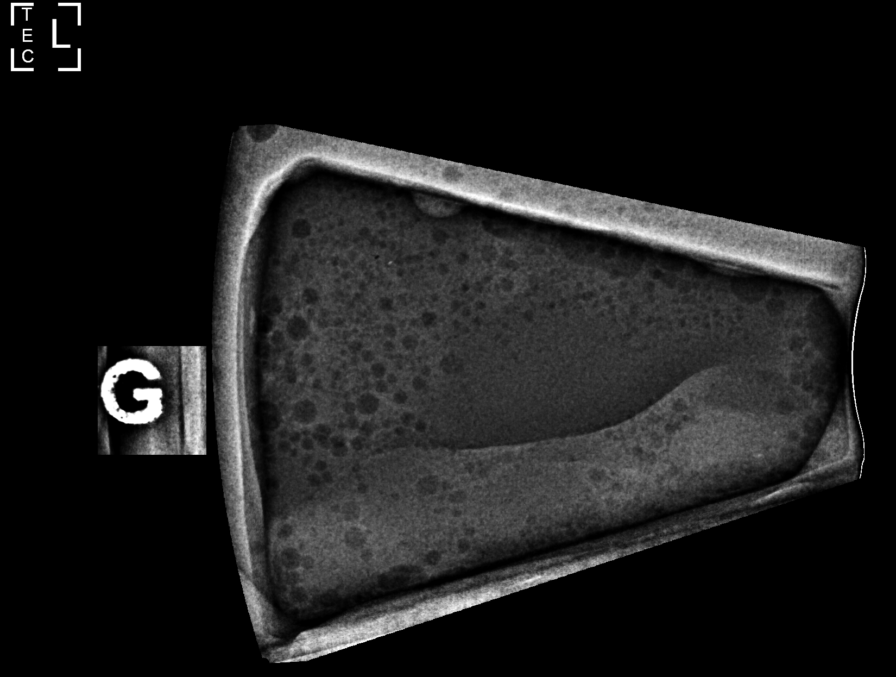

[L (2 of 8)]
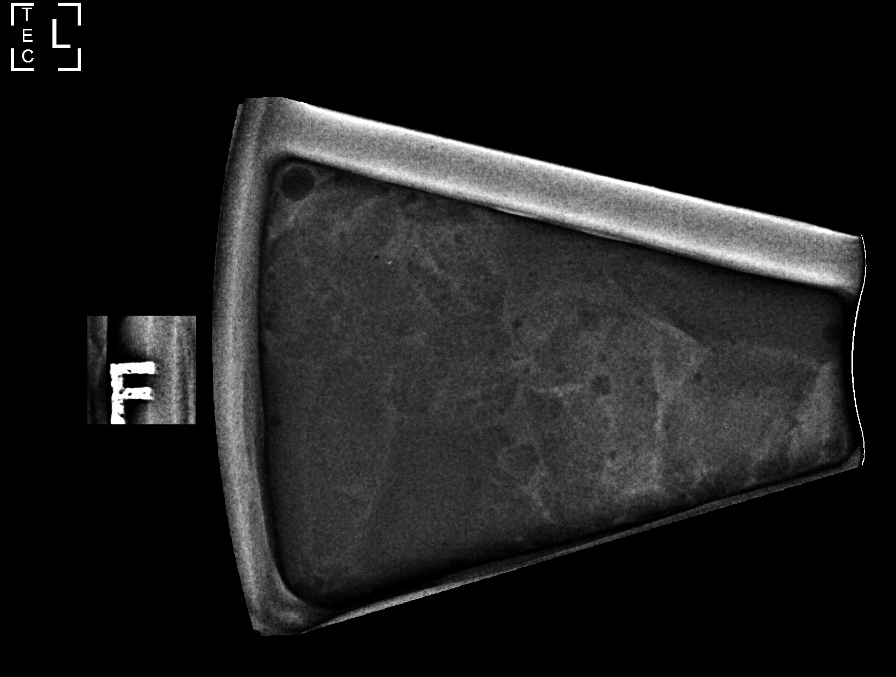

[L (3 of 8)]
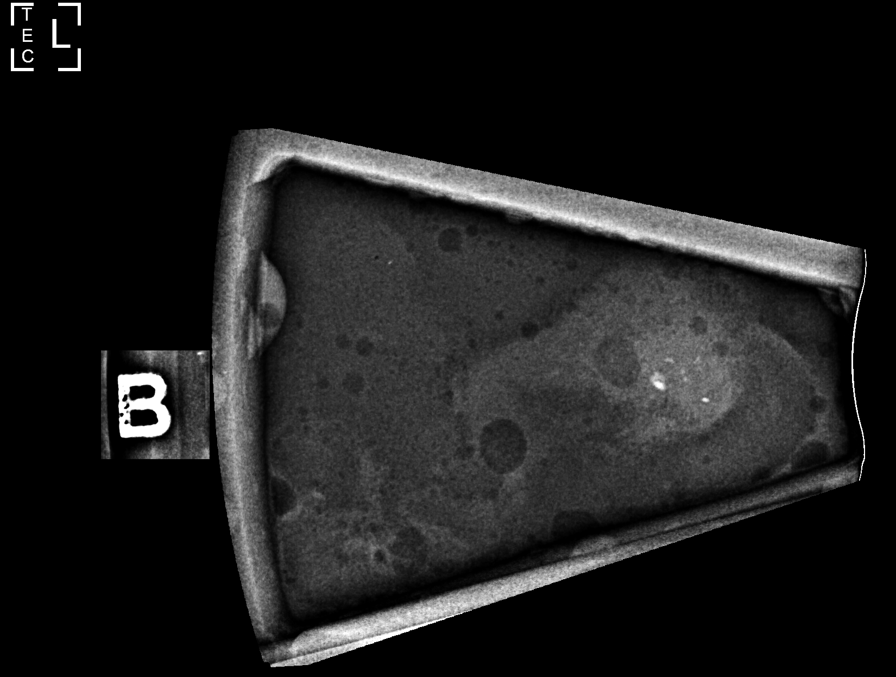

[L (4 of 8)]
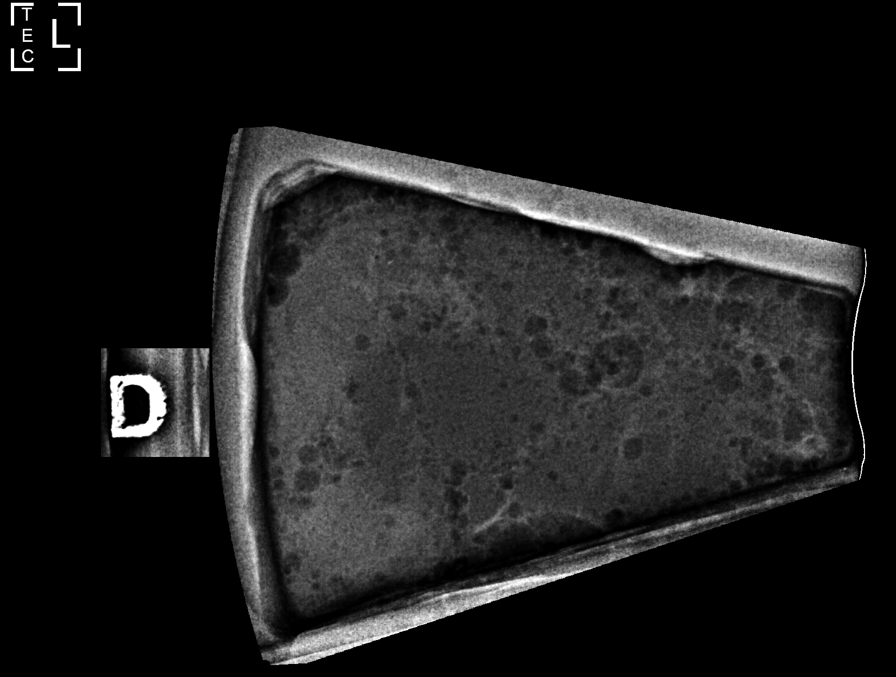

[L (5 of 8)]
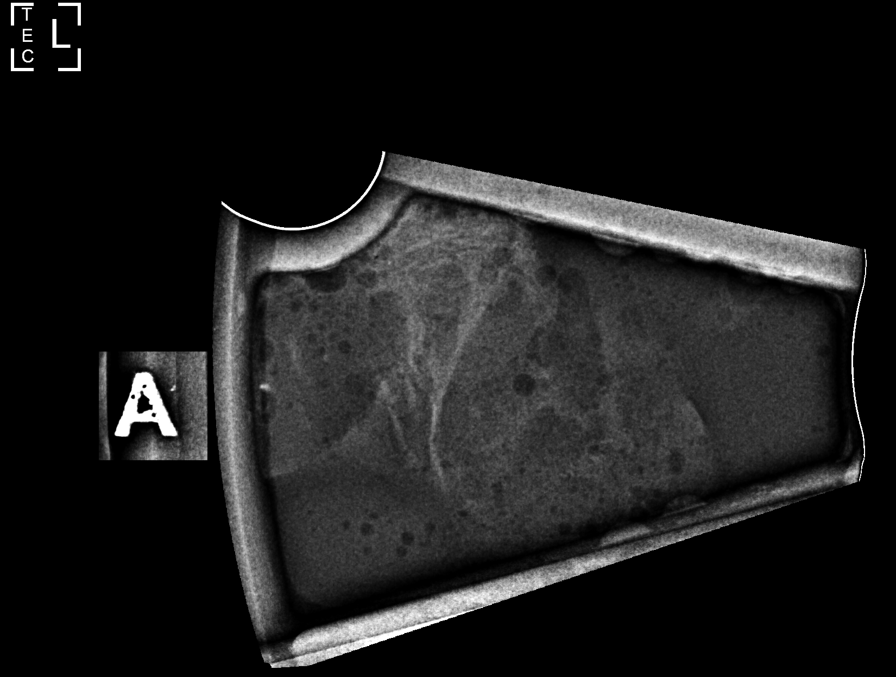

[L (6 of 8)]
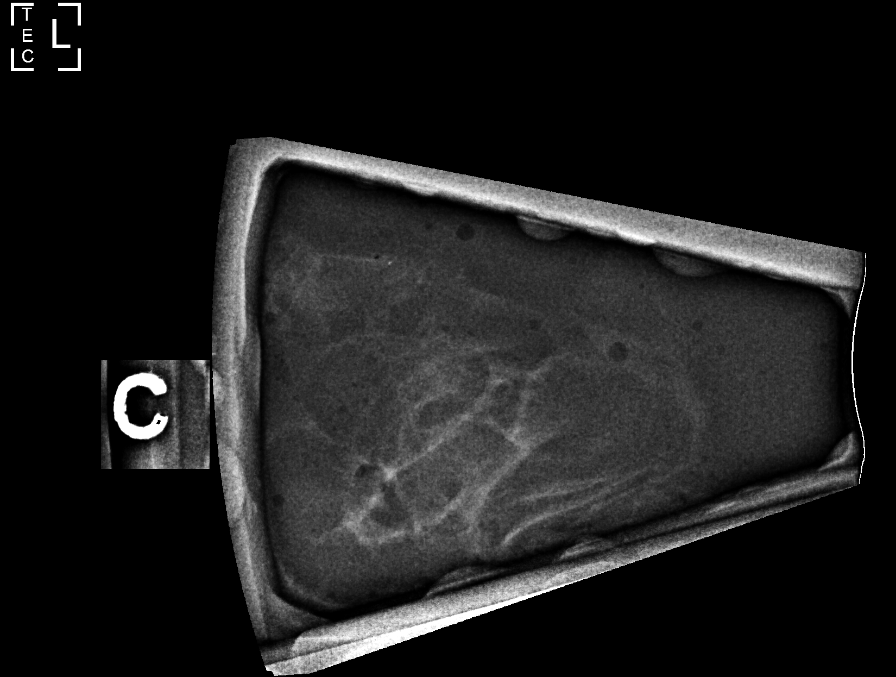

[L (7 of 8)]
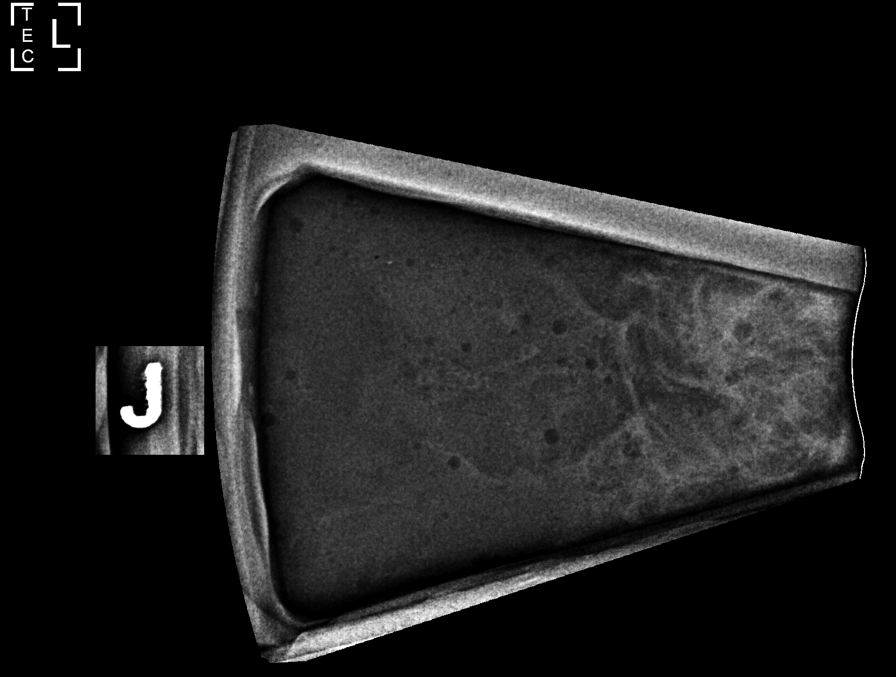

[L (8 of 8)]
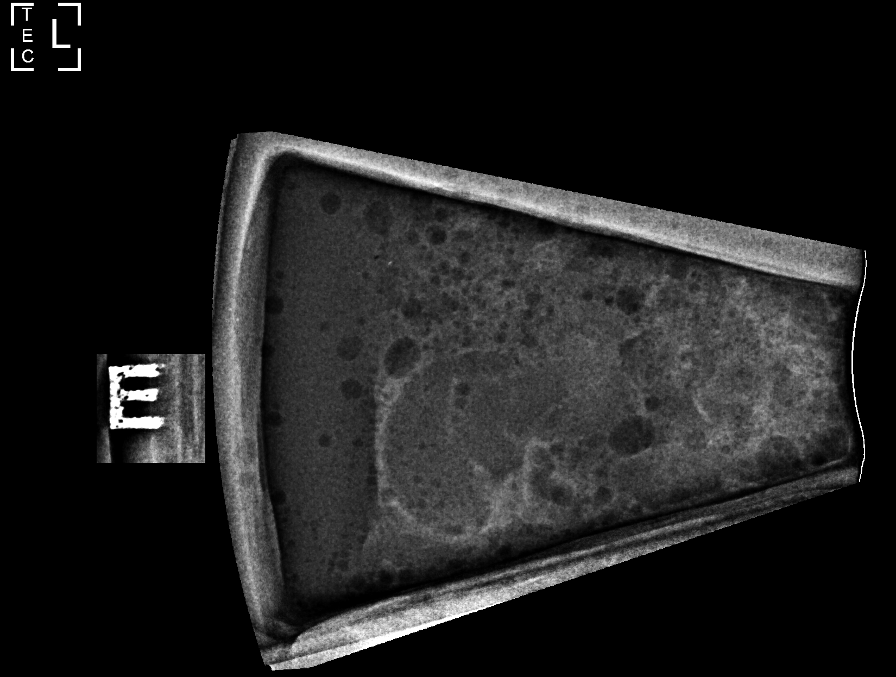

[8 of 31 positions shown; findings below may reference images not displayed]



Using sterile technique and 1% Lidocaine with and without
epinephrine as local anesthetic, under stereotactic guidance, a 9
gauge vacuum assisted device was used to perform core needle biopsy
of far posterior UPPER LEFT breast asymmetry using a LATERAL
approach. Specimen radiograph was performed.

At the conclusion of the procedure, a COIL shaped tissue marker clip
was deployed into the biopsy cavity. Follow-up 2-view mammogram was
performed and dictated separately.
IMPRESSION: Stereotactic-guided biopsy of posterior UPPER LEFT breast asymmetry.
No apparent complications.

ADDENDUM:
Pathology revealed FIBROADENOMATOID AND FIBROCYSTIC CHANGES WITH
APOCRINE METAPLASIA of the LEFT breast, posterior upper, (coil
clip). This was found to be concordant by Dr. Nenad Armstead.

Pathology results were discussed with the patient by telephone. The
patient reported doing well after the biopsy with tenderness at the
site. Post biopsy instructions and care were reviewed and questions
were answered. The patient was encouraged to call The [REDACTED]

The patient was instructed to return for annual screening
mammography.

Pathology results reported by Kalekidan Unes, RN on 02/10/2021.



Using sterile technique and 1% Lidocaine with and without
epinephrine as local anesthetic, under stereotactic guidance, a 9
gauge vacuum assisted device was used to perform core needle biopsy
of far posterior UPPER LEFT breast asymmetry using a LATERAL
approach. Specimen radiograph was performed.

At the conclusion of the procedure, a COIL shaped tissue marker clip
was deployed into the biopsy cavity. Follow-up 2-view mammogram was
performed and dictated separately.
IMPRESSION: Stereotactic-guided biopsy of posterior UPPER LEFT breast asymmetry.
No apparent complications.

## 2022-11-11 ENCOUNTER — Other Ambulatory Visit (HOSPITAL_COMMUNITY): Payer: Self-pay

## 2024-01-04 ENCOUNTER — Other Ambulatory Visit: Payer: Self-pay | Admitting: Medical Genetics

## 2024-03-19 ENCOUNTER — Other Ambulatory Visit (HOSPITAL_COMMUNITY)
